# Patient Record
Sex: Female | Born: 1960 | Race: White | Hispanic: No | Smoking: Never smoker
Health system: Southern US, Community
[De-identification: ages and names within clinical notes are randomized; demographics above are authoritative.]

## PROBLEM LIST (undated history)

## (undated) DIAGNOSIS — K589 Irritable bowel syndrome without diarrhea: Secondary | ICD-10-CM

## (undated) DIAGNOSIS — F32A Depression, unspecified: Secondary | ICD-10-CM

## (undated) DIAGNOSIS — N971 Female infertility of tubal origin: Secondary | ICD-10-CM

## (undated) DIAGNOSIS — N760 Acute vaginitis: Secondary | ICD-10-CM

## (undated) DIAGNOSIS — J302 Other seasonal allergic rhinitis: Secondary | ICD-10-CM

## (undated) DIAGNOSIS — F4310 Post-traumatic stress disorder, unspecified: Secondary | ICD-10-CM

## (undated) DIAGNOSIS — R519 Headache, unspecified: Secondary | ICD-10-CM

## (undated) DIAGNOSIS — F909 Attention-deficit hyperactivity disorder, unspecified type: Secondary | ICD-10-CM

## (undated) DIAGNOSIS — R51 Headache: Secondary | ICD-10-CM

## (undated) DIAGNOSIS — T7840XA Allergy, unspecified, initial encounter: Secondary | ICD-10-CM

## (undated) DIAGNOSIS — Z5189 Encounter for other specified aftercare: Secondary | ICD-10-CM

## (undated) DIAGNOSIS — F419 Anxiety disorder, unspecified: Secondary | ICD-10-CM

## (undated) DIAGNOSIS — F329 Major depressive disorder, single episode, unspecified: Secondary | ICD-10-CM

## (undated) DIAGNOSIS — B9689 Other specified bacterial agents as the cause of diseases classified elsewhere: Secondary | ICD-10-CM

## (undated) DIAGNOSIS — G2581 Restless legs syndrome: Secondary | ICD-10-CM

## (undated) DIAGNOSIS — G709 Myoneural disorder, unspecified: Secondary | ICD-10-CM

## (undated) HISTORY — PX: APPENDECTOMY: SHX54

## (undated) HISTORY — DX: Headache: R51

## (undated) HISTORY — DX: Anxiety disorder, unspecified: F41.9

## (undated) HISTORY — DX: Attention-deficit hyperactivity disorder, unspecified type: F90.9

## (undated) HISTORY — DX: Allergy, unspecified, initial encounter: T78.40XA

## (undated) HISTORY — PX: ABDOMINAL HYSTERECTOMY: SHX81

## (undated) HISTORY — DX: Other seasonal allergic rhinitis: J30.2

## (undated) HISTORY — DX: Other specified bacterial agents as the cause of diseases classified elsewhere: N76.0

## (undated) HISTORY — DX: Irritable bowel syndrome, unspecified: K58.9

## (undated) HISTORY — PX: BACK SURGERY: SHX140

## (undated) HISTORY — DX: Other specified bacterial agents as the cause of diseases classified elsewhere: B96.89

## (undated) HISTORY — DX: Headache, unspecified: R51.9

## (undated) HISTORY — PX: TUBAL LIGATION: SHX77

## (undated) HISTORY — DX: Major depressive disorder, single episode, unspecified: F32.9

## (undated) HISTORY — DX: Restless legs syndrome: G25.81

## (undated) HISTORY — DX: Depression, unspecified: F32.A

## (undated) HISTORY — DX: Encounter for other specified aftercare: Z51.89

## (undated) HISTORY — PX: HERNIA MESH REMOVAL: SHX1745

---

## 1999-03-02 ENCOUNTER — Encounter: Payer: Self-pay | Admitting: Emergency Medicine

## 1999-03-02 ENCOUNTER — Emergency Department (HOSPITAL_COMMUNITY): Admission: EM | Admit: 1999-03-02 | Discharge: 1999-03-02 | Payer: Self-pay | Admitting: Emergency Medicine

## 2001-07-28 ENCOUNTER — Encounter (HOSPITAL_COMMUNITY): Admission: RE | Admit: 2001-07-28 | Discharge: 2001-08-27 | Payer: Self-pay | Admitting: Preventative Medicine

## 2001-08-28 ENCOUNTER — Encounter: Payer: Self-pay | Admitting: Preventative Medicine

## 2001-08-28 ENCOUNTER — Ambulatory Visit (HOSPITAL_COMMUNITY): Admission: RE | Admit: 2001-08-28 | Discharge: 2001-08-28 | Payer: Self-pay | Admitting: Preventative Medicine

## 2001-11-16 ENCOUNTER — Emergency Department (HOSPITAL_COMMUNITY): Admission: EM | Admit: 2001-11-16 | Discharge: 2001-11-16 | Payer: Self-pay | Admitting: Emergency Medicine

## 2002-03-31 ENCOUNTER — Emergency Department (HOSPITAL_COMMUNITY): Admission: EM | Admit: 2002-03-31 | Discharge: 2002-03-31 | Payer: Self-pay | Admitting: Emergency Medicine

## 2002-08-23 ENCOUNTER — Encounter (HOSPITAL_COMMUNITY): Admission: RE | Admit: 2002-08-23 | Discharge: 2002-09-22 | Payer: Self-pay | Admitting: Preventative Medicine

## 2002-08-31 ENCOUNTER — Ambulatory Visit (HOSPITAL_COMMUNITY): Admission: RE | Admit: 2002-08-31 | Discharge: 2002-08-31 | Payer: Self-pay | Admitting: Preventative Medicine

## 2002-08-31 ENCOUNTER — Encounter: Payer: Self-pay | Admitting: Preventative Medicine

## 2005-07-08 ENCOUNTER — Emergency Department (HOSPITAL_COMMUNITY): Admission: EM | Admit: 2005-07-08 | Discharge: 2005-07-08 | Payer: Self-pay | Admitting: Emergency Medicine

## 2006-05-14 ENCOUNTER — Ambulatory Visit (HOSPITAL_COMMUNITY): Admission: RE | Admit: 2006-05-14 | Discharge: 2006-05-14 | Payer: Self-pay | Admitting: Internal Medicine

## 2006-06-03 ENCOUNTER — Encounter: Admission: RE | Admit: 2006-06-03 | Discharge: 2006-06-03 | Payer: Self-pay | Admitting: Internal Medicine

## 2006-10-06 ENCOUNTER — Emergency Department (HOSPITAL_COMMUNITY): Admission: EM | Admit: 2006-10-06 | Discharge: 2006-10-06 | Payer: Self-pay | Admitting: Emergency Medicine

## 2006-10-30 ENCOUNTER — Emergency Department (HOSPITAL_COMMUNITY): Admission: EM | Admit: 2006-10-30 | Discharge: 2006-10-30 | Payer: Self-pay | Admitting: Emergency Medicine

## 2006-11-19 ENCOUNTER — Encounter: Admission: RE | Admit: 2006-11-19 | Discharge: 2006-11-19 | Payer: Self-pay | Admitting: Internal Medicine

## 2007-04-21 ENCOUNTER — Other Ambulatory Visit: Admission: RE | Admit: 2007-04-21 | Discharge: 2007-04-21 | Payer: Self-pay | Admitting: Obstetrics and Gynecology

## 2007-09-14 ENCOUNTER — Encounter: Admission: RE | Admit: 2007-09-14 | Discharge: 2007-09-14 | Payer: Self-pay | Admitting: Internal Medicine

## 2008-02-05 ENCOUNTER — Emergency Department (HOSPITAL_COMMUNITY): Admission: EM | Admit: 2008-02-05 | Discharge: 2008-02-05 | Payer: Self-pay | Admitting: Emergency Medicine

## 2009-01-19 ENCOUNTER — Emergency Department (HOSPITAL_COMMUNITY): Admission: EM | Admit: 2009-01-19 | Discharge: 2009-01-19 | Payer: Self-pay | Admitting: Emergency Medicine

## 2010-05-27 ENCOUNTER — Encounter: Payer: Self-pay | Admitting: Internal Medicine

## 2012-06-01 ENCOUNTER — Encounter (HOSPITAL_BASED_OUTPATIENT_CLINIC_OR_DEPARTMENT_OTHER): Payer: Self-pay | Admitting: *Deleted

## 2012-06-03 NOTE — H&P (Signed)
[facility] [address]   [address] [place], [place] [address] [phone] A Division of [facility]  Loreta Ave, M.D.   Robert A. Thurston Hole, M.D.   Burnell Blanks, M.D.   Eulas Post, M.D.   Lunette Stands, M.D Buford Dresser, M.D.  Charlsie Quest, M.D.   Estell Harpin, M.D.   Melina Fiddler, M.D. Genene Churn. Barry Dienes, PA-C            Kirstin A. Shepperson, PA-C Josh [place], PA-C [place], [place]   RE: Christine Douglas, [place]                                [phone]      DOB: 11-07-1960 PROGRESS NOTE: 01-28-12 Azilee comes in for evaluation and treatment recommendation and consultation for continued ongoing symptoms with her left shoulder.  Seen and followed by Dr. Farris Has.  Sent to me today for evaluation and potential operative intervention for definitive treatment.  Patient has a history going on for almost ten years of off and on symptoms in her left shoulder.  Initially would come and go, responded to conservative treatment.  Her symptoms however have become more and more consistent and on a daily basis with more functional impact.  Starting to get some rest pain and night pain.  Initial treatment including injection helpful, but her last shot only helped for a very short time, but it was helpful with Marcaine in place.  She is left hand dominant.  She has had to stop lifting weights, playing tennis and doing almost everything with her right arm because of continued symptoms.  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 and this is starting to impact her work as well.  Her previous workup has been completed and I have reviewed all of this.  Her x-rays show a Type II acromion.  Reasonable subacromial space.  Good glenohumeral joint.  Some changes AC joint, not too extreme, but present.  Her MRI shows moderate tendinopathy rotator cuff with interstitial tearing, as well as articular surface tear involving the anterior attachment region  of the supraspinatus.  I don't see a full thickness tear.  Glenoid intact.  Picture of impingement.  All of these have been reviewed and discussed with her.   Her entire history is reviewed, updated and included in the chart, as is her general exam.  EXAMINATION: Specifically, this is a healthy appearing 52 year-old female.  I can still get her left shoulder through full motion, although she really does not like going overhead and internal rotation.  Positive impingement.  Positive palms down abduction.  Cuff irritation and weakness.  Biceps intact.  No apprehension or instability.  No demonstrable atrophy.  Neurovascularly intact distally.  Opposite right shoulder has full motion.  Good stability.  No cuff irritation.    X-RAYS: I did get a three view x-ray in our office today.  This again shows the Type 1-2 acromion.  No calcification or other significant findings.  Some changes AC joint.     DISPOSITION: Given the longstanding symptoms and worsening symptoms I think operative intervention is indicated.  This is something she wants to pursue.  Discussed exam under anesthesia, arthroscopy, decompression.  Possible rotator cuff tear depending on the condition of her rotator cuff.  Procedures, risks, benefits, complications and anticipated  postoperative course reviewed with her in detail and she understands.  If we have to fix her cuff this is obviously going to have more of an impact on her return to work.  I have discussed this with her as well and she understands.  I will see her at the time of operative intervention.      Loreta Ave, M.D.   Electronically verified by Loreta Ave, M.D. DFM:jjh D 01-29-12 T 01-31-12

## 2012-06-04 ENCOUNTER — Encounter (HOSPITAL_BASED_OUTPATIENT_CLINIC_OR_DEPARTMENT_OTHER)
Admission: RE | Disposition: A | Discharge status: Home / Self Care | Payer: Self-pay | Source: Ambulatory Visit | Attending: Orthopedic Surgery

## 2012-06-04 ENCOUNTER — Ambulatory Visit (HOSPITAL_BASED_OUTPATIENT_CLINIC_OR_DEPARTMENT_OTHER): Payer: Self-pay | Admitting: Anesthesiology

## 2012-06-04 ENCOUNTER — Encounter (HOSPITAL_BASED_OUTPATIENT_CLINIC_OR_DEPARTMENT_OTHER): Payer: Self-pay | Admitting: Anesthesiology

## 2012-06-04 ENCOUNTER — Encounter (HOSPITAL_BASED_OUTPATIENT_CLINIC_OR_DEPARTMENT_OTHER): Payer: Self-pay

## 2012-06-04 ENCOUNTER — Ambulatory Visit (HOSPITAL_BASED_OUTPATIENT_CLINIC_OR_DEPARTMENT_OTHER)
Admission: RE | Admit: 2012-06-04 | Discharge: 2012-06-04 | Disposition: A | Discharge status: Home / Self Care | Payer: Self-pay | Source: Ambulatory Visit | Attending: Orthopedic Surgery | Admitting: Orthopedic Surgery

## 2012-06-04 DIAGNOSIS — M949 Disorder of cartilage, unspecified: Secondary | ICD-10-CM | POA: Insufficient documentation

## 2012-06-04 DIAGNOSIS — M899 Disorder of bone, unspecified: Secondary | ICD-10-CM | POA: Insufficient documentation

## 2012-06-04 DIAGNOSIS — M25819 Other specified joint disorders, unspecified shoulder: Secondary | ICD-10-CM | POA: Insufficient documentation

## 2012-06-04 DIAGNOSIS — M75110 Incomplete rotator cuff tear or rupture of unspecified shoulder, not specified as traumatic: Secondary | ICD-10-CM | POA: Insufficient documentation

## 2012-06-04 DIAGNOSIS — Z9889 Other specified postprocedural states: Secondary | ICD-10-CM

## 2012-06-04 HISTORY — DX: Myoneural disorder, unspecified: G70.9

## 2012-06-04 HISTORY — PX: SHOULDER ARTHROSCOPY WITH ROTATOR CUFF REPAIR AND SUBACROMIAL DECOMPRESSION: SHX5686

## 2012-06-04 SURGERY — SHOULDER ARTHROSCOPY WITH ROTATOR CUFF REPAIR AND SUBACROMIAL DECOMPRESSION
Anesthesia: General | Site: Shoulder | Laterality: Left | Wound class: Clean

## 2012-06-04 MED ORDER — SUCCINYLCHOLINE CHLORIDE 20 MG/ML IJ SOLN
INTRAMUSCULAR | Status: DC | PRN
  Administered 2012-06-04: 100 mg via INTRAVENOUS

## 2012-06-04 MED ORDER — KETOROLAC TROMETHAMINE 30 MG/ML IJ SOLN
INTRAMUSCULAR | Status: DC | PRN
  Administered 2012-06-04: 30 mg via INTRAVENOUS

## 2012-06-04 MED ORDER — DEXAMETHASONE SODIUM PHOSPHATE 4 MG/ML IJ SOLN
INTRAMUSCULAR | Status: DC | PRN
  Administered 2012-06-04: 10 mg via INTRAVENOUS

## 2012-06-04 MED ORDER — MIDAZOLAM HCL 5 MG/5ML IJ SOLN
INTRAMUSCULAR | Status: DC | PRN
  Administered 2012-06-04: 2 mg via INTRAVENOUS

## 2012-06-04 MED ORDER — FENTANYL CITRATE 0.05 MG/ML IJ SOLN: 50.0000 ug | INTRAMUSCULAR | Status: DC | PRN

## 2012-06-04 MED ORDER — HYDROMORPHONE HCL PF 1 MG/ML IJ SOLN
0.2500 mg | INTRAMUSCULAR | Status: DC | PRN
  Administered 2012-06-04 (×4): 0.5 mg via INTRAVENOUS

## 2012-06-04 MED ORDER — CEFAZOLIN SODIUM-DEXTROSE 2-3 GM-% IV SOLR
2.0000 g | INTRAVENOUS | Status: AC
  Administered 2012-06-04: 2 g via INTRAVENOUS

## 2012-06-04 MED ORDER — OXYCODONE-ACETAMINOPHEN 5-325 MG PO TABS: 1.0000 | ORAL_TABLET | ORAL | Status: DC | PRN

## 2012-06-04 MED ORDER — FENTANYL CITRATE 0.05 MG/ML IJ SOLN
INTRAMUSCULAR | Status: DC | PRN
  Administered 2012-06-04: 50 ug via INTRAVENOUS
  Administered 2012-06-04: 100 ug via INTRAVENOUS
  Administered 2012-06-04 (×2): 25 ug via INTRAVENOUS

## 2012-06-04 MED ORDER — BUPIVACAINE HCL (PF) 0.5 % IJ SOLN
INTRAMUSCULAR | Status: DC | PRN
  Administered 2012-06-04: 7900 mL

## 2012-06-04 MED ORDER — MIDAZOLAM HCL 2 MG/2ML IJ SOLN: 1.0000 mg | INTRAMUSCULAR | Status: DC | PRN

## 2012-06-04 MED ORDER — PROPOFOL 10 MG/ML IV BOLUS
INTRAVENOUS | Status: DC | PRN
  Administered 2012-06-04: 150 mg via INTRAVENOUS

## 2012-06-04 MED ORDER — OXYCODONE HCL 5 MG PO TABS
5.0000 mg | ORAL_TABLET | Freq: Once | ORAL | Status: AC | PRN
  Administered 2012-06-04: 5 mg via ORAL

## 2012-06-04 MED ORDER — SODIUM CHLORIDE 0.9 % IR SOLN
Status: DC | PRN
  Administered 2012-06-04: 7900 mL

## 2012-06-04 MED ORDER — ONDANSETRON HCL 4 MG/2ML IJ SOLN: 4.0000 mg | Freq: Once | INTRAMUSCULAR | Status: DC | PRN

## 2012-06-04 MED ORDER — MEPERIDINE HCL 25 MG/ML IJ SOLN: 6.2500 mg | INTRAMUSCULAR | Status: DC | PRN

## 2012-06-04 MED ORDER — LIDOCAINE HCL (CARDIAC) 20 MG/ML IV SOLN
INTRAVENOUS | Status: DC | PRN
  Administered 2012-06-04: 60 mg via INTRAVENOUS

## 2012-06-04 MED ORDER — OXYCODONE HCL 5 MG/5ML PO SOLN: 5.0000 mg | Freq: Once | ORAL | Status: AC | PRN

## 2012-06-04 MED ORDER — ONDANSETRON HCL 4 MG/2ML IJ SOLN
INTRAMUSCULAR | Status: DC | PRN
  Administered 2012-06-04: 4 mg via INTRAVENOUS

## 2012-06-04 MED ORDER — ONDANSETRON 8 MG PO TBDP
8.0000 mg | ORAL_TABLET | Freq: Once | ORAL | Status: AC
  Administered 2012-06-04: 8 mg via ORAL

## 2012-06-04 MED ORDER — LACTATED RINGERS IV SOLN
INTRAVENOUS | Status: DC
  Administered 2012-06-04: 08:00:00 via INTRAVENOUS

## 2012-06-04 SURGICAL SUPPLY — 73 items
ANCH SUT SWLK 19.1X5.5 CLS EL (Anchor) ×2 IMPLANT
ANCHOR PEEK SWIVEL LOCK 5.5 (Anchor) ×2 IMPLANT
APL SKNCLS STERI-STRIP NONHPOA (GAUZE/BANDAGES/DRESSINGS)
BENZOIN TINCTURE PRP APPL 2/3 (GAUZE/BANDAGES/DRESSINGS) IMPLANT
BLADE CUTTER GATOR 3.5 (BLADE) ×2 IMPLANT
BLADE CUTTER MENIS 5.5 (BLADE) IMPLANT
BLADE GREAT WHITE 4.2 (BLADE) ×2 IMPLANT
BLADE SURG 15 STRL LF DISP TIS (BLADE) IMPLANT
BLADE SURG 15 STRL SS (BLADE)
BUR OVAL 6.0 (BURR) ×2 IMPLANT
CANISTER OMNI JUG 16 LITER (MISCELLANEOUS) ×2 IMPLANT
CANISTER SUCTION 2500CC (MISCELLANEOUS) IMPLANT
CANNULA DRY DOC 8X75 (CANNULA) ×1 IMPLANT
CANNULA TWIST IN 8.25X7CM (CANNULA) IMPLANT
CLOTH BEACON ORANGE TIMEOUT ST (SAFETY) ×2 IMPLANT
DECANTER SPIKE VIAL GLASS SM (MISCELLANEOUS) IMPLANT
DRAPE OEC MINIVIEW 54X84 (DRAPES) IMPLANT
DRAPE STERI 35X30 U-POUCH (DRAPES) ×2 IMPLANT
DRAPE U-SHAPE 47X51 STRL (DRAPES) ×2 IMPLANT
DRAPE U-SHAPE 76X120 STRL (DRAPES) ×4 IMPLANT
DRSG PAD ABDOMINAL 8X10 ST (GAUZE/BANDAGES/DRESSINGS) ×2 IMPLANT
DURAPREP 26ML APPLICATOR (WOUND CARE) ×2 IMPLANT
ELECT MENISCUS 165MM 90D (ELECTRODE) ×2 IMPLANT
ELECT NEEDLE TIP 2.8 STRL (NEEDLE) IMPLANT
ELECT REM PT RETURN 9FT ADLT (ELECTROSURGICAL) ×2
ELECTRODE REM PT RTRN 9FT ADLT (ELECTROSURGICAL) ×1 IMPLANT
GAUZE XEROFORM 1X8 LF (GAUZE/BANDAGES/DRESSINGS) ×2 IMPLANT
GLOVE BIOGEL M STRL SZ7.5 (GLOVE) ×1 IMPLANT
GLOVE BIOGEL PI IND STRL 8 (GLOVE) ×2 IMPLANT
GLOVE BIOGEL PI INDICATOR 8 (GLOVE) ×2
GLOVE ORTHO TXT STRL SZ7.5 (GLOVE) ×4 IMPLANT
GOWN PREVENTION PLUS XLARGE (GOWN DISPOSABLE) ×2 IMPLANT
GOWN STRL REIN 2XL XLG LVL4 (GOWN DISPOSABLE) ×2 IMPLANT
IV NS IRRIG 3000ML ARTHROMATIC (IV SOLUTION) ×5 IMPLANT
NDL SCORPION MULTI FIRE (NEEDLE) IMPLANT
NDL SUT 6 .5 CRC .975X.05 MAYO (NEEDLE) IMPLANT
NEEDLE MAYO TAPER (NEEDLE)
NEEDLE SCORPION MULTI FIRE (NEEDLE) ×2 IMPLANT
NS IRRIG 1000ML POUR BTL (IV SOLUTION) IMPLANT
PACK ARTHROSCOPY DSU (CUSTOM PROCEDURE TRAY) ×2 IMPLANT
PACK BASIN DAY SURGERY FS (CUSTOM PROCEDURE TRAY) ×2 IMPLANT
PASSER SUT SWANSON 36MM LOOP (INSTRUMENTS) IMPLANT
PENCIL BUTTON HOLSTER BLD 10FT (ELECTRODE) ×2 IMPLANT
SET ARTHROSCOPY TUBING (MISCELLANEOUS) ×2
SET ARTHROSCOPY TUBING LN (MISCELLANEOUS) ×1 IMPLANT
SLEEVE SCD COMPRESS KNEE MED (MISCELLANEOUS) IMPLANT
SLING ARM FOAM STRAP LRG (SOFTGOODS) IMPLANT
SLING ARM FOAM STRAP MED (SOFTGOODS) IMPLANT
SLING ARM FOAM STRAP XLG (SOFTGOODS) IMPLANT
SLING ARM IMMOBILIZER LRG (SOFTGOODS) IMPLANT
SLING ARM IMMOBILIZER MED (SOFTGOODS) ×1 IMPLANT
SPONGE GAUZE 4X4 12PLY (GAUZE/BANDAGES/DRESSINGS) ×4 IMPLANT
SPONGE LAP 4X18 X RAY DECT (DISPOSABLE) IMPLANT
STRIP CLOSURE SKIN 1/2X4 (GAUZE/BANDAGES/DRESSINGS) IMPLANT
SUCTION FRAZIER TIP 10 FR DISP (SUCTIONS) IMPLANT
SUT ETHIBOND 2 OS 4 DA (SUTURE) IMPLANT
SUT ETHILON 2 0 FS 18 (SUTURE) IMPLANT
SUT ETHILON 3 0 PS 1 (SUTURE) ×2 IMPLANT
SUT FIBERWIRE #2 38 T-5 BLUE (SUTURE)
SUT RETRIEVER MED (INSTRUMENTS) IMPLANT
SUT STEEL 4 (SUTURE) IMPLANT
SUT STEEL 5 (SUTURE) IMPLANT
SUT TIGER TAPE 7 IN WHITE (SUTURE) ×1 IMPLANT
SUT VIC AB 0 CT1 27 (SUTURE)
SUT VIC AB 0 CT1 27XBRD ANBCTR (SUTURE) IMPLANT
SUT VIC AB 2-0 SH 27 (SUTURE)
SUT VIC AB 2-0 SH 27XBRD (SUTURE) IMPLANT
SUT VIC AB 3-0 FS2 27 (SUTURE) IMPLANT
SUTURE FIBERWR #2 38 T-5 BLUE (SUTURE) IMPLANT
TAPE FIBER 2MM 7IN #2 BLUE (SUTURE) ×1 IMPLANT
TOWEL OR 17X24 6PK STRL BLUE (TOWEL DISPOSABLE) ×2 IMPLANT
WATER STERILE IRR 1000ML POUR (IV SOLUTION) ×2 IMPLANT
YANKAUER SUCT BULB TIP NO VENT (SUCTIONS) IMPLANT

## 2012-06-04 NOTE — Anesthesia Postprocedure Evaluation (Signed)
Anesthesia Post Note  Patient: Christine Douglas  Procedure(s) Performed: Procedure(s) (LRB): SHOULDER ARTHROSCOPY WITH ROTATOR CUFF REPAIR AND SUBACROMIAL DECOMPRESSION (Left)  Anesthesia type: general  Patient location: PACU  Post pain: Pain level controlled  Post assessment: Patient's Cardiovascular Status Stable  Last Vitals:  Filed Vitals:   06/04/12 1145  BP: 109/78  Pulse: 74  Temp: 36.6 C  Resp: 16    Post vital signs: Reviewed and stable  Level of consciousness: sedated  Complications: No apparent anesthesia complications

## 2012-06-04 NOTE — Brief Op Note (Signed)
06/04/2012  1:44 PM  PATIENT:  Gayla Doss  52 y.o. female  PRE-OPERATIVE DIAGNOSIS:  LEFT SHOULDER IMPINGEMENT SYNDROME, DEGENERATIVE ARTHRITIS -SHOULDER, A/C JOINT, COMPLETE RUPTURE OF ROTATOR CUFF  POST-OPERATIVE DIAGNOSIS:  Left Shoulder Impingement, Rotator Cuff Tear  PROCEDURE:  Procedure(s) (LRB) with comments: SHOULDER ARTHROSCOPY WITH ROTATOR CUFF REPAIR AND SUBACROMIAL DECOMPRESSION (Left) - LEFT ARTHROSCOPY SHOULDER DECOMPRESSION SUBACROMIAL PARTIAL ACROMIOPLASTY WITH CORACOACROMIAL RELEASE, DISTAL CLAVICULECTOMY,  ROTATOR CUFF REPAIR   SURGEON:  Surgeon(s) and Role:    * Loreta Ave, MD - Primary  PHYSICIAN ASSISTANT: Zonia Kief M   ANESTHESIA:   general  EBL:  Total I/O In: 2462 [P.O.:462; I.V.:2000] Out: -    SPECIMEN:  No Specimen  DISPOSITION OF SPECIMEN:  N/A  COUNTS:  YES  TOURNIQUET:  * No tourniquets in log *   PATIENT DISPOSITION:  PACU - hemodynamically stable.

## 2012-06-04 NOTE — Interval H&P Note (Signed)
History and Physical Interval Note:  06/04/2012 7:33 AM  Christine Douglas  has presented today for surgery, with the diagnosis of LEFT SHOULDER IMPINGEMENT SYNDROME, DEGENERATIVE ARTHRITIS -SHOULDER, A/C JOINT, COMPLETE RUPTURE OF ROTATOR CUFF  The various methods of treatment have been discussed with the patient and family. After consideration of risks, benefits and other options for treatment, the patient has consented to  Procedure(s) (LRB) with comments: SHOULDER ARTHROSCOPY WITH ROTATOR CUFF REPAIR AND SUBACROMIAL DECOMPRESSION (Left) - LEFT ARTHROSCOPY SHOULDER DECOMPRESSION SUBACROMIAL PARTIAL ACROMIOPLASTY WITH CORACOACROMIAL RELEASE, ARTHROSCOPY SHOULDER DISTAL CLAVICULECTOMY, ARTHROSCOPY SHOULDER WITH ROTATOR CUFF REPAIR ANESTHESIA: GENERAL, PRE/POST OP SCALENE as a surgical intervention .  The patient's history has been reviewed, patient examined, no change in status, stable for surgery.  I have reviewed the patient's chart and labs.  Questions were answered to the patient's satisfaction.     Blayke Pinera F

## 2012-06-04 NOTE — Anesthesia Procedure Notes (Signed)
Procedure Name: Intubation Performed by: Zachery Niswander W Pre-anesthesia Checklist: Patient identified, Timeout performed, Emergency Drugs available, Suction available and Patient being monitored Patient Re-evaluated:Patient Re-evaluated prior to inductionOxygen Delivery Method: Circle system utilized Preoxygenation: Pre-oxygenation with 100% oxygen Intubation Type: IV induction Ventilation: Mask ventilation without difficulty Laryngoscope Size: Miller and 2 Grade View: Grade I Tube type: Oral Tube size: 7.0 mm Number of attempts: 1 Airway Equipment and Method: Stylet Placement Confirmation: ETT inserted through vocal cords under direct vision,  breath sounds checked- equal and bilateral and positive ETCO2 Secured at: 22 cm Tube secured with: Tape Dental Injury: Teeth and Oropharynx as per pre-operative assessment      

## 2012-06-04 NOTE — Transfer of Care (Signed)
Immediate Anesthesia Transfer of Care Note  Patient: Elainna P Brown  Procedure(s) Performed: Procedure(s) (LRB) with comments: SHOULDER ARTHROSCOPY WITH ROTATOR CUFF REPAIR AND SUBACROMIAL DECOMPRESSION (Left) - LEFT ARTHROSCOPY SHOULDER DECOMPRESSION SUBACROMIAL PARTIAL ACROMIOPLASTY WITH CORACOACROMIAL RELEASE, DISTAL CLAVICULECTOMY,  ROTATOR CUFF REPAIR   Patient Location: PACU  Anesthesia Type:General  Level of Consciousness: awake, alert  and oriented  Airway & Oxygen Therapy: Patient Spontanous Breathing and Patient connected to face mask oxygen  Post-op Assessment: Report given to PACU RN and Post -op Vital signs reviewed and stable  Post vital signs: Reviewed and stable  Complications: No apparent anesthesia complications

## 2012-06-04 NOTE — Anesthesia Preprocedure Evaluation (Addendum)
Anesthesia Evaluation  Patient identified by MRN, date of birth, ID band Patient awake    Reviewed: Allergy & Precautions, H&P , NPO status , Patient's Chart, lab work & pertinent test results  Airway Mallampati: I TM Distance: >3 FB Neck ROM: Full    Dental   Pulmonary          Cardiovascular     Neuro/Psych    GI/Hepatic   Endo/Other    Renal/GU      Musculoskeletal   Abdominal   Peds  Hematology   Anesthesia Other Findings   Reproductive/Obstetrics                           Anesthesia Physical Anesthesia Plan  ASA: II  Anesthesia Plan: General   Post-op Pain Management:    Induction: Intravenous  Airway Management Planned: Oral ETT  Additional Equipment:   Intra-op Plan:   Post-operative Plan: Extubation in OR  Informed Consent: I have reviewed the patients History and Physical, chart, labs and discussed the procedure including the risks, benefits and alternatives for the proposed anesthesia with the patient or authorized representative who has indicated his/her understanding and acceptance.     Plan Discussed with: CRNA and Surgeon  Anesthesia Plan Comments: (Pt preferred no ISB. Will proceed with GA and IV pain control.)       Anesthesia Quick Evaluation

## 2012-06-05 ENCOUNTER — Encounter (HOSPITAL_BASED_OUTPATIENT_CLINIC_OR_DEPARTMENT_OTHER): Payer: Self-pay | Admitting: Orthopedic Surgery

## 2012-06-05 NOTE — Op Note (Signed)
NAMESOLIYANA, MCCHRISTIAN NO.:  [id number]  MEDICAL RECORD NO.:  [id number]  LOCATION:                                 FACILITY:  PHYSICIAN:  Loreta Ave, M.D. DATE OF BIRTH:  1961-04-29  DATE OF PROCEDURE:  06/04/2012 DATE OF DISCHARGE:                              OPERATIVE REPORT   PREOPERATIVE DIAGNOSES:  Left shoulder impingement, distal clavicle osteolysis, partial rotator cuff tear.  POSTOPERATIVE DIAGNOSIS:  Left shoulder impingement, distal clavicle osteolysis, partial rotator cuff tear with functional full-thickness tear supraspinatus tendon.  Tearing superior labrum.  PROCEDURE:  Left shoulder exam under anesthesia, arthroscopy. Debridement labrum.  Debrided mobilization rotator cuff.  Arthroscopic- assisted rotator cuff repair, FiberWire x2, swivel lock anchors x2. Bursectomy, acromioplasty, CA ligament release.  Excision of distal clavicle.  SURGEON:  Loreta Ave, MD  ASSISTANT:  Genene Churn. Barry Dienes, PA  ANESTHESIA:  General.  BLOOD LOSS:  Minimal.  SPECIMEN:  None.  CULTURES:  None.  COMPLICATIONS:  None.  DRESSINGS:  Soft compressive shoulder immobilizer.  PROCEDURE:  The patient brought to the operating room, placed on the operating table in supine position.  After adequate anesthesia had been obtained, left shoulder examined.  Full motion and stable shoulder. Placed in beach-chair position on the shoulder positioner, prepped and draped in usual sterile fashion.  Three portals, anterior, posterior, lateral.  Arthroscope introduced, shoulder distended and inspected. Some fraying of superior labrum debrided.  Biceps tendon, biceps anchor intact.  Articular cartilage looked good.  Undersurface supraspinatus did not have an obvious tear but there was hyperemia throughout the crescent region.  Cannula redirected subacromially.  Functional full- thickness tear, entire supraspinatus crescent region, down to the capsule.  Mobilized and  debrided for repair.  Tuberosity roughened. Type 2 acromion.  Acromioplasty to a type 1 acromion releasing CA ligament.  Bursa resected.  Grade 4 changes of AC joint.  Periarticular spurs lateral cm of clavicle resected.  With a cannula in the lateral portal, the cuff was captured with 2 horizontal mattress sutures with scorpion device.  Anchored down the tuberosity with 2 SwiveLock anchors. Nice firm watertight closure achieved.  Adequacy of decompression and cuff repair confirmed viewing from all portals.  Instruments and fluids removed.  Portals closed with nylon.  Sterile compressive dressing applied.  Shoulder mobilizer applied.  Anesthesia reversed.  Brought to the recovery room.  Tolerated surgery well.  No complications.     Loreta Ave, M.D.     DFM/MEDQ  D:  06/04/2012  T:  06/04/2012  Job:  [id number]

## 2012-09-04 ENCOUNTER — Other Ambulatory Visit: Payer: Self-pay | Admitting: Neurological Surgery

## 2012-09-04 DIAGNOSIS — M5412 Radiculopathy, cervical region: Secondary | ICD-10-CM

## 2012-09-07 ENCOUNTER — Ambulatory Visit
Admission: RE | Admit: 2012-09-07 | Discharge: 2012-09-07 | Disposition: A | Discharge status: Home / Self Care | Payer: BC Managed Care – PPO | Source: Ambulatory Visit | Attending: Neurological Surgery | Admitting: Neurological Surgery

## 2012-09-07 DIAGNOSIS — M5412 Radiculopathy, cervical region: Secondary | ICD-10-CM

## 2012-10-30 ENCOUNTER — Encounter: Payer: Self-pay | Admitting: *Deleted

## 2012-11-02 ENCOUNTER — Ambulatory Visit (INDEPENDENT_AMBULATORY_CARE_PROVIDER_SITE_OTHER): Payer: BC Managed Care – PPO | Admitting: Adult Health

## 2012-11-02 ENCOUNTER — Encounter: Payer: Self-pay | Admitting: Adult Health

## 2012-11-02 VITALS — BP 120/70 | Ht 64.0 in | Wt 116.0 lb

## 2012-11-02 DIAGNOSIS — N898 Other specified noninflammatory disorders of vagina: Secondary | ICD-10-CM

## 2012-11-02 DIAGNOSIS — N760 Acute vaginitis: Secondary | ICD-10-CM

## 2012-11-02 DIAGNOSIS — A499 Bacterial infection, unspecified: Secondary | ICD-10-CM

## 2012-11-02 DIAGNOSIS — B9689 Other specified bacterial agents as the cause of diseases classified elsewhere: Secondary | ICD-10-CM

## 2012-11-02 LAB — POCT WET PREP (WET MOUNT)

## 2012-11-02 MED ORDER — METRONIDAZOLE 0.75 % VA GEL: VAGINAL | Status: DC

## 2012-11-02 NOTE — Progress Notes (Signed)
Subjective:     Patient ID: Christine Douglas, female   DOB: 11/25/1960, 52 y.o.   MRN: [id number]  HPI Christine Douglas is a 52 year old white female complaining of vaginal discharge and odor and some discomfort with sex.  Review of Systems Positives in HPI Reviewed past medical,surgical, social and family history. Reviewed medications and allergies.     Objective:   Physical Exam BP 120/70  Ht 5\' 4"  (1.626 m)  Wt 116 lb (52.617 kg)  BMI 19.9 kg/m2 Skin warm and dry.Pelvic: external genitalia is normal in appearance, vagina: white discharge without odor, cervix and uterus absent, adnexa: no masses or tenderness noted. Wet prep: + for clue cells and +WBCs. GC/CHL obtained.      Assessment:     BV    Plan:      Rx Metrogel 1 applicator in vagina at hs x 5 nites and prn with 1 refill   Follow up with physical in 4 weeks Review handout on BV

## 2012-11-02 NOTE — Patient Instructions (Addendum)
Bacterial Vaginosis Bacterial vaginosis (BV) is a vaginal infection where the normal balance of bacteria in the vagina is disrupted. The normal balance is then replaced by an overgrowth of certain bacteria. There are several different kinds of bacteria that can cause BV. BV is the most common vaginal infection in women of childbearing age. CAUSES   The cause of BV is not fully understood. BV develops when there is an increase or imbalance of harmful bacteria.  Some activities or behaviors can upset the normal balance of bacteria in the vagina and put women at increased risk including:  Having a new sex partner or multiple sex partners.  Douching.  Using an intrauterine device (IUD) for contraception.  It is not clear what role sexual activity plays in the development of BV. However, women that have never had sexual intercourse are rarely infected with BV. Women do not get BV from toilet seats, bedding, swimming pools or from touching objects around them.  SYMPTOMS   Grey vaginal discharge.  A fish-like odor with discharge, especially after sexual intercourse.  Itching or burning of the vagina and vulva.  Burning or pain with urination.  Some women have no signs or symptoms at all. DIAGNOSIS  Your caregiver must examine the vagina for signs of BV. Your caregiver will perform lab tests and look at the sample of vaginal fluid through a microscope. They will look for bacteria and abnormal cells (clue cells), a pH test higher than 4.5, and a positive amine test all associated with BV.  RISKS AND COMPLICATIONS   Pelvic inflammatory disease (PID).  Infections following gynecology surgery.  Developing HIV.  Developing herpes virus. TREATMENT  Sometimes BV will clear up without treatment. However, all women with symptoms of BV should be treated to avoid complications, especially if gynecology surgery is planned. Female partners generally do not need to be treated. However, BV may spread  between female sex partners so treatment is helpful in preventing a recurrence of BV.   BV may be treated with antibiotics. The antibiotics come in either pill or vaginal cream forms. Either can be used with nonpregnant or pregnant women, but the recommended dosages differ. These antibiotics are not harmful to the baby.  BV can recur after treatment. If this happens, a second round of antibiotics will often be prescribed.  Treatment is important for pregnant women. If not treated, BV can cause a premature delivery, especially for a pregnant woman who had a premature birth in the past. All pregnant women who have symptoms of BV should be checked and treated.  For chronic reoccurrence of BV, treatment with a type of prescribed gel vaginally twice a week is helpful. HOME CARE INSTRUCTIONS   Finish all medication as directed by your caregiver.  Do not have sex until treatment is completed.  Tell your sexual partner that you have a vaginal infection. They should see their caregiver and be treated if they have problems, such as a mild rash or itching.  Practice safe sex. Use condoms. Only have 1 sex partner. PREVENTION  Basic prevention steps can help reduce the risk of upsetting the natural balance of bacteria in the vagina and developing BV:  Do not have sexual intercourse (be abstinent).  Do not douche.  Use all of the medicine prescribed for treatment of BV, even if the signs and symptoms go away.  Tell your sex partner if you have BV. That way, they can be treated, if needed, to prevent reoccurrence. SEEK MEDICAL CARE IF:     Your symptoms are not improving after 3 days of treatment.  You have increased discharge, pain, or fever. MAKE SURE YOU:   Understand these instructions.  Will watch your condition.  Will get help right away if you are not doing well or get worse. FOR MORE INFORMATION  Division of STD Prevention (DSTDP), Centers for Disease Control and Prevention:  [employer]Apps.co.za American Social Health Association (ASHA): www.ashastd.org  Document Released: 04/22/2005 Document Revised: 07/15/2011 Document Reviewed: 10/13/2008 [facility] Patient Information 2014 [place], [place]. Return in 4 weeks for physical

## 2012-11-03 LAB — GC/CHLAMYDIA PROBE AMP
CT Probe RNA: NEGATIVE
GC Probe RNA: NEGATIVE

## 2012-12-04 ENCOUNTER — Other Ambulatory Visit: Payer: BC Managed Care – PPO | Admitting: Adult Health

## 2013-01-25 ENCOUNTER — Encounter: Payer: Self-pay | Admitting: *Deleted

## 2013-01-25 ENCOUNTER — Other Ambulatory Visit: Payer: BC Managed Care – PPO | Admitting: Adult Health

## 2013-01-26 ENCOUNTER — Encounter (HOSPITAL_COMMUNITY): Payer: Self-pay

## 2013-01-26 ENCOUNTER — Emergency Department (HOSPITAL_COMMUNITY)
Admission: EM | Admit: 2013-01-26 | Discharge: 2013-01-26 | Disposition: A | Discharge status: Home / Self Care | Payer: BC Managed Care – PPO | Attending: Emergency Medicine | Admitting: Emergency Medicine

## 2013-01-26 DIAGNOSIS — Z8742 Personal history of other diseases of the female genital tract: Secondary | ICD-10-CM | POA: Insufficient documentation

## 2013-01-26 DIAGNOSIS — Z8669 Personal history of other diseases of the nervous system and sense organs: Secondary | ICD-10-CM | POA: Insufficient documentation

## 2013-01-26 DIAGNOSIS — N3000 Acute cystitis without hematuria: Secondary | ICD-10-CM | POA: Insufficient documentation

## 2013-01-26 DIAGNOSIS — Z8619 Personal history of other infectious and parasitic diseases: Secondary | ICD-10-CM | POA: Insufficient documentation

## 2013-01-26 DIAGNOSIS — Z79899 Other long term (current) drug therapy: Secondary | ICD-10-CM | POA: Insufficient documentation

## 2013-01-26 LAB — URINALYSIS, ROUTINE W REFLEX MICROSCOPIC
Bilirubin Urine: NEGATIVE
Ketones, ur: NEGATIVE mg/dL
Nitrite: POSITIVE — AB
Urobilinogen, UA: 0.2 mg/dL (ref 0.0–1.0)

## 2013-01-26 MED ORDER — SULFAMETHOXAZOLE-TRIMETHOPRIM 800-160 MG PO TABS: 1.0000 | ORAL_TABLET | Freq: Two times a day (BID) | ORAL | Status: DC

## 2013-01-26 NOTE — ED Notes (Signed)
Pt c/o pressure and burning with urination x 2 days.  Denies abd pain.

## 2013-01-28 LAB — URINE CULTURE

## 2013-01-28 NOTE — ED Provider Notes (Signed)
CSN: [id number]     Arrival date & time 01/26/13  1052 History   First MD Initiated Contact with Patient 01/26/13 1122     Chief Complaint  Patient presents with  . Urinary Tract Infection   (Consider location/radiation/quality/duration/timing/severity/associated sxs/prior Treatment) HPI Comments: Christine Douglas is a 52 y.o. Female presenting with a 2 day history of suprapubic pressure, burning pain with urination and increased frequency of urination.  She has a history of uti's but not in recent years but suspects this is the problem.  She denies fevers or chills, nausea, vomiting, back or abdominal pain and has no vaginal discharge.  She has started taking an otc pyridium product which has not relieved her symptoms.     The history is provided by the patient.    Past Medical History  Diagnosis Date  . Neuromuscular disorder     nerve damage C6  C7  . BV (bacterial vaginosis)    Past Surgical History  Procedure Laterality Date  . Cesarean section    . Abdominal hysterectomy      partial hysterectomy  . Shoulder arthroscopy with rotator cuff repair and subacromial decompression  06/04/2012    Procedure: SHOULDER ARTHROSCOPY WITH ROTATOR CUFF REPAIR AND SUBACROMIAL DECOMPRESSION;  Surgeon: Loreta Ave, MD;  Location: [facility];  Service: Orthopedics;  Laterality: Left;  LEFT ARTHROSCOPY SHOULDER DECOMPRESSION SUBACROMIAL PARTIAL ACROMIOPLASTY WITH CORACOACROMIAL RELEASE, DISTAL CLAVICULECTOMY,  ROTATOR CUFF REPAIR    Family History  Problem Relation Age of Onset  . Cancer Other   . Thyroid disease Other    History  Substance Use Topics  . Smoking status: Never Smoker   . Smokeless tobacco: Never Used  . Alcohol Use: Yes     Comment: wine occassionally   OB History   Grav Para Term Preterm Abortions TAB SAB Ect Mult Living   3 3        3      Review of Systems  Constitutional: Negative for fever and chills.  HENT: Negative for congestion, sore throat  and neck pain.   Eyes: Negative.   Respiratory: Negative for chest tightness and shortness of breath.   Cardiovascular: Negative for chest pain.  Gastrointestinal: Negative for nausea and abdominal pain.  Genitourinary: Positive for dysuria, urgency and frequency. Negative for hematuria, flank pain, vaginal discharge, menstrual problem and pelvic pain.  Musculoskeletal: Negative for joint swelling and arthralgias.  Skin: Negative.  Negative for rash and wound.  Neurological: Negative for dizziness, weakness, light-headedness, numbness and headaches.  Psychiatric/Behavioral: Negative.     Allergies  Flexeril  Home Medications   Current Outpatient Rx  Name  Route  Sig  Dispense  Refill  . acetaminophen (TYLENOL) 500 MG tablet   Oral   Take 1,000 mg by mouth every 6 (six) hours as needed for pain.         . Cholecalciferol (VITAMIN D PO)   Oral   Take 1 tablet by mouth daily.         . diazepam (VALIUM) 5 MG tablet   Oral   Take 5 mg by mouth daily as needed for anxiety.         Marland Kitchen HYDROcodone-acetaminophen (NORCO) 10-325 MG per tablet   Oral   Take 1 tablet by mouth daily as needed for pain.         Marland Kitchen sulfamethoxazole-trimethoprim (SEPTRA DS) 800-160 MG per tablet   Oral   Take 1 tablet by mouth every 12 (twelve) hours.  10 tablet   0    BP 111/84  Pulse 64  Temp(Src) 97.7 F (36.5 C) (Oral)  Resp 18  Ht 5\' 4"  (1.626 m)  Wt 110 lb (49.896 kg)  BMI 18.87 kg/m2  SpO2 100% Physical Exam  Nursing note and vitals reviewed. Constitutional: She appears well-developed and well-nourished.  HENT:  Head: Normocephalic and atraumatic.  Eyes: Conjunctivae are normal.  Neck: Normal range of motion.  Cardiovascular: Normal rate, regular rhythm, normal heart sounds and intact distal pulses.   Pulmonary/Chest: Effort normal and breath sounds normal. She has no wheezes.  Abdominal: Soft. Bowel sounds are normal. She exhibits no distension. There is no tenderness.   Musculoskeletal: Normal range of motion.  Neurological: She is alert.  Skin: Skin is warm and dry.  Psychiatric: She has a normal mood and affect.    ED Course  Procedures (including critical care time) Labs Review Labs Reviewed  URINALYSIS, ROUTINE W REFLEX MICROSCOPIC - Abnormal; Notable for the following:    APPearance HAZY (*)    Hgb urine dipstick LARGE (*)    Protein, ur 100 (*)    Nitrite POSITIVE (*)    Leukocytes, UA LARGE (*)    All other components within normal limits  URINE MICROSCOPIC-ADD ON - Abnormal; Notable for the following:    Bacteria, UA MANY (*)    All other components within normal limits  URINE CULTURE   Imaging Review No results found.  MDM   1. Cystitis, acute    Patients labs and/or radiological studies were viewed and considered during the medical decision making and disposition process. Urine cx sent.  Pt prescribed septra, encouraged increased fluid intake.  May continue taking pyridium.  Encouraged recheck for fever, chills, vomiting, worse pain.  Lab visit with pcp after abx complete to ensure infection resolved.  The patient appears reasonably screened and/or stabilized for discharge and I doubt any other medical condition or other [facility] requiring further screening, evaluation, or treatment in the ED at this time prior to discharge.     Burgess Amor, PA-C 01/28/13 2123

## 2013-01-29 ENCOUNTER — Telehealth (HOSPITAL_COMMUNITY): Payer: Self-pay | Admitting: Emergency Medicine

## 2013-01-29 NOTE — ED Notes (Signed)
Post ED Visit - Positive Culture Follow-up  Culture report reviewed by antimicrobial stewardship pharmacist: []  Christine Douglas, Pharm.D., BCPS [x]  Christine Douglas, [address].D., BCPS []  Christine Douglas, Pharm.D., BCPS []  Christine Douglas, Christine Douglas.D., BCPS, AAHIVP []  Christine Douglas, Pharm.D., BCPS, AAHIVP  Positive urine culture Treated with Sulfa-Trimeth, organism sensitive to the same and no further patient follow-up is required at this time.  [name] 01/29/2013, 1:50 PM

## 2013-02-01 NOTE — ED Provider Notes (Signed)
Medical screening examination/treatment/procedure(s) were performed by non-physician practitioner and as supervising physician I was immediately available for consultation/collaboration.   Kristen N Ward, DO 02/01/13 0656 

## 2013-11-24 ENCOUNTER — Encounter: Payer: Self-pay | Admitting: Advanced Practice Midwife

## 2013-11-24 ENCOUNTER — Ambulatory Visit (INDEPENDENT_AMBULATORY_CARE_PROVIDER_SITE_OTHER): Payer: BC Managed Care – PPO | Admitting: Advanced Practice Midwife

## 2013-11-24 VITALS — BP 110/78 | Ht 64.0 in | Wt 129.0 lb

## 2013-11-24 DIAGNOSIS — B9689 Other specified bacterial agents as the cause of diseases classified elsewhere: Secondary | ICD-10-CM

## 2013-11-24 DIAGNOSIS — N760 Acute vaginitis: Secondary | ICD-10-CM

## 2013-11-24 DIAGNOSIS — A499 Bacterial infection, unspecified: Secondary | ICD-10-CM

## 2013-11-24 MED ORDER — METRONIDAZOLE 500 MG PO TABS: 500.0000 mg | ORAL_TABLET | Freq: Two times a day (BID) | ORAL | Status: DC

## 2013-11-24 NOTE — Progress Notes (Signed)
SUBJECTIVE:  53 y.o. female complains of white and malodorous vaginal discharge for 1 week(s). Smells fishy, esp after sex, no irritaiton Denies abnormal vaginal bleeding or significant pelvic pain or fever. No UTI symptoms. Denies history of known exposure to STD.  No LMP recorded. Patient has had a hysterectomy.  OBJECTIVE:  She appears well, afebrile. Abdomen: benign, soft, nontender, no masses. Pelvic Exam: SSE: scant amount of white d/c with amine odor. Very few clues, no WBC yeast or trich.. Urine dipstick: not done.  ASSESSMENT:  bacterial vaginosis, mild  PLAN:  Will treat because of sx Treatment: Flagyl 500 BID x 7 days and abstain from coitus during course of treatment ROV prn if symptoms persist or worsen.

## 2013-11-24 NOTE — Patient Instructions (Signed)
Vaginal probiotic (orally every day or vaginally twice a week) for prevention       Bacterial Vaginosis Bacterial vaginosis is a vaginal infection that occurs when the normal balance of bacteria in the vagina is disrupted. It results from an overgrowth of certain bacteria. This is the most common vaginal infection in women of childbearing age. Treatment is important to prevent complications, especially in pregnant women, as it can cause a premature delivery. CAUSES  Bacterial vaginosis is caused by an increase in harmful bacteria that are normally present in smaller amounts in the vagina. Several different kinds of bacteria can cause bacterial vaginosis. However, the reason that the condition develops is not fully understood. RISK FACTORS Certain activities or behaviors can put you at an increased risk of developing bacterial vaginosis, including:  Having a new sex partner or multiple sex partners.  Douching.  Using an intrauterine device (IUD) for contraception. Women do not get bacterial vaginosis from toilet seats, bedding, swimming pools, or contact with objects around them. SIGNS AND SYMPTOMS  Some women with bacterial vaginosis have no signs or symptoms. Common symptoms include:  Grey vaginal discharge.  A fishlike odor with discharge, especially after sexual intercourse.  Itching or burning of the vagina and vulva.  Burning or pain with urination. DIAGNOSIS  Your health care provider will take a medical history and examine the vagina for signs of bacterial vaginosis. A sample of vaginal fluid may be taken. Your health care provider will look at this sample under a microscope to check for bacteria and abnormal cells. A vaginal pH test may also be done.  TREATMENT  Bacterial vaginosis may be treated with antibiotic medicines. These may be given in the form of a pill or a vaginal cream. A second round of antibiotics may be prescribed if the condition comes back after treatment.    HOME CARE INSTRUCTIONS   Only take over-the-counter or prescription medicines as directed by your health care provider.  If antibiotic medicine was prescribed, take it as directed. Make sure you finish it even if you start to feel better.  Do not have sex until treatment is completed.  Tell all sexual partners that you have a vaginal infection. They should see their health care provider and be treated if they have problems, such as a mild rash or itching.  Practice safe sex by using condoms and only having one sex partner. SEEK MEDICAL CARE IF:   Your symptoms are not improving after 3 days of treatment.  You have increased discharge or pain.  You have a fever. MAKE SURE YOU:   Understand these instructions.  Will watch your condition.  Will get help right away if you are not doing well or get worse. FOR MORE INFORMATION  Centers for Disease Control and Prevention, Division of STD Prevention: SolutionApps.co.zawww.cdc.gov/std American Sexual Health Association (ASHA): www.ashastd.org  Document Released: 04/22/2005 Document Revised: 02/10/2013 Document Reviewed: 12/02/2012 [facility] Patient Information 2015 [place], [place]. This information is not intended to replace advice given to you by your health care provider. Make sure you discuss any questions you have with your health care provider.

## 2013-11-25 ENCOUNTER — Telehealth: Payer: Self-pay | Admitting: Advanced Practice Midwife

## 2013-11-25 NOTE — Telephone Encounter (Signed)
Spoke with pt. Pt states [facility] didn't have Rx. I called pharmacy and gave them a verbal, Flagyl 500 mg # 14 take 1 tab by mouth BID with 2 refills per Drenda FreezeFran, CNM. Pt aware. JSY

## 2014-03-07 ENCOUNTER — Encounter: Payer: Self-pay | Admitting: Advanced Practice Midwife

## 2014-06-17 ENCOUNTER — Ambulatory Visit: Payer: Self-pay | Admitting: Adult Health

## 2014-07-06 ENCOUNTER — Ambulatory Visit: Payer: Self-pay | Admitting: Adult Health

## 2014-08-18 ENCOUNTER — Encounter: Payer: Self-pay | Admitting: Advanced Practice Midwife

## 2014-08-18 ENCOUNTER — Ambulatory Visit (INDEPENDENT_AMBULATORY_CARE_PROVIDER_SITE_OTHER): Payer: BLUE CROSS/BLUE SHIELD | Admitting: Advanced Practice Midwife

## 2014-08-18 VITALS — BP 110/60 | Ht 64.0 in | Wt 131.0 lb

## 2014-08-18 DIAGNOSIS — N390 Urinary tract infection, site not specified: Secondary | ICD-10-CM

## 2014-08-18 LAB — POCT URINALYSIS DIPSTICK
Glucose, UA: NEGATIVE
Ketones, UA: NEGATIVE
Nitrite, UA: POSITIVE
PROTEIN UA: NEGATIVE

## 2014-08-18 MED ORDER — PHENAZOPYRIDINE HCL 200 MG PO TABS
200.0000 mg | ORAL_TABLET | Freq: Three times a day (TID) | ORAL | Status: DC | PRN
Start: 2014-08-18 — End: 2016-06-06

## 2014-08-18 MED ORDER — NITROFURANTOIN MONOHYD MACRO 100 MG PO CAPS: 100.0000 mg | ORAL_CAPSULE | Freq: Two times a day (BID) | ORAL | Status: AC

## 2014-08-18 NOTE — Progress Notes (Signed)
[facility] Visit  Patient name: Christine Douglas MRN [id number]  Date of birth: 1960-11-22  CC & HPI:  Christine Douglas is a 54 y.o. Caucasian female presenting today for C/O dysuria, odor when urinating.  [facility] ~ 6 weeks ago, felt like it never went away.  Had UTI ~ 3 months ago resistant to ? Sulfa. Not sexually active.   Pertinent History Reviewed:  Medical & Surgical Hx:   Past Medical History  Diagnosis Date  . Neuromuscular disorder     nerve damage C6  C7  . BV (bacterial vaginosis)    Past Surgical History  Procedure Laterality Date  . Cesarean section    . Abdominal hysterectomy      partial hysterectomy  . Shoulder arthroscopy with rotator cuff repair and subacromial decompression  06/04/2012    Procedure: SHOULDER ARTHROSCOPY WITH ROTATOR CUFF REPAIR AND SUBACROMIAL DECOMPRESSION;  Surgeon: Loreta Aveaniel F Murphy, MD;  Location: [facility];  Service: Orthopedics;  Laterality: Left;  LEFT ARTHROSCOPY SHOULDER DECOMPRESSION SUBACROMIAL PARTIAL ACROMIOPLASTY WITH CORACOACROMIAL RELEASE, DISTAL CLAVICULECTOMY,  ROTATOR CUFF REPAIR   . Back surgery      spine surgery; 2 screws and plate in neck   Family History  Problem Relation Age of Onset  . Cancer Other   . Hyperlipidemia Mother   . Thyroid disease Mother   . Other Brother     suicide    Current outpatient prescriptions:  .  diazepam (VALIUM) 5 MG tablet, Take 5 mg by mouth daily as needed for anxiety., Disp: , Rfl:  .  ibuprofen (ADVIL,MOTRIN) 200 MG tablet, Take 200 mg by mouth as needed., Disp: , Rfl:  .  nitrofurantoin, macrocrystal-monohydrate, (MACROBID) 100 MG capsule, Take 1 capsule (100 mg total) by mouth 2 (two) times daily., Disp: 14 capsule, Rfl: 0 .  phenazopyridine (PYRIDIUM) 200 MG tablet, Take 1 tablet (200 mg total) by mouth 3 (three) times daily as needed for pain., Disp: 10 tablet, Rfl: 0 Social History: Reviewed -  reports that she has never smoked. She has never used  smokeless tobacco.  Review of Systems:   Constitutional: Negative for fever and chills Eyes: Negative for visual disturbances Respiratory: Negative for shortness of breath, dyspnea Cardiovascular: Negative for chest pain or palpitations  Gastrointestinal: Negative for vomiting, diarrhea and constipation; no abdominal pain Genitourinary: Negative for dysuria and urgency, vaginal irritation or itching Musculoskeletal: Negative for back pain, joint pain, myalgias  Neurological: Negative for dizziness and headaches    Objective Findings:  Vitals: BP 110/60 mmHg  Ht 5\' 4"  (1.626 m)  Wt 131 lb (59.421 kg)  BMI 22.47 kg/m2  Physical Examination:  General appearance - alert, well appearing, and in no distress Mental status - alert, oriented to person, place, and time Abdomen - some tenderness suprapubically Pelvic - SSE:  Normal appearing DC, no odor Extremities - no pedal edema noted  Results for orders placed or performed in visit on 08/18/14 (from the past 24 hour(s))  POCT urinalysis dipstick   Collection Time: 08/18/14 12:11 PM  Result Value Ref Range   Color, UA     Clarity, UA     Glucose, UA neg    Bilirubin, UA     Ketones, UA neg    Spec Grav, UA     Blood, UA trace    pH, UA     Protein, UA neg    Urobilinogen, UA     Nitrite, UA positive  Leukocytes, UA moderate (2+)       Assessment & Plan:  A:   UTI P:  Rx Pyridium and Macrobid  BID X 7   Culture urine   CRESENZO-DISHMAN,Jose Corvin CNM 08/18/2014 12:22 PM

## 2014-08-19 ENCOUNTER — Other Ambulatory Visit: Payer: Self-pay | Admitting: Obstetrics & Gynecology

## 2014-08-19 ENCOUNTER — Telehealth: Payer: Self-pay | Admitting: *Deleted

## 2014-08-19 MED ORDER — SULFAMETHOXAZOLE-TRIMETHOPRIM 800-160 MG PO TABS
1.0000 | ORAL_TABLET | Freq: Two times a day (BID) | ORAL | Status: DC
Start: 2014-08-19 — End: 2015-10-03

## 2014-08-19 MED ORDER — METRONIDAZOLE 0.75 % VA GEL: VAGINAL | Status: DC

## 2014-08-19 NOTE — Telephone Encounter (Signed)
Pt states she thinks she is having an allergic reaction to Macrobid that Drenda FreezeFran gave on 08/18/2014 for UTI, c/o hands and feet numbness with tingling, stomach hard and swollen, nausea, fatigue. Pt also requesting RX for Metronidazole for bacterial infection. Please advise.

## 2014-08-22 LAB — URINE CULTURE

## 2014-12-09 ENCOUNTER — Ambulatory Visit (HOSPITAL_COMMUNITY): Payer: BLUE CROSS/BLUE SHIELD | Attending: Family Medicine | Admitting: Physical Therapy

## 2014-12-09 DIAGNOSIS — M436 Torticollis: Secondary | ICD-10-CM | POA: Diagnosis present

## 2014-12-09 DIAGNOSIS — R293 Abnormal posture: Secondary | ICD-10-CM | POA: Insufficient documentation

## 2014-12-09 DIAGNOSIS — R29898 Other symptoms and signs involving the musculoskeletal system: Secondary | ICD-10-CM | POA: Diagnosis present

## 2014-12-09 DIAGNOSIS — M542 Cervicalgia: Secondary | ICD-10-CM | POA: Insufficient documentation

## 2014-12-09 NOTE — Therapy (Signed)
[facility] [address] [place], [place], [address] Phone: [phone]   Fax:  [phone]  Physical Therapy Evaluation  Patient Details  Name: Christine Douglas MRN: [id number] Date of Birth: [birth year] Referring Provider:  Renaye Rakers, MD  Encounter Date: 12/09/2014      PT End of Session - 12/09/14 1610    Visit Number 1   Number of Visits 8   Date for PT Re-Evaluation 01/06/15   Authorization Type Med Pay   PT Start Time 1445   PT Stop Time 1515   PT Time Calculation (min) 30 min   Activity Tolerance Patient tolerated treatment well   Behavior During Therapy [facility] for tasks assessed/performed      Past Medical History  Diagnosis Date  . Neuromuscular disorder     nerve damage C6  C7  . BV (bacterial vaginosis)     Past Surgical History  Procedure Laterality Date  . Cesarean section    . Abdominal hysterectomy      partial hysterectomy  . Shoulder arthroscopy with rotator cuff repair and subacromial decompression  06/04/2012    Procedure: SHOULDER ARTHROSCOPY WITH ROTATOR CUFF REPAIR AND SUBACROMIAL DECOMPRESSION;  Surgeon: Loreta Ave, MD;  Location: [facility];  Service: Orthopedics;  Laterality: Left;  LEFT ARTHROSCOPY SHOULDER DECOMPRESSION SUBACROMIAL PARTIAL ACROMIOPLASTY WITH CORACOACROMIAL RELEASE, DISTAL CLAVICULECTOMY,  ROTATOR CUFF REPAIR   . Back surgery      spine surgery; 2 screws and plate in neck    There were no vitals filed for this visit.  Visit Diagnosis:  Cervicalgia  Weakness of both arms  Poor posture  Stiffness of neck      Subjective Assessment - 12/09/14 1455    Subjective Pt reports that she has "knots" in both sides of her neck and in the L side of her low back. Pt reports that she had fusion of C5/6 about a year and a half ago. She reports that she feels pins and needles in her shoulders and sometimes in her arm, mostly on the L side. Pt has hx of RTC repair on L  shoulder. Pt reports that she has the most difficulty with washing and styling her hair, as well as with donning and doffing bra behind her back. She reports that she also has difficulty with bending and stooping.    Pertinent History C5/6 fusion in 2014   How long can you sit comfortably? no limitations   How long can you stand comfortably? 2 hours   How long can you walk comfortably? one mile   Patient Stated Goals decrease pain, get back to normal activities including playing tennis   Currently in Pain? Yes   Pain Score 6    Pain Location Neck   Pain Orientation Left;Right  L>R   Pain Descriptors / Indicators Sharp   Multiple Pain Sites No            [facility] PT Assessment - 12/09/14 0001    Assessment   Medical Diagnosis Neck pain   Prior Therapy prior to surgery   Balance Screen   Has the patient fallen in the past 6 months No   Has the patient had a decrease in activity level because of a fear of falling?  No   Is the patient reluctant to leave their home because of a fear of falling?  No   Home Environment   Living Environment Private residence   Living Arrangements Alone   Type  of Home House   Home Access Stairs to enter   Entrance Stairs-Number of Steps 3   Prior Function   Level of Independence Independent   Posture/Postural Control   Posture/Postural Control Postural limitations   Postural Limitations Rounded Shoulders;Forward head;Increased thoracic kyphosis   ROM / Strength   AROM / PROM / Strength AROM;Strength   AROM   Overall AROM Comments shoulder ROM WNL   AROM Assessment Site Cervical   Cervical Flexion 44   Cervical Extension 24   Cervical - Right Side Bend 23   Cervical - Left Side Bend 28   Cervical - Right Rotation 74   Cervical - Left Rotation 78   Strength   Strength Assessment Site Shoulder   Right/Left Shoulder Right;Left   Right Shoulder Flexion 4-/5   Right Shoulder Extension 4/5   Right Shoulder ABduction 4/5   Right Shoulder Internal  Rotation 4/5   Right Shoulder External Rotation 4/5   Left Shoulder Flexion 4-/5   Left Shoulder Extension 4/5   Left Shoulder ABduction 4/5   Left Shoulder Internal Rotation 4/5   Left Shoulder External Rotation 4/5   Palpation   Palpation comment Tenderness with palpation of bilateral upper trap, levator scap, cervical paraspinals                PT Education - 12/09/14 1610    Education provided Yes   Education Details Educated on proper posture   Person(s) Educated Patient   Methods Explanation   Comprehension Verbalized understanding          PT Short Term Goals - 12/09/14 1614    PT SHORT TERM GOAL #1   Title Pt will be independent with HEP.    Time 2   Period Weeks   Status New   PT SHORT TERM GOAL #2   Title Improve cervical flexion and extension by 10 degrees to improve ability to complete functional tasks.    Time 2   Period Weeks   Status New   PT SHORT TERM GOAL #3   Title Improve BUE strength by 1/2 grade to allow pt to complete self care.    Time 2   Period Weeks   Status New           PT Long Term Goals - 12/09/14 1615    PT LONG TERM GOAL #1   Title Pt will be independent with advanced HEP for postural strengthening and cervical stabilization.    Time 4   Period Weeks   Status New   PT LONG TERM GOAL #2   Title Improve cervical flexion and extension by 15 degrees to improve ability to complete functional tasks.    Time 4   Period Weeks   Status New   PT LONG TERM GOAL #3   Title Postural muscle (mid trap, low trap, rhomboids) strength to be 4+/5 to enable pt to maintain proper posture at work.    Time 4   Period Weeks   Status New   PT LONG TERM GOAL #4   Title Improve BUE strength to 5/5 to allow pt to complete self care without pain.    Time 4   Period Weeks   Status New               Plan - 12/09/14 1611    Clinical Impression Statement Pt presents to PT with impairments in cervical ROM, BUE strength, posture, and  functional activity tolerance. She will benefit from skilled PT  to provide manual therapy and postural strengthening in order to decrease pain in cervical spine, improve functional activity tolerance, and normalize posture to allow pt to return to work at [employer].    Pt will benefit from skilled therapeutic intervention in order to improve on the following deficits Decreased activity tolerance;Decreased range of motion;Decreased strength;Increased muscle spasms;Postural dysfunction   Rehab Potential Good   PT Frequency 2x / week   PT Duration 4 weeks   PT Treatment/Interventions ADLs/Self Care Home Management;Moist Heat;Therapeutic activities;Therapeutic exercise;Patient/family education;Manual techniques;Passive range of motion   PT Next Visit Plan Begin manual therapy, postural strengthening         Problem List Patient Active Problem List   Diagnosis Date Noted  . BV (bacterial vaginosis) 11/02/2012    Leona Singleton, PT, DPT [phone] 12/09/2014, 4:18 PM  [facility] [address] [place], [place], [address] Phone: [phone]   Fax:  [phone]

## 2014-12-14 ENCOUNTER — Ambulatory Visit (HOSPITAL_COMMUNITY): Payer: BLUE CROSS/BLUE SHIELD | Admitting: Physical Therapy

## 2014-12-14 DIAGNOSIS — R29898 Other symptoms and signs involving the musculoskeletal system: Secondary | ICD-10-CM

## 2014-12-14 DIAGNOSIS — M542 Cervicalgia: Secondary | ICD-10-CM | POA: Diagnosis not present

## 2014-12-14 DIAGNOSIS — R293 Abnormal posture: Secondary | ICD-10-CM

## 2014-12-14 NOTE — Patient Instructions (Addendum)
  Cervical Retractions  Tuck chin pressing head straight down into bed or mat table.     Prone Abduction  Lying on your stomach raise your arms up towards the ceiling, squeezing your shoulder blades together and maintain straight elbows.     Prone Shoulder Scaption  Holding thumb up, bring arm up at 45 degree angle.

## 2014-12-14 NOTE — Therapy (Signed)
[facility] [address] [place], [place], [address] Phone: [phone]   Fax:  [phone]  Physical Therapy Treatment  Patient Details  Name: Christine Douglas MRN: [id number] Date of Birth: 07/27/1960 Referring Provider:  Renaye Rakers, MD  Encounter Date: 12/14/2014      PT End of Session - 12/14/14 0936    Visit Number 2   Number of Visits 8   Date for PT Re-Evaluation 01/06/15   Authorization Type Med Pay   PT Start Time 0811   PT Stop Time 0845   PT Time Calculation (min) 34 min   Activity Tolerance Patient tolerated treatment well   Behavior During Therapy [place] for tasks assessed/performed      Past Medical History  Diagnosis Date  . Neuromuscular disorder     nerve damage C6  C7  . BV (bacterial vaginosis)     Past Surgical History  Procedure Laterality Date  . Cesarean section    . Abdominal hysterectomy      partial hysterectomy  . Shoulder arthroscopy with rotator cuff repair and subacromial decompression  06/04/2012    Procedure: SHOULDER ARTHROSCOPY WITH ROTATOR CUFF REPAIR AND SUBACROMIAL DECOMPRESSION;  Surgeon: Loreta Ave, MD;  Location: [facility];  Service: Orthopedics;  Laterality: Left;  LEFT ARTHROSCOPY SHOULDER DECOMPRESSION SUBACROMIAL PARTIAL ACROMIOPLASTY WITH CORACOACROMIAL RELEASE, DISTAL CLAVICULECTOMY,  ROTATOR CUFF REPAIR   . Back surgery      spine surgery; 2 screws and plate in neck    There were no vitals filed for this visit.  Visit Diagnosis:  Cervicalgia  Weakness of both arms  Poor posture      Subjective Assessment - 12/14/14 0815    Subjective Pt reports that she was doing squats the other day to try to build up her strength and she felt very sore for the next 2 days. She denies any pain in her neck today.    Currently in Pain? No/denies   Pain Score 0-No pain                 [facility] Adult PT Treatment/Exercise - 12/14/14 0001    Exercises   Exercises  Neck;Shoulder   Neck Exercises: Supine   Neck Retraction 10 reps;3 secs   Shoulder Exercises: Prone   Horizontal ABduction 1 10 reps   Horizontal ABduction 2 10 reps   Horizontal ABduction 2 Limitations scaption   Shoulder Exercises: Standing   Extension 15 reps;Theraband   Theraband Level (Shoulder Extension) Level 2 (Red)   Row 15 reps;Theraband   Theraband Level (Shoulder Row) Level 2 (Red)   Retraction 15 reps;Theraband   Theraband Level (Shoulder Retraction) Level 2 (Red)   Shoulder Exercises: ROM/Strengthening   UBE (Upper Arm Bike) 2' forward, 2' backward level 1   Manual Therapy   Manual Therapy Soft tissue mobilization   Soft tissue mobilization Bilateral levator scap, upper/mid trap                PT Education - 12/14/14 0935    Education provided Yes   Education Details HEP   Person(s) Educated Patient   Methods Explanation;Handout   Comprehension Verbalized understanding;Returned demonstration          PT Short Term Goals - 12/09/14 1614    PT SHORT TERM GOAL #1   Title Pt will be independent with HEP.    Time 2   Period Weeks   Status New   PT SHORT TERM GOAL #2  Title Improve cervical flexion and extension by 10 degrees to improve ability to complete functional tasks.    Time 2   Period Weeks   Status New   PT SHORT TERM GOAL #3   Title Improve BUE strength by 1/2 grade to allow pt to complete self care.    Time 2   Period Weeks   Status New           PT Long Term Goals - 12/09/14 1615    PT LONG TERM GOAL #1   Title Pt will be independent with advanced HEP for postural strengthening and cervical stabilization.    Time 4   Period Weeks   Status New   PT LONG TERM GOAL #2   Title Improve cervical flexion and extension by 15 degrees to improve ability to complete functional tasks.    Time 4   Period Weeks   Status New   PT LONG TERM GOAL #3   Title Postural muscle (mid trap, low trap, rhomboids) strength to be 4+/5 to enable pt  to maintain proper posture at work.    Time 4   Period Weeks   Status New   PT LONG TERM GOAL #4   Title Improve BUE strength to 5/5 to allow pt to complete self care without pain.    Time 4   Period Weeks   Status New               Plan - 12/14/14 0936    Clinical Impression Statement Pt 11 minutes late to treatment session. Treatment focused on manual therapy to upper and mid trap to decrease muscle tightness, followed by cervical strengthening and postural exercises to decrease neck pain and improve ability to maintain upright posture.    PT Next Visit Plan Continue with postural strengthening.         Problem List Patient Active Problem List   Diagnosis Date Noted  . BV (bacterial vaginosis) 11/02/2012    Leona Singleton, PT, DPT [phone] 12/14/2014, 9:38 AM  [facility] [address] [place], [place], [address] Phone: [phone]   Fax:  [phone]

## 2014-12-16 ENCOUNTER — Ambulatory Visit (HOSPITAL_COMMUNITY): Payer: BLUE CROSS/BLUE SHIELD

## 2014-12-16 ENCOUNTER — Telehealth (HOSPITAL_COMMUNITY): Payer: Self-pay

## 2014-12-16 DIAGNOSIS — M542 Cervicalgia: Secondary | ICD-10-CM

## 2014-12-16 DIAGNOSIS — R293 Abnormal posture: Secondary | ICD-10-CM

## 2014-12-16 DIAGNOSIS — R29898 Other symptoms and signs involving the musculoskeletal system: Secondary | ICD-10-CM

## 2014-12-16 DIAGNOSIS — M436 Torticollis: Secondary | ICD-10-CM

## 2014-12-16 NOTE — Telephone Encounter (Signed)
No show, called and left message informing missed apt., included next apt date and time as well as contact information.  [address], LPT[place]; CBIS [phone]

## 2014-12-16 NOTE — Therapy (Signed)
[facility] [address] [place], [place], [address] Phone: [phone]   Fax:  [phone]  Physical Therapy Treatment  Patient Details  Name: Christine Douglas MRN: [id number] Date of Birth: 1961/03/26 Referring Provider:  Renaye Rakers, MD  Encounter Date: 12/16/2014      PT End of Session - 12/16/14 1524    Visit Number 3   Number of Visits 8   Date for PT Re-Evaluation 01/06/15   Authorization Type Med Pay   PT Start Time 1435   PT Stop Time 1515   PT Time Calculation (min) 40 min   Activity Tolerance Patient tolerated treatment well   Behavior During Therapy [facility] for tasks assessed/performed      Past Medical History  Diagnosis Date  . Neuromuscular disorder     nerve damage C6  C7  . BV (bacterial vaginosis)     Past Surgical History  Procedure Laterality Date  . Cesarean section    . Abdominal hysterectomy      partial hysterectomy  . Shoulder arthroscopy with rotator cuff repair and subacromial decompression  06/04/2012    Procedure: SHOULDER ARTHROSCOPY WITH ROTATOR CUFF REPAIR AND SUBACROMIAL DECOMPRESSION;  Surgeon: Loreta Ave, MD;  Location:  SURGERY CENTER;  Service: Orthopedics;  Laterality: Left;  LEFT ARTHROSCOPY SHOULDER DECOMPRESSION SUBACROMIAL PARTIAL ACROMIOPLASTY WITH CORACOACROMIAL RELEASE, DISTAL CLAVICULECTOMY,  ROTATOR CUFF REPAIR   . Back surgery      spine surgery; 2 screws and plate in neck    There were no vitals filed for this visit.  Visit Diagnosis:  Cervicalgia  Weakness of both arms  Poor posture  Stiffness of neck      Subjective Assessment - 12/16/14 1437    Subjective Pain scale 6/10 achey Lt side of neck and middle of back with pins and needles Lt arm to ring and pinkie fingers    Currently in Pain? Yes   Pain Score 6    Pain Location Neck   Pain Orientation Left;Posterior   Pain Descriptors / Indicators Aching;Pins and needles               [facility] Adult  PT Treatment/Exercise - 12/16/14 0001    Exercises   Exercises Neck;Shoulder   Neck Exercises: Machines for Strengthening   UBE (Upper Arm Bike) 2' forward/ 2' backwards   Neck Exercises: Theraband   Scapula Retraction 10 reps;Green   Shoulder Extension 10 reps;Green   Rows 10 reps;Green   Neck Exercises: Supine   Neck Retraction 10 reps;3 secs   Neck Exercises: Prone   Rows 10 reps   Other Prone Exercise Prone T and Y  horizontal abduction 10 reps    Shoulder Exercises: Prone   Horizontal ABduction 1 10 reps   Horizontal ABduction 2 10 reps   Manual Therapy   Manual Therapy Soft tissue mobilization   Soft tissue mobilization Bilateral levator scap, upper/mid trap supine with LE elevated                  PT Short Term Goals - 12/16/14 1801    PT SHORT TERM GOAL #1   Title Pt will be independent with HEP.    Status On-going   PT SHORT TERM GOAL #2   Title Improve cervical flexion and extension by 10 degrees to improve ability to complete functional tasks.    Status On-going   PT SHORT TERM GOAL #3   Title Improve BUE strength by 1/2 grade to allow  pt to complete self care.    Status On-going           PT Long Term Goals - 12/09/14 1615    PT LONG TERM GOAL #1   Title Pt will be independent with advanced HEP for postural strengthening and cervical stabilization.    Time 4   Period Weeks   Status New   PT LONG TERM GOAL #2   Title Improve cervical flexion and extension by 15 degrees to improve ability to complete functional tasks.    Time 4   Period Weeks   Status New   PT LONG TERM GOAL #3   Title Postural muscle (mid trap, low trap, rhomboids) strength to be 4+/5 to enable pt to maintain proper posture at work.    Time 4   Period Weeks   Status New   PT LONG TERM GOAL #4   Title Improve BUE strength to 5/5 to allow pt to complete self care without pain.    Time 4   Period Weeks   Status New               Plan - 12/16/14 1758    Clinical  Impression Statement Began session with manual to reduce spasms and overall tension Bil L>Rt upper traps and Lt levator scapula.  Pt reported relief with positional release technqiues to upper traps with noted improved cervical ROM.  Unable to fuly resolve spasms Lt levator scapula and limited by neck pain and headache.  Continued postural strenghteing exercises with min cueing for form and technique and to improve cervical retraction with activities.  Pt encouraged to stay hydrated to reduce risk of headaches following manual.     PT Next Visit Plan Continue with postural strengthening.         Problem List Patient Active Problem List   Diagnosis Date Noted  . BV (bacterial vaginosis) 11/02/2012   Becky Sax, LPTA; CBIS [phone]  Juel Burrow 12/16/2014, 6:02 PM  [facility] [address] [place], [place], [address] Phone: [phone]   Fax:  [phone]

## 2014-12-20 ENCOUNTER — Ambulatory Visit (HOSPITAL_COMMUNITY): Payer: BLUE CROSS/BLUE SHIELD | Admitting: Physical Therapy

## 2014-12-22 ENCOUNTER — Telehealth (HOSPITAL_COMMUNITY): Payer: Self-pay | Admitting: Physical Therapy

## 2014-12-22 ENCOUNTER — Ambulatory Visit (HOSPITAL_COMMUNITY): Payer: BLUE CROSS/BLUE SHIELD | Admitting: Physical Therapy

## 2014-12-22 NOTE — Telephone Encounter (Signed)
Pt called regarding no show for 8:00 appt. Reported that she thought her appt was at 2:00 today. Reminded of next appt time.

## 2014-12-26 ENCOUNTER — Ambulatory Visit (HOSPITAL_COMMUNITY): Payer: BLUE CROSS/BLUE SHIELD | Admitting: Physical Therapy

## 2014-12-26 DIAGNOSIS — M542 Cervicalgia: Secondary | ICD-10-CM

## 2014-12-26 DIAGNOSIS — R293 Abnormal posture: Secondary | ICD-10-CM

## 2014-12-26 DIAGNOSIS — M436 Torticollis: Secondary | ICD-10-CM

## 2014-12-26 DIAGNOSIS — R29898 Other symptoms and signs involving the musculoskeletal system: Secondary | ICD-10-CM

## 2014-12-26 NOTE — Therapy (Signed)
[facility] [address] [place], [place], [address] Phone: [phone]   Fax:  [phone]  Physical Therapy Treatment  Patient Details  Name: Christine Douglas MRN: [id number] Date of Birth: 15-Sep-1960 Referring Provider:  Renaye Rakers, MD  Encounter Date: 12/26/2014      PT End of Session - 12/26/14 1619    Visit Number 4   Number of Visits 8   Date for PT Re-Evaluation 01/06/15   Authorization Type Med Pay   PT Start Time 1430   PT Stop Time 1510   PT Time Calculation (min) 40 min   Activity Tolerance Patient tolerated treatment well   Behavior During Therapy [facility] for tasks assessed/performed      Past Medical History  Diagnosis Date  . Neuromuscular disorder     nerve damage C6  C7  . BV (bacterial vaginosis)     Past Surgical History  Procedure Laterality Date  . Cesarean section    . Abdominal hysterectomy      partial hysterectomy  . Shoulder arthroscopy with rotator cuff repair and subacromial decompression  06/04/2012    Procedure: SHOULDER ARTHROSCOPY WITH ROTATOR CUFF REPAIR AND SUBACROMIAL DECOMPRESSION;  Surgeon: Loreta Ave, MD;  Location: [facility];  Service: Orthopedics;  Laterality: Left;  LEFT ARTHROSCOPY SHOULDER DECOMPRESSION SUBACROMIAL PARTIAL ACROMIOPLASTY WITH CORACOACROMIAL RELEASE, DISTAL CLAVICULECTOMY,  ROTATOR CUFF REPAIR   . Back surgery      spine surgery; 2 screws and plate in neck    There were no vitals filed for this visit.  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 She reports that he pain has decreased today.    Currently in Pain? Yes   Pain Score 6    Pain Location Neck            [facility] Adult PT Treatment/Exercise - 12/26/14 0001     Neck Exercises: Machines for Strengthening   UBE (Upper Arm Bike) 2' forward/ 2' backwards   Neck Exercises: Theraband   Scapula Retraction 15 reps;Green   Shoulder Extension 15 reps;Green   Rows 15 reps;Green   Neck Exercises: Standing   Other Standing Exercises wall push ups x 10   Other Standing Exercises wall slides with lift off x10   Neck Exercises: Supine   Neck Retraction 15 reps;3 secs   Neck Exercises: Prone   Rows 10 reps   Other Prone Exercise Prone T and Y  horizontal abduction 10 reps    Manual Therapy   Manual Therapy Soft tissue mobilization   Soft tissue mobilization Bilateral levator scap, upper/mid trap supine with LE elevated                  PT Short Term Goals - 12/16/14 1801    PT SHORT TERM GOAL #1   Title Pt will be independent with HEP.    Status On-going   PT SHORT TERM GOAL #2   Title Improve cervical flexion and extension by 10 degrees to improve ability to complete functional tasks.    Status On-going   PT SHORT TERM GOAL #3   Title Improve BUE strength by 1/2 grade to allow pt to complete self care.  Status On-going           PT Long Term Goals - 12/09/14 1615    PT LONG TERM GOAL #1   Title Pt will be independent with advanced HEP for postural strengthening and cervical stabilization.    Time 4   Period Weeks   Status New   PT LONG TERM GOAL #2   Title Improve cervical flexion and extension by 15 degrees to improve ability to complete functional tasks.    Time 4   Period Weeks   Status New   PT LONG TERM GOAL #3   Title Postural muscle (mid trap, low trap, rhomboids) strength to be 4+/5 to enable pt to maintain proper posture at work.    Time 4   Period Weeks   Status New   PT LONG TERM GOAL #4   Title Improve BUE strength to 5/5 to allow pt to complete self care without pain.    Time 4   Period Weeks   Status New               Plan - 12/26/14 1620    Clinical Impression Statement Began treatment  session with manual therapy to decrease headaches and muscle pain in levator scap and upper trap bilaterally. Pt demonstrated improved soft tissue mobility today, but c/o pain mostly in levator scap muscle. Postural strengthening was progressed, and pt was given updated HEP with new therex. Denied any increased pain post treatment.    PT Next Visit Plan Continue to progress postural strengthening        Problem List Patient Active Problem List   Diagnosis Date Noted  . BV (bacterial vaginosis) 11/02/2012    Leona Singleton, PT, DPT [phone] 12/26/2014, 4:23 PM  [facility] [address] [place], [place], [address] Phone: [phone]   Fax:  [phone]

## 2014-12-28 ENCOUNTER — Ambulatory Visit (HOSPITAL_COMMUNITY): Payer: BLUE CROSS/BLUE SHIELD

## 2014-12-28 DIAGNOSIS — M542 Cervicalgia: Secondary | ICD-10-CM

## 2014-12-28 DIAGNOSIS — R29898 Other symptoms and signs involving the musculoskeletal system: Secondary | ICD-10-CM

## 2014-12-28 DIAGNOSIS — R293 Abnormal posture: Secondary | ICD-10-CM

## 2014-12-28 DIAGNOSIS — M436 Torticollis: Secondary | ICD-10-CM

## 2014-12-28 NOTE — Therapy (Signed)
[facility] [address] [place], [place], [address] Phone: [phone]   Fax:  [phone]  Physical Therapy Treatment  Patient Details  Name: NARMEEN KERPER MRN: [id number] Date of Birth: [birth year] Referring Provider:  Renaye Rakers, MD  Encounter Date: 12/28/2014      PT End of Session - 12/28/14 1754    Visit Number 5   Number of Visits 8   Date for PT Re-Evaluation 01/06/15   Authorization Type Med Pay   PT Start Time 1538   PT Stop Time 1603   PT Time Calculation (min) 25 min   Activity Tolerance Patient tolerated treatment well   Behavior During Therapy [facility] for tasks assessed/performed      Past Medical History  Diagnosis Date  . Neuromuscular disorder     nerve damage C6  C7  . BV (bacterial vaginosis)     Past Surgical History  Procedure Laterality Date  . Cesarean section    . Abdominal hysterectomy      partial hysterectomy  . Shoulder arthroscopy with rotator cuff repair and subacromial decompression  06/04/2012    Procedure: SHOULDER ARTHROSCOPY WITH ROTATOR CUFF REPAIR AND SUBACROMIAL DECOMPRESSION;  Surgeon: Loreta Ave, MD;  Location:  SURGERY CENTER;  Service: Orthopedics;  Laterality: Left;  LEFT ARTHROSCOPY SHOULDER DECOMPRESSION SUBACROMIAL PARTIAL ACROMIOPLASTY WITH CORACOACROMIAL RELEASE, DISTAL CLAVICULECTOMY,  ROTATOR CUFF REPAIR   . Back surgery      spine surgery; 2 screws and plate in neck    There were no vitals filed for this visit.  Visit Diagnosis:  Cervicalgia  Weakness of both arms  Poor posture  Stiffness of neck      Subjective Assessment - 12/28/14 1739    Subjective Pt stated Lt neck feels like a pull.  Reports increased awareness with posture.  Stated headaches posterior head following PT sessions, pt stated she is concerned with her blood pressure   Currently in Pain? Yes   Pain Score 4    Pain Location Neck   Pain Orientation Left;Posterior            [facility] Adult PT Treatment/Exercise - 12/28/14 0001    Exercises   Exercises Neck;Shoulder   Neck Exercises: Standing   Other Standing Exercises wall push ups x 15   Other Standing Exercises wall slides with lift off x10   Neck Exercises: Prone   Rows 15 reps   Other Prone Exercise Prone T and Y  horizontal abduction 15 reps    Manual Therapy   Manual Therapy Soft tissue mobilization   Soft tissue mobilization Bilateral levator scap, upper/mid trap supine with LE elevated             PT Short Term Goals - 12/16/14 1801    PT SHORT TERM GOAL #1   Title Pt will be independent with HEP.    Status On-going   PT SHORT TERM GOAL #2   Title Improve cervical flexion and extension by 10 degrees to improve ability to complete functional tasks.    Status On-going   PT SHORT TERM GOAL #3   Title Improve BUE strength by 1/2 grade to allow pt to complete self care.    Status On-going           PT Long Term Goals - 12/09/14 1615    PT LONG TERM GOAL #1   Title Pt will be independent with advanced HEP for postural strengthening and cervical stabilization.    Time  4   Period Weeks   Status New   PT LONG TERM GOAL #2   Title Improve cervical flexion and extension by 15 degrees to improve ability to complete functional tasks.    Time 4   Period Weeks   Status New   PT LONG TERM GOAL #3   Title Postural muscle (mid trap, low trap, rhomboids) strength to be 4+/5 to enable pt to maintain proper posture at work.    Time 4   Period Weeks   Status New   PT LONG TERM GOAL #4   Title Improve BUE strength to 5/5 to allow pt to complete self care without pain.    Time 4   Period Weeks   Status New               Plan - 12/28/14 1756    Clinical Impression Statement Pt late for apt., unable to complete full POC.  Session focus on postural strengthening and manual techniques for pain control.  Pt c/o Rt rotator cuff pain during wall slides, exercises revised and able to complete  with no reports of increased pain.  Pt required min cueing for proper form with exercises.  Manual technqiues complete at end of session to reduce Bil Upper traps spasms and pain with noted tightness reduced following though not fully resolved.  Pt reported increased headaches following OPPT session and concerned with BP.  BP taken at 119/69 mmHg and HR 96bmp in middle of session.  No reports of increased neck pain through session.  Pt encouraged to stay hydrated to reduce risk of headaches following session.     PT Next Visit Plan Continue to progress postural strengthening        Problem List Patient Active Problem List   Diagnosis Date Noted  . BV (bacterial vaginosis) 11/02/2012   Becky Sax, LPTA; CBIS [phone]  Juel Burrow 12/28/2014, 7:19 PM  [facility] [address] [place], [place], [address] Phone: [phone]   Fax:  [phone]

## 2014-12-30 ENCOUNTER — Telehealth (HOSPITAL_COMMUNITY): Payer: Self-pay

## 2015-01-02 ENCOUNTER — Ambulatory Visit (HOSPITAL_COMMUNITY): Payer: BLUE CROSS/BLUE SHIELD | Admitting: Physical Therapy

## 2015-01-02 DIAGNOSIS — R293 Abnormal posture: Secondary | ICD-10-CM

## 2015-01-02 DIAGNOSIS — M542 Cervicalgia: Secondary | ICD-10-CM

## 2015-01-02 DIAGNOSIS — R29898 Other symptoms and signs involving the musculoskeletal system: Secondary | ICD-10-CM

## 2015-01-02 NOTE — Therapy (Signed)
[facility] [address] [place], [place], [address] Phone: [phone]   Fax:  [phone]  Physical Therapy Treatment  Patient Details  Name: Christine Douglas MRN: [id number] Date of Birth: [birth year] Referring Provider:  Renaye Rakers, MD  Encounter Date: 01/02/2015      PT End of Session - 01/02/15 1612    Visit Number 6   Number of Visits 8   Date for PT Re-Evaluation 01/06/15   Authorization Type Med Pay   PT Start Time 1444   PT Stop Time 1518   PT Time Calculation (min) 34 min   Activity Tolerance Patient tolerated treatment well   Behavior During Therapy [facility] for tasks assessed/performed      Past Medical History  Diagnosis Date  . Neuromuscular disorder     nerve damage C6  C7  . BV (bacterial vaginosis)     Past Surgical History  Procedure Laterality Date  . Cesarean section    . Abdominal hysterectomy      partial hysterectomy  . Shoulder arthroscopy with rotator cuff repair and subacromial decompression  06/04/2012    Procedure: SHOULDER ARTHROSCOPY WITH ROTATOR CUFF REPAIR AND SUBACROMIAL DECOMPRESSION;  Surgeon: Loreta Ave, MD;  Location: [facility];  Service: Orthopedics;  Laterality: Left;  LEFT ARTHROSCOPY SHOULDER DECOMPRESSION SUBACROMIAL PARTIAL ACROMIOPLASTY WITH CORACOACROMIAL RELEASE, DISTAL CLAVICULECTOMY,  ROTATOR CUFF REPAIR   . Back surgery      spine surgery; 2 screws and plate in neck    There were no vitals filed for this visit.  Visit Diagnosis:  Cervicalgia  Weakness of both arms  Poor posture      Subjective Assessment - 01/02/15 1611    Subjective Pt reports that she has increased neck pain today. She states that she went swimming over the weekend, and ever since then she has had increased tightness and pain in neck and shoulders. Pt rates pain as 8/10 today.    Currently in Pain? Yes   Pain Score 8    Pain Location Neck                          [facility] Adult PT Treatment/Exercise - 01/02/15 0001    Neck Exercises: Seated   Cervical Rotation 15 reps   Shoulder Rolls Backwards;15 reps   Other Seated Exercise seated retraction x 15   Shoulder Exercises: ROM/Strengthening   UBE (Upper Arm Bike) 2' forward, 2' backward level 1   Manual Therapy   Manual Therapy Soft tissue mobilization   Soft tissue mobilization Bilateral levator scap, upper/mid trap supine with LE elevated                  PT Short Term Goals - 12/16/14 1801    PT SHORT TERM GOAL #1   Title Pt will be independent with HEP.    Status On-going   PT SHORT TERM GOAL #2   Title Improve cervical flexion and extension by 10 degrees to improve ability to complete functional tasks.    Status On-going   PT SHORT TERM GOAL #3   Title Improve BUE strength by 1/2 grade to allow pt to complete self care.    Status On-going           PT Long Term Goals - 12/09/14 1615    PT LONG TERM GOAL #1   Title Pt will be independent with advanced HEP for postural strengthening and cervical stabilization.  Time 4   Period Weeks   Status New   PT LONG TERM GOAL #2   Title Improve cervical flexion and extension by 15 degrees to improve ability to complete functional tasks.    Time 4   Period Weeks   Status New   PT LONG TERM GOAL #3   Title Postural muscle (mid trap, low trap, rhomboids) strength to be 4+/5 to enable pt to maintain proper posture at work.    Time 4   Period Weeks   Status New   PT LONG TERM GOAL #4   Title Improve BUE strength to 5/5 to allow pt to complete self care without pain.    Time 4   Period Weeks   Status New               Plan - 01/02/15 1613    Clinical Impression Statement Treatment session began with manual therapy to decrease pain levels and improve ability to complete gentle therex. Pt demonstrated increased tightness and mm spasm in bilateral upper traps, levator scap, and rhomboids today that decreased with manual  therapy. Pt was limited in therex today due to increased pain when performing exercises, especially with prone T's. Therex focused on gentle seated strengthening for postural muscles to prevent increasing pain levels.    PT Next Visit Plan Continue with manual PRN and postural strengthening        Problem List Patient Active Problem List   Diagnosis Date Noted  . BV (bacterial vaginosis) 11/02/2012    Leona Singleton, PT, DPT [phone] 01/02/2015, 4:19 PM  [facility] [address] [place], [place], [address] Phone: [phone]   Fax:  [phone]

## 2015-01-04 ENCOUNTER — Ambulatory Visit (HOSPITAL_COMMUNITY): Payer: BLUE CROSS/BLUE SHIELD | Admitting: Physical Therapy

## 2015-01-10 ENCOUNTER — Ambulatory Visit (HOSPITAL_COMMUNITY): Payer: BLUE CROSS/BLUE SHIELD | Attending: Family Medicine | Admitting: Physical Therapy

## 2015-01-10 DIAGNOSIS — M542 Cervicalgia: Secondary | ICD-10-CM | POA: Insufficient documentation

## 2015-01-10 DIAGNOSIS — R293 Abnormal posture: Secondary | ICD-10-CM | POA: Insufficient documentation

## 2015-01-10 DIAGNOSIS — M436 Torticollis: Secondary | ICD-10-CM | POA: Insufficient documentation

## 2015-01-10 DIAGNOSIS — R29898 Other symptoms and signs involving the musculoskeletal system: Secondary | ICD-10-CM | POA: Insufficient documentation

## 2015-01-12 ENCOUNTER — Ambulatory Visit (HOSPITAL_COMMUNITY): Payer: BLUE CROSS/BLUE SHIELD | Admitting: Physical Therapy

## 2015-01-12 DIAGNOSIS — M542 Cervicalgia: Secondary | ICD-10-CM | POA: Diagnosis present

## 2015-01-12 DIAGNOSIS — R293 Abnormal posture: Secondary | ICD-10-CM | POA: Diagnosis present

## 2015-01-12 DIAGNOSIS — M436 Torticollis: Secondary | ICD-10-CM

## 2015-01-12 DIAGNOSIS — R29898 Other symptoms and signs involving the musculoskeletal system: Secondary | ICD-10-CM | POA: Diagnosis present

## 2015-01-12 NOTE — Therapy (Addendum)
[place], [place], [address] Phone: [phone]   Fax:  [phone]  Physical Therapy Treatment  Patient Details  Name: Christine Douglas MRN: [id number] Date of Birth: 26-Feb-1961 Referring Provider:  Lucianne Lei, MD  Encounter Date: 01/12/2015      PT End of Session - 01/12/15 1535    Visit Number 7   Number of Visits 8   Date for PT Re-Evaluation 02/09/15   Authorization Type [employer]   Authorization - Visit Number 7   Authorization - Number of Visits 8   PT Start Time 5830   PT Stop Time 1345   PT Time Calculation (min) 39 min   Activity Tolerance Patient tolerated treatment well   Behavior During Therapy [facility] for tasks assessed/performed      Past Medical History  Diagnosis Date  . Neuromuscular disorder     nerve damage C6  C7  . BV (bacterial vaginosis)     Past Surgical History  Procedure Laterality Date  . Cesarean section    . Abdominal hysterectomy      partial hysterectomy  . Shoulder arthroscopy with rotator cuff repair and subacromial decompression  06/04/2012    Procedure: SHOULDER ARTHROSCOPY WITH ROTATOR CUFF REPAIR AND SUBACROMIAL DECOMPRESSION;  Surgeon: Ninetta Lights, MD;  Location: [place];  Service: Orthopedics;  Laterality: Left;  LEFT ARTHROSCOPY SHOULDER DECOMPRESSION SUBACROMIAL PARTIAL ACROMIOPLASTY WITH CORACOACROMIAL RELEASE, DISTAL CLAVICULECTOMY,  ROTATOR CUFF REPAIR   . Back surgery      spine surgery; 2 screws and plate in neck    There were no vitals filed for this visit.  Visit Diagnosis:  Cervicalgia  Weakness of both arms  Poor posture  Stiffness of neck      Subjective Assessment - 01/12/15 1311    Subjective Pt reports that she has increased pain in her neck today. She was traveling over the weekned and got back Monday morning, but had to sit in the airport for 12 hours so she has some increased soreness today. She reports that for the past  2 days, she has had to rest a lot. Pt has noticed improvements in posture, pain levels, and ROM.    How long can you sit comfortably? no limitations   How long can you stand comfortably? no limitations   How long can you walk comfortably? one mile   Currently in Pain? Yes   Pain Score 5    Pain Location Neck            [facility] PT Assessment - 01/12/15 0001    Observation/Other Assessments   Focus on Therapeutic Outcomes (FOTO)  39% limited   AROM   Cervical Flexion 65   Cervical Extension 35   Cervical - Right Side Bend 27   Cervical - Left Side Bend 35   Cervical - Right Rotation 71   Cervical - Left Rotation 72   Strength   Right Shoulder Flexion 4/5   Right Shoulder Extension 4+/5   Right Shoulder ABduction 4/5   Right Shoulder Internal Rotation 4+/5   Right Shoulder External Rotation 4+/5   Left Shoulder Flexion 4/5   Left Shoulder Extension 4+/5   Left Shoulder ABduction 4/5   Left Shoulder Internal Rotation 4+/5   Left Shoulder External Rotation 4+/5                     [facility] Adult PT Treatment/Exercise - 01/12/15 0001  Neck Exercises: Seated   Cervical Rotation 15 reps   Shoulder Rolls Backwards;15 reps   Other Seated Exercise seated retraction x 15   Manual Therapy   Manual Therapy Soft tissue mobilization   Soft tissue mobilization Bilateral levator scap, upper/mid trap supine with LE elevated                PT Education - 01/12/15 1535    Education provided Yes   Education Details Goals and progress reviewed   Person(s) Educated Patient   Methods Explanation   Comprehension Verbalized understanding          PT Short Term Goals - 01/12/15 1625    PT SHORT TERM GOAL #1   Title Pt will be independent with HEP.    PT SHORT TERM GOAL #2   Title Improve cervical flexion and extension by 10 degrees to improve ability to complete functional tasks.    Time 2   Period Weeks   Status Achieved   PT SHORT TERM GOAL #3   Title  Improve BUE strength by 1/2 grade to allow pt to complete self care.    Time 2   Period Weeks   Status Achieved           PT Long Term Goals - 01/12/15 1625    PT LONG TERM GOAL #1   Title Pt will be independent with advanced HEP for postural strengthening and cervical stabilization.    Time 4   Period Weeks   Status On-going   PT LONG TERM GOAL #2   Title Improve cervical flexion and extension by 15 degrees to improve ability to complete functional tasks.    Time 4   Period Weeks   Status On-going   PT LONG TERM GOAL #3   Title Postural muscle (mid trap, low trap, rhomboids) strength to be 4+/5 to enable pt to maintain proper posture at work.    Time 4   Period Weeks   Status On-going   PT LONG TERM GOAL #4   Title Improve BUE strength to 5/5 to allow pt to complete self care without pain.    Time 4   Period Weeks   Status On-going               Plan - 01/12/15 1619    Clinical Impression Statement Reassessment completed today. Pt has demonstrated improvements in cervical ROM, shoulder strength, and pain levels. She continues to demonstrate weakness in postural muscles, with mid trap, low trap, and rhomboid strength being 3+/5. Pt reports that she has been compliant with HEP, however, she has been inconsistent with her attendance to appointments, which has limited ability to make progress. Pt will benefit from 2 more weeks of PT to continue to address postural strengthening in order to decrease pain levels and improve ability to maintain proper posture to allow pt to return to work without pain. Pt's progress in PT will ultimately be affected by her compliance to treatment sessions and HEP, and pt has been educated on the importance of these aspects of her POC.    Pt will benefit from skilled therapeutic intervention in order to improve on the following deficits Decreased activity tolerance;Decreased range of motion;Decreased strength;Increased muscle spasms;Postural  dysfunction   Rehab Potential Good   PT Frequency 2x / week   PT Duration 2 weeks   PT Treatment/Interventions ADLs/Self Care Home Management;Moist Heat;Therapeutic activities;Therapeutic exercise;Patient/family education;Manual techniques;Passive range of motion   PT Next Visit Plan Continue with postural  strengthening        Problem List Patient Active Problem List   Diagnosis Date Noted  . BV (bacterial vaginosis) 11/02/2012     PHYSICAL THERAPY DISCHARGE SUMMARY  Visits from Start of Care: 7  Current functional level related to goals / functional outcomes: Pt was seen for 7 visits in PT.  She did not return following her visit on 01/12/15. See previous reassessment for most recent functional level.    Remaining deficits: Unable to assess   Education / Equipment: N/A  Plan: Patient agrees to discharge.  Patient goals were not met. Patient is being discharged due to not returning since the last visit.  ?????        Hilma Favors, PT, DPT [phone] 01/12/2015, 4:26 PM  [place] [address] [place], [place], [address] Phone: [phone]   Fax:  [phone]

## 2015-01-17 ENCOUNTER — Encounter (HOSPITAL_COMMUNITY): Payer: BLUE CROSS/BLUE SHIELD

## 2015-01-17 ENCOUNTER — Ambulatory Visit (HOSPITAL_COMMUNITY): Payer: BLUE CROSS/BLUE SHIELD | Admitting: Physical Therapy

## 2015-01-19 ENCOUNTER — Encounter (HOSPITAL_COMMUNITY): Payer: BLUE CROSS/BLUE SHIELD

## 2015-01-19 ENCOUNTER — Encounter (HOSPITAL_COMMUNITY): Payer: BLUE CROSS/BLUE SHIELD | Admitting: Physical Therapy

## 2015-01-24 ENCOUNTER — Encounter (HOSPITAL_COMMUNITY): Payer: BLUE CROSS/BLUE SHIELD | Admitting: Physical Therapy

## 2015-01-26 ENCOUNTER — Encounter (HOSPITAL_COMMUNITY): Payer: BLUE CROSS/BLUE SHIELD | Admitting: Physical Therapy

## 2015-01-31 ENCOUNTER — Encounter (HOSPITAL_COMMUNITY): Payer: BLUE CROSS/BLUE SHIELD | Admitting: Physical Therapy

## 2015-01-31 ENCOUNTER — Encounter (HOSPITAL_COMMUNITY): Payer: BLUE CROSS/BLUE SHIELD

## 2015-02-02 ENCOUNTER — Encounter (HOSPITAL_COMMUNITY): Payer: BLUE CROSS/BLUE SHIELD

## 2015-02-02 ENCOUNTER — Encounter (HOSPITAL_COMMUNITY): Payer: BLUE CROSS/BLUE SHIELD | Admitting: Physical Therapy

## 2015-03-15 NOTE — Telephone Encounter (Signed)
Called concerning apt date and time  Casey Cockerham, LPTA; CBIS [phone]  

## 2015-08-24 DIAGNOSIS — F4312 Post-traumatic stress disorder, chronic: Secondary | ICD-10-CM | POA: Diagnosis not present

## 2015-08-24 DIAGNOSIS — F410 Panic disorder [episodic paroxysmal anxiety] without agoraphobia: Secondary | ICD-10-CM | POA: Diagnosis not present

## 2015-08-24 DIAGNOSIS — F900 Attention-deficit hyperactivity disorder, predominantly inattentive type: Secondary | ICD-10-CM | POA: Diagnosis not present

## 2015-08-25 ENCOUNTER — Other Ambulatory Visit: Payer: Self-pay | Admitting: Family Medicine

## 2015-08-25 DIAGNOSIS — R5381 Other malaise: Secondary | ICD-10-CM

## 2015-09-20 ENCOUNTER — Other Ambulatory Visit: Payer: Self-pay | Admitting: Family Medicine

## 2015-09-20 DIAGNOSIS — Z1231 Encounter for screening mammogram for malignant neoplasm of breast: Secondary | ICD-10-CM

## 2015-10-03 ENCOUNTER — Encounter: Payer: Self-pay | Admitting: Women's Health

## 2015-10-03 ENCOUNTER — Ambulatory Visit (INDEPENDENT_AMBULATORY_CARE_PROVIDER_SITE_OTHER): Payer: BLUE CROSS/BLUE SHIELD | Admitting: Women's Health

## 2015-10-03 VITALS — BP 108/64 | HR 80 | Wt 104.0 lb

## 2015-10-03 DIAGNOSIS — R350 Frequency of micturition: Secondary | ICD-10-CM | POA: Diagnosis not present

## 2015-10-03 DIAGNOSIS — Z90711 Acquired absence of uterus with remaining cervical stump: Secondary | ICD-10-CM | POA: Diagnosis not present

## 2015-10-03 DIAGNOSIS — N898 Other specified noninflammatory disorders of vagina: Secondary | ICD-10-CM | POA: Diagnosis not present

## 2015-10-03 LAB — POCT WET PREP (WET MOUNT): Clue Cells Wet Prep Whiff POC: POSITIVE

## 2015-10-03 MED ORDER — METRONIDAZOLE 0.75 % VA GEL: 1.0000 | Freq: Every day | VAGINAL | Status: DC

## 2015-10-03 MED ORDER — SULFAMETHOXAZOLE-TRIMETHOPRIM 800-160 MG PO TABS: 1.0000 | ORAL_TABLET | Freq: Two times a day (BID) | ORAL | Status: DC

## 2015-10-03 NOTE — Progress Notes (Signed)
Patient ID: Christine Douglas, female   DOB: 1960/10/11, 55 y.o.   MRN: [id number]   [facility]  Patient name: Christine Douglas MRN [id number]  Date of birth: 1960/10/11  CC & HPI:  Christine Douglas is a 55 y.o. G3P3 Caucasian female presenting today for report of urinary frequency/pressure, started taking azo. Thinks she also has bacterial infection. Denies abnormal d/c, itching/odor/irritation. Sexually active ~q113mths.   No LMP recorded. Patient has had a hysterectomy. The current method of family planning is status post hysterectomy. Last pap n/a  Pertinent History Reviewed:  Medical & Surgical Hx:   Past medical, surgical, family, and social history reviewed in electronic medical record Medications: Reviewed & Updated - see associated section Allergies: Reviewed in electronic medical record  Objective Findings:  Vitals: BP 108/64 mmHg  Pulse 80  Wt 104 lb (47.174 kg) Body mass index is 17.84 kg/(m^2).  Physical Examination: General appearance - alert, well appearing, and in no distress Pelvic - cx surgically absent, small amt thin white slightly malodorous d/c Bimanual: no abnormalities/no tenderness  Results for orders placed or performed in visit on 10/03/15 (from the past 24 hour(s))  POCT Wet Prep Mellody Drown(Wet [place])   Collection Time: 10/03/15  3:08 PM  Result Value Ref Range   Source Wet Prep POC vaginal    WBC, Wet Prep HPF POC none    Bacteria Wet Prep HPF POC None None, Few, Too numerous to count   BACTERIA WET PREP MORPHOLOGY POC     Clue Cells Wet Prep HPF POC Few (A) None, Too numerous to count   Clue Cells Wet Prep Whiff POC Positive Whiff    Yeast Wet Prep HPF POC None    KOH Wet Prep POC     Trichomonas Wet Prep HPF POC none      Assessment & Plan:  A:   UTI  BV  P:  Rx bactrim ds bid x 7d for uti, urine culture pending  Rx metrogel qhs x 5night per preference for BV, no sex/etoh  GC/CT pending  Return for prn.  Marge DuncansBooker, Nevin Grizzle Randall CNM,  [facility]NP-BC 10/03/2015 3:14 PM

## 2015-10-04 ENCOUNTER — Telehealth: Payer: Self-pay

## 2015-10-04 LAB — GC/CHLAMYDIA PROBE AMP
CHLAMYDIA, DNA PROBE: NEGATIVE
NEISSERIA GONORRHOEAE BY PCR: NEGATIVE

## 2015-10-04 NOTE — Telephone Encounter (Signed)
Triaged pt.  

## 2015-10-04 NOTE — Telephone Encounter (Signed)
Pt called to say that her PCP referred her to have her first colonoscopy. [phone]

## 2015-10-05 NOTE — Telephone Encounter (Signed)
Gastroenterology Pre-Procedure Review  Request Date: 10/04/2015 Requesting Physician: Dr. Parke SimmersBland  PATIENT REVIEW QUESTIONS: The patient responded to the following health history questions as indicated:    1. Diabetes Melitis: no 2. Joint replacements in the past 12 months: no 3. Major health problems in the past 3 months: no 4. Has an artificial valve or MVP: no 5. Has a defibrillator: no 6. Has been advised in past to take antibiotics in advance of a procedure like teeth cleaning: no 7. Family history of colon cancer: no  8. Alcohol Use: Wine  occasionally 9. History of sleep apnea: no     MEDICATIONS & ALLERGIES:    Patient reports the following regarding taking any blood thinners:   Plavix? no Aspirin? no Coumadin? no  Patient confirms/reports the following medications:  Current Outpatient Prescriptions  Medication Sig Dispense Refill  . citalopram (CELEXA) 20 MG tablet Take 20 mg by mouth daily.    . diazepam (VALIUM) 5 MG tablet Take 5 mg by mouth daily as needed for anxiety.    Marland Kitchen. ibuprofen (ADVIL,MOTRIN) 200 MG tablet Take 200 mg by mouth as needed.    . metroNIDAZOLE (METROGEL VAGINAL) 0.75 % vaginal gel Place 1 Applicatorful vaginally at bedtime. X 5 nights, no alcohol or sex while using 70 g 0  . phenazopyridine (PYRIDIUM) 200 MG tablet Take 1 tablet (200 mg total) by mouth 3 (three) times daily as needed for pain. 10 tablet 0  . sulfamethoxazole-trimethoprim (BACTRIM DS,SEPTRA DS) 800-160 MG tablet Take 1 tablet by mouth 2 (two) times daily. X 7 days 14 tablet 0   No current facility-administered medications for this visit.    Patient confirms/reports the following allergies:  Allergies  Allergen Reactions  . Flexeril [Cyclobenzaprine] Swelling    Cannot take muscle relaxers -- swelling of legs and body     No orders of the defined types were placed in this encounter.    AUTHORIZATION INFORMATION Primary Insurance: ID #:  Group #:  Pre-Cert / Auth  required: Pre-Cert / Auth #:   Secondary Insurance:   ID #:   Group #:  Pre-Cert / Auth required:  Pre-Cert / Auth #:   SCHEDULE INFORMATION: Procedure has been scheduled as follows:  Date:  10/18/2015                   Time: 11:30 Am  [facility]  This Gastroenterology Pre-Precedure Review Form is being routed to the following provider(s): R. Roetta SessionsMichael Rourk, MD

## 2015-10-06 LAB — URINE CULTURE

## 2015-10-10 NOTE — Telephone Encounter (Signed)
I called pt and took off of the schedule for 10/18/2015.  OV with [name], NP on 11/20/2015 at 10:30 ( she requested a Monday) She also wanted me to mail her a note of her appt date and time. Asked me to use her parents address at this time ( and put in the computer) since she is staying with them and taking care of them.  [address], [place], [place]  [id number].

## 2015-10-10 NOTE — Telephone Encounter (Signed)
Will need OV due to medications (Celexa and Valium)

## 2015-10-24 ENCOUNTER — Telehealth: Payer: Self-pay | Admitting: Women's Health

## 2015-10-24 MED ORDER — METRONIDAZOLE 0.75 % VA GEL: 1.0000 | Freq: Every day | VAGINAL | Status: DC

## 2015-10-24 NOTE — Telephone Encounter (Signed)
Pt c/o reoccurring BV, states Metrogel helped but now she is having the same symptoms, also c/o urinary frequency after taking the Bactrim for UTI. Pt offered an appt for evaluation. Pt requesting refill for the Metrogel for now, if no improvement with in the week will call and make an appt.

## 2015-10-26 ENCOUNTER — Telehealth: Payer: Self-pay | Admitting: *Deleted

## 2015-10-26 NOTE — Telephone Encounter (Signed)
Pt informed antibiotic was not e-scribed she would need to make an appt for antibiotic. Pt states she will try med prescribed and if no improvement will call for an appt.

## 2015-10-30 DIAGNOSIS — F411 Generalized anxiety disorder: Secondary | ICD-10-CM | POA: Diagnosis not present

## 2015-10-31 DIAGNOSIS — F411 Generalized anxiety disorder: Secondary | ICD-10-CM | POA: Diagnosis not present

## 2015-10-31 DIAGNOSIS — F4312 Post-traumatic stress disorder, chronic: Secondary | ICD-10-CM | POA: Diagnosis not present

## 2015-11-01 ENCOUNTER — Other Ambulatory Visit (HOSPITAL_COMMUNITY): Payer: BLUE CROSS/BLUE SHIELD | Attending: Psychiatry | Admitting: Psychiatry

## 2015-11-01 ENCOUNTER — Encounter (HOSPITAL_COMMUNITY): Payer: Self-pay | Admitting: Psychiatry

## 2015-11-01 DIAGNOSIS — F900 Attention-deficit hyperactivity disorder, predominantly inattentive type: Secondary | ICD-10-CM | POA: Insufficient documentation

## 2015-11-01 DIAGNOSIS — F4310 Post-traumatic stress disorder, unspecified: Secondary | ICD-10-CM | POA: Diagnosis not present

## 2015-11-01 DIAGNOSIS — F331 Major depressive disorder, recurrent, moderate: Secondary | ICD-10-CM | POA: Diagnosis not present

## 2015-11-01 NOTE — Progress Notes (Signed)
Daily Group Progress Note  Program: IOP  Group Time: 10:45-12:00pm  Participation Level: Active  Behavioral Response: Appropriate  Type of Therapy:  Psychoeducation  Summary of Progress: Pt met with IOP case manager during psychoeducation   Jenkins Rouge, LCASA
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
## 2015-11-02 ENCOUNTER — Other Ambulatory Visit (HOSPITAL_COMMUNITY): Payer: BLUE CROSS/BLUE SHIELD | Admitting: Psychiatry

## 2015-11-02 DIAGNOSIS — F331 Major depressive disorder, recurrent, moderate: Secondary | ICD-10-CM | POA: Diagnosis not present

## 2015-11-02 DIAGNOSIS — F4310 Post-traumatic stress disorder, unspecified: Secondary | ICD-10-CM | POA: Diagnosis not present

## 2015-11-02 DIAGNOSIS — F900 Attention-deficit hyperactivity disorder, predominantly inattentive type: Secondary | ICD-10-CM | POA: Diagnosis not present

## 2015-11-03 ENCOUNTER — Other Ambulatory Visit (HOSPITAL_COMMUNITY): Payer: BLUE CROSS/BLUE SHIELD | Admitting: Psychiatry

## 2015-11-03 DIAGNOSIS — F331 Major depressive disorder, recurrent, moderate: Secondary | ICD-10-CM | POA: Diagnosis not present

## 2015-11-03 DIAGNOSIS — F4310 Post-traumatic stress disorder, unspecified: Secondary | ICD-10-CM | POA: Diagnosis not present

## 2015-11-03 DIAGNOSIS — F900 Attention-deficit hyperactivity disorder, predominantly inattentive type: Secondary | ICD-10-CM | POA: Diagnosis not present

## 2015-11-03 NOTE — Progress Notes (Signed)
    Daily Group Progress Note  Program: IOP   Time: 9:15-12:00 Participation Level: active Behavioral Response: engaged, responsive Type: Group Therapy Summary: Patient presented with a theme of anxiety and shame. Patient shared having issues with boundaries with her adult children. Patient also opened up about marital problems, and lack of trust and support from others. Patient mentioned feeling shame, from what she disclosed to the group. Therapist discussed the benefits of anxiety, and sitting with the anxiety. Therapist mentioned that part of the work is recognizing and validating feelings.  Shaune PollackBrown, Jennifer B, LPC

## 2015-11-06 ENCOUNTER — Other Ambulatory Visit (HOSPITAL_COMMUNITY): Payer: BLUE CROSS/BLUE SHIELD

## 2015-11-06 ENCOUNTER — Other Ambulatory Visit (HOSPITAL_COMMUNITY): Payer: Self-pay | Admitting: Medical

## 2015-11-06 ENCOUNTER — Other Ambulatory Visit (HOSPITAL_COMMUNITY): Payer: BLUE CROSS/BLUE SHIELD | Admitting: Psychiatry

## 2015-11-06 ENCOUNTER — Ambulatory Visit (INDEPENDENT_AMBULATORY_CARE_PROVIDER_SITE_OTHER): Payer: BLUE CROSS/BLUE SHIELD | Admitting: Medical

## 2015-11-06 ENCOUNTER — Encounter (HOSPITAL_COMMUNITY): Payer: Self-pay | Admitting: Medical

## 2015-11-06 VITALS — BP 110/66 | HR 78 | Ht 64.0 in | Wt 102.0 lb

## 2015-11-06 DIAGNOSIS — F331 Major depressive disorder, recurrent, moderate: Secondary | ICD-10-CM | POA: Diagnosis not present

## 2015-11-06 DIAGNOSIS — R6889 Other general symptoms and signs: Secondary | ICD-10-CM | POA: Diagnosis not present

## 2015-11-06 DIAGNOSIS — F4312 Post-traumatic stress disorder, chronic: Secondary | ICD-10-CM | POA: Diagnosis not present

## 2015-11-06 DIAGNOSIS — Z62810 Personal history of physical and sexual abuse in childhood: Secondary | ICD-10-CM

## 2015-11-06 DIAGNOSIS — T7432XA Child psychological abuse, confirmed, initial encounter: Secondary | ICD-10-CM | POA: Insufficient documentation

## 2015-11-06 DIAGNOSIS — T7432XS Child psychological abuse, confirmed, sequela: Secondary | ICD-10-CM

## 2015-11-06 DIAGNOSIS — G9782 Other postprocedural complications and disorders of nervous system: Secondary | ICD-10-CM

## 2015-11-06 DIAGNOSIS — M4322 Fusion of spine, cervical region: Secondary | ICD-10-CM | POA: Diagnosis not present

## 2015-11-06 DIAGNOSIS — F3130 Bipolar disorder, current episode depressed, mild or moderate severity, unspecified: Secondary | ICD-10-CM | POA: Insufficient documentation

## 2015-11-06 DIAGNOSIS — Z8659 Personal history of other mental and behavioral disorders: Secondary | ICD-10-CM | POA: Insufficient documentation

## 2015-11-06 DIAGNOSIS — Z818 Family history of other mental and behavioral disorders: Secondary | ICD-10-CM

## 2015-11-06 DIAGNOSIS — Z9889 Other specified postprocedural states: Secondary | ICD-10-CM

## 2015-11-06 MED ORDER — QUETIAPINE FUMARATE 100 MG PO TABS: ORAL_TABLET | ORAL | Status: DC
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
## 2015-11-08 ENCOUNTER — Other Ambulatory Visit (HOSPITAL_COMMUNITY): Payer: BLUE CROSS/BLUE SHIELD
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
## 2015-11-08 NOTE — Progress Notes (Signed)
    Daily Group Progress Note  Program: IOP  Group Time: 9:00-10:30  Participation Level: Active  Behavioral Response: Appropriate  Type of Therapy:  Group Therapy  Summary of Progress: Pt. Presented as hyperverbal, moderate anxiety, open to feedback from the group. Pt. Discussed her history of ADHD and difficulty focusing in groups. Pt. Discussed her process of acceptance of problems and general dissatisfaction in her marriage and process of deciding how she wants to move forward in her marriage.     Group Time: 10:30-12:00  Participation Level:  Active  Behavioral Response: Appropriate  Type of Therapy: Group Therapy  Summary of Progress: Pt. Discussed the role of grief and loss in the process of recovery from anxiety and depression.  Shaune PollackBrown, Akyah Lagrange B, LPC

## 2015-11-09 ENCOUNTER — Other Ambulatory Visit (HOSPITAL_COMMUNITY): Payer: BLUE CROSS/BLUE SHIELD | Attending: Psychiatry | Admitting: Psychiatry

## 2015-11-09 DIAGNOSIS — F331 Major depressive disorder, recurrent, moderate: Secondary | ICD-10-CM | POA: Diagnosis not present

## 2015-11-09 DIAGNOSIS — F4312 Post-traumatic stress disorder, chronic: Secondary | ICD-10-CM

## 2015-11-10 ENCOUNTER — Other Ambulatory Visit (HOSPITAL_COMMUNITY): Payer: BLUE CROSS/BLUE SHIELD | Admitting: Psychiatry

## 2015-11-10 DIAGNOSIS — F4312 Post-traumatic stress disorder, chronic: Secondary | ICD-10-CM

## 2015-11-10 DIAGNOSIS — F331 Major depressive disorder, recurrent, moderate: Secondary | ICD-10-CM | POA: Diagnosis not present
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
## 2015-11-13 ENCOUNTER — Other Ambulatory Visit (HOSPITAL_COMMUNITY): Payer: BLUE CROSS/BLUE SHIELD | Admitting: Psychiatry

## 2015-11-13 DIAGNOSIS — F331 Major depressive disorder, recurrent, moderate: Secondary | ICD-10-CM | POA: Diagnosis not present

## 2015-11-13 DIAGNOSIS — F4312 Post-traumatic stress disorder, chronic: Secondary | ICD-10-CM
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
## 2015-11-14 ENCOUNTER — Other Ambulatory Visit (HOSPITAL_COMMUNITY): Payer: BLUE CROSS/BLUE SHIELD | Admitting: Psychiatry

## 2015-11-14 DIAGNOSIS — F4312 Post-traumatic stress disorder, chronic: Secondary | ICD-10-CM

## 2015-11-14 DIAGNOSIS — F331 Major depressive disorder, recurrent, moderate: Secondary | ICD-10-CM | POA: Diagnosis not present

## 2015-11-14 NOTE — Progress Notes (Signed)
    Daily Group Progress Note  Program: IOP  Group Time: 9:00-10:30  Participation Level: Active  Behavioral Response: Appropriate  Type of Therapy:  Group Therapy  Summary of Progress: Pt. Presented as much more calm and less anxious. Pt. Met for one hour with Dr. Lovena Le regarding recent medication changes and bipolar diagnosis. Pt. Discussed enhancing her self care by going to the pool, exercising, and attention to her diet to address recent weight loss. Pt. Reported that she has been under great stress due to caregiving responsibilities and problems in her marriage, but is focusing on her self-care and spirituality to cope with the stress.    Nancie Neas, LPC

## 2015-11-14 NOTE — Progress Notes (Signed)
Daily Group Progress Note  Program: IOP    Group Time: 9:00-12:00   Participation Level: Active   Behavioral Response: Engaged   Type of Therapy: Group therapy   Summary of Progress: Client was alert and active during group.  The pharmacist came to speak with the group.  The client discussed her new medication with the pharmacist, including possible side effects.  The client stated she is comfortable with her new medications.  The client discussed opportunities she took over the weekend to practice self-care.  She is continuing to work on setting and maintaining boundaries, specifically with her children and parents.  The counselor encouraged the client to continue to work on self-care and boundaries.   Vernona RiegerLisbeth S. Rip Hawes, LCASA

## 2015-11-15 ENCOUNTER — Other Ambulatory Visit (HOSPITAL_COMMUNITY): Payer: BLUE CROSS/BLUE SHIELD | Admitting: Psychiatry

## 2015-11-15 DIAGNOSIS — F4312 Post-traumatic stress disorder, chronic: Secondary | ICD-10-CM

## 2015-11-15 DIAGNOSIS — F331 Major depressive disorder, recurrent, moderate: Secondary | ICD-10-CM | POA: Diagnosis not present

## 2015-11-15 NOTE — Progress Notes (Signed)
Daily Group Progress Note  Program: IOP   Group Time: 10:45-12:00pm   Participation Level: Active   Behavioral Response: Engaged   Type of Therapy:  Group therapy   Summary of Progress: Pt participated in a discussion about communication: listening, hearing, talking and processing. Pt was encouraged to communication effectively, concisely, and confidently.     Araina Butrick S. Jess Sulak, LCASA 

## 2015-11-16 ENCOUNTER — Other Ambulatory Visit (HOSPITAL_COMMUNITY): Payer: BLUE CROSS/BLUE SHIELD

## 2015-11-16 NOTE — Progress Notes (Signed)
    Daily Group Progress Note  Program: IOP  Group Time: 9:00-10:30  Participation Level: Active  Behavioral Response: Appropriate  Type of Therapy:  Group Therapy  Summary of Progress: Pt. Presented as talkative, bright affect. Pt. Shared with the group that she had not felt like coming to group today because she wanted to stay at home in her sweats and pajamas. Pt. Reported that she consciously made a different choice to put on a dress and makeup so that she would feel better and have motivation to come to group.      Shaune PollackBrown, Joceline Hinchcliff B, LPC

## 2015-11-17 ENCOUNTER — Other Ambulatory Visit (HOSPITAL_COMMUNITY): Payer: BLUE CROSS/BLUE SHIELD | Admitting: Psychiatry

## 2015-11-17 DIAGNOSIS — F4312 Post-traumatic stress disorder, chronic: Secondary | ICD-10-CM

## 2015-11-17 DIAGNOSIS — F331 Major depressive disorder, recurrent, moderate: Secondary | ICD-10-CM | POA: Diagnosis not present

## 2015-11-20 ENCOUNTER — Ambulatory Visit: Payer: BLUE CROSS/BLUE SHIELD | Admitting: Gastroenterology

## 2015-11-20 ENCOUNTER — Other Ambulatory Visit (HOSPITAL_COMMUNITY): Payer: BLUE CROSS/BLUE SHIELD | Admitting: Licensed Clinical Social Worker

## 2015-11-20 DIAGNOSIS — F3130 Bipolar disorder, current episode depressed, mild or moderate severity, unspecified: Secondary | ICD-10-CM

## 2015-11-20 DIAGNOSIS — F900 Attention-deficit hyperactivity disorder, predominantly inattentive type: Secondary | ICD-10-CM | POA: Diagnosis not present

## 2015-11-20 DIAGNOSIS — F310 Bipolar disorder, current episode hypomanic: Secondary | ICD-10-CM | POA: Diagnosis not present

## 2015-11-20 DIAGNOSIS — G43009 Migraine without aura, not intractable, without status migrainosus: Secondary | ICD-10-CM | POA: Diagnosis not present

## 2015-11-20 DIAGNOSIS — F331 Major depressive disorder, recurrent, moderate: Secondary | ICD-10-CM | POA: Diagnosis not present

## 2015-11-20 NOTE — Progress Notes (Signed)
Daily Group Progress Note  Program: IOP  Group Time: 9:00-12:00  Participation Level: Active  Behavioral Response: Appropriate  Type of Therapy:  Psychoeducation/Group Therapy  Summary of Progress: Pt participated in a presentation by [facility] pharmacist on medication management and asked appropriate medication questions. The second part of group focused on a coping skill for depression. Pt participated in a discussion about how pets can be used as therapy. Pt shared how pets have assisted in diminishing depressive symptoms.  
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
## 2015-11-21 ENCOUNTER — Other Ambulatory Visit (HOSPITAL_COMMUNITY): Payer: BLUE CROSS/BLUE SHIELD | Admitting: Psychiatry

## 2015-11-21 DIAGNOSIS — F331 Major depressive disorder, recurrent, moderate: Secondary | ICD-10-CM | POA: Diagnosis not present

## 2015-11-21 DIAGNOSIS — F3130 Bipolar disorder, current episode depressed, mild or moderate severity, unspecified: Secondary | ICD-10-CM

## 2015-11-21 NOTE — Progress Notes (Signed)
    Daily Group Progress Note  Program: IOP  Group Time: 9:00-11:00  Participation Level: Active  Behavioral Response: Appropriate  Type of Therapy:  Group Therapy  Summary of Progress: Pt. Continues to be hyperverbal, but is responsive to frequent redirection. Pt. Stated her awareness that she needs to have boundaries and ability to say "No" to her mother, but is very challenged to implement boundaries because she does not believe that she is entitled to do so.      Shaune PollackBrown, Camran Keady B, LPC

## 2015-11-21 NOTE — Progress Notes (Signed)
Daily Group Progress Note  Program: IOP  Group Time: 10:45-12:00pm  Participation Level: Active  Behavioral Response: Appropriate  Type of Therapy:  Psychotherapy  Summary of Progress:  Pt participated in a discussion on healthy boundaries. Pt was encouraged to set personal boundaries with limits and rules especially with her mother, which encourages mental wellness.

## 2015-11-22 ENCOUNTER — Other Ambulatory Visit (HOSPITAL_COMMUNITY): Payer: BLUE CROSS/BLUE SHIELD | Admitting: Licensed Clinical Social Worker

## 2015-11-22 DIAGNOSIS — F3130 Bipolar disorder, current episode depressed, mild or moderate severity, unspecified: Secondary | ICD-10-CM

## 2015-11-22 DIAGNOSIS — F331 Major depressive disorder, recurrent, moderate: Secondary | ICD-10-CM | POA: Diagnosis not present
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
## 2015-11-22 NOTE — Progress Notes (Signed)
Daily Group Progress Note  Program: IOP  Group Time: 10:45-12:00pm  Participation Level: Active  Behavioral Response: Appropriate  Type of Therapy:  Psychotherapy  Summary of Progress:  Pt participated in chair yoga, facilitated by [facility] therapist and certified yoga instructor Leanne Yates. The activity focused on breath with movement. For patients with anxiety and depression as part of self-care and mindfulness, yoga helps train the relaxation response.    [name]. [name], LCAS-A   

## 2015-11-23 ENCOUNTER — Other Ambulatory Visit (HOSPITAL_COMMUNITY): Payer: BLUE CROSS/BLUE SHIELD | Admitting: Psychiatry

## 2015-11-23 DIAGNOSIS — F3130 Bipolar disorder, current episode depressed, mild or moderate severity, unspecified: Secondary | ICD-10-CM

## 2015-11-23 DIAGNOSIS — F331 Major depressive disorder, recurrent, moderate: Secondary | ICD-10-CM | POA: Diagnosis not present

## 2015-11-24 ENCOUNTER — Other Ambulatory Visit (HOSPITAL_COMMUNITY): Payer: BLUE CROSS/BLUE SHIELD | Admitting: Psychiatry

## 2015-11-24 DIAGNOSIS — F331 Major depressive disorder, recurrent, moderate: Secondary | ICD-10-CM | POA: Diagnosis not present

## 2015-11-24 DIAGNOSIS — F3130 Bipolar disorder, current episode depressed, mild or moderate severity, unspecified: Secondary | ICD-10-CM
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
## 2015-11-27 ENCOUNTER — Other Ambulatory Visit (HOSPITAL_COMMUNITY): Payer: BLUE CROSS/BLUE SHIELD | Admitting: Licensed Clinical Social Worker

## 2015-11-27 DIAGNOSIS — F3130 Bipolar disorder, current episode depressed, mild or moderate severity, unspecified: Secondary | ICD-10-CM

## 2015-11-27 DIAGNOSIS — F331 Major depressive disorder, recurrent, moderate: Secondary | ICD-10-CM | POA: Diagnosis not present

## 2015-11-27 NOTE — Progress Notes (Signed)
Christine Douglas is a 55 y.o. female patient who Probation officer met with per her request.  Pt had c/o certain peer in the group.  States pt is talking excessively in the group.  Pt also voiced she blacked out twice over the weekend whenever she took one who Seroquel.  "I only took half the tablet lastnight and did better."  Reports her mind is not racing and she's sleeping better when she takes it.  A:  Encouraged pt to discuss situation with peer in the group.  Will schedule pt to discuss medication with Dr. Lovena Le tomorrow.  R:  Pt receptive.        Carlis Abbott, RITA, M,Ed, CNA

## 2015-11-28 ENCOUNTER — Other Ambulatory Visit (HOSPITAL_COMMUNITY): Payer: BLUE CROSS/BLUE SHIELD | Admitting: Psychiatry

## 2015-11-28 DIAGNOSIS — F3130 Bipolar disorder, current episode depressed, mild or moderate severity, unspecified: Secondary | ICD-10-CM

## 2015-11-28 DIAGNOSIS — F331 Major depressive disorder, recurrent, moderate: Secondary | ICD-10-CM | POA: Diagnosis not present

## 2015-11-28 NOTE — Progress Notes (Signed)
    Daily Group Progress Note  Program: IOP    Time: 9:00-12:00 Activity Level: active Behavioral Response: engaged, responsive Type: Group Therapy Summary: Patient demonstrated an overwhelmed theme this morning. Patient stated that she really struggled to communicate in her relationship with her mother. Therapist educated the group about I statements, and how they could enhance communication in relationships Shaune Pollack, [facility]
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
## 2015-11-28 NOTE — Progress Notes (Signed)
Daily Group Progress Note     IOP     Time: 9:00-12:00  Activity Level: active  Behavioral Response: engaged, responsive  Type of Therapy: Psychoeducation/Group therapy  Summary of Progress: Patient was involved with discussion about current medication. Patient listened to the suggestions of the pharmacist. Patient shared about how she has been affected in the group, and the group reviewed group expectations. Patient also shared about a conflicting relationship with her daughter. Therapist encouraged her to use I statements when communications with her daughter.

## 2015-11-29 ENCOUNTER — Telehealth (HOSPITAL_COMMUNITY): Payer: Self-pay | Admitting: Psychiatry

## 2015-11-29 ENCOUNTER — Other Ambulatory Visit (HOSPITAL_COMMUNITY): Payer: BLUE CROSS/BLUE SHIELD | Admitting: Psychiatry

## 2015-11-29 NOTE — Patient Instructions (Signed)
Pt completed [facility]-IOP today (although) she didn't attend today.  Will d/c pt through the mail.  F/U with Dr. Michae Kava on 12-26-15 @ 8:15 a.m.Marland Kitchen  Pt to call Dr. Lennette Bihari for a f/u appointment.  Encouraged support groups.

## 2015-11-29 NOTE — Progress Notes (Signed)
Patient ID: Christine Douglas, female   DOB: 06/09/1960, 55 y.o.   MRN: [id number] Ms Christine Douglas attended and participated in the program.  She said that the IOP was not helpful.  The people coming and going were very disruptive to her.  She was aware of the coping skills already.  She stuck it out to give it a good chance.  Final diagnosis major depression recurrent moderate Mental status over the phone and in person the day before indicated no suicidal thoughts or intent  will follow up with Dr Parke Simmers and likely seek a new therapist and a  Therapist, sports.
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
## 2015-11-29 NOTE — Progress Notes (Signed)
    Daily Group Progress Note  Program: IOP   Time: 9:00-10:45 Activity Level: active Behavioral Response: engaged, responsive Type: Group Therapy Summary: Patient shared that she was having problems sleeping, and problems with her current medication. Therapist educated group about the importance of sleep hygiene, and changing our schedule of taking medications. Therapist also asked the group how they wanted to feel, and what behaviors make them feel that way.  Shaune Pollack, LPC

## 2015-11-29 NOTE — Progress Notes (Signed)
Daily Group Progress Notes   Time: 10:45-12:00 Activity Level: active Behavioral Response: engaged, responsive Type: Group Summary: Therapist asked the group to share what others told them about their depression. Therapist asked the group to draw their depression, and to potentially share the drawing with those people. Patient shared that she did not have any supports, and that there was no one who wanted to understand about her depression.    Vernona Rieger, LCAS-A

## 2015-11-30 ENCOUNTER — Ambulatory Visit: Payer: BLUE CROSS/BLUE SHIELD | Admitting: Gastroenterology

## 2015-11-30 ENCOUNTER — Other Ambulatory Visit (HOSPITAL_COMMUNITY): Payer: BLUE CROSS/BLUE SHIELD

## 2015-11-30 ENCOUNTER — Telehealth: Payer: Self-pay | Admitting: Gastroenterology

## 2015-11-30 NOTE — Telephone Encounter (Signed)
Pt was a no show

## 2015-12-01 ENCOUNTER — Other Ambulatory Visit (HOSPITAL_COMMUNITY): Payer: BLUE CROSS/BLUE SHIELD

## 2015-12-04 ENCOUNTER — Other Ambulatory Visit (HOSPITAL_COMMUNITY): Payer: BLUE CROSS/BLUE SHIELD

## 2015-12-05 ENCOUNTER — Other Ambulatory Visit (HOSPITAL_COMMUNITY): Payer: BLUE CROSS/BLUE SHIELD

## 2015-12-06 ENCOUNTER — Ambulatory Visit: Payer: BLUE CROSS/BLUE SHIELD | Admitting: Advanced Practice Midwife

## 2015-12-06 ENCOUNTER — Other Ambulatory Visit (HOSPITAL_COMMUNITY): Payer: BLUE CROSS/BLUE SHIELD

## 2015-12-07 ENCOUNTER — Ambulatory Visit (INDEPENDENT_AMBULATORY_CARE_PROVIDER_SITE_OTHER): Payer: BLUE CROSS/BLUE SHIELD | Admitting: Neurology

## 2015-12-07 ENCOUNTER — Encounter: Payer: Self-pay | Admitting: Neurology

## 2015-12-07 ENCOUNTER — Other Ambulatory Visit (HOSPITAL_COMMUNITY): Payer: BLUE CROSS/BLUE SHIELD

## 2015-12-07 VITALS — BP 102/58 | HR 78 | Resp 14 | Ht 64.0 in | Wt 101.0 lb

## 2015-12-07 DIAGNOSIS — G43009 Migraine without aura, not intractable, without status migrainosus: Secondary | ICD-10-CM

## 2015-12-07 DIAGNOSIS — Z658 Other specified problems related to psychosocial circumstances: Secondary | ICD-10-CM | POA: Diagnosis not present

## 2015-12-07 DIAGNOSIS — F439 Reaction to severe stress, unspecified: Secondary | ICD-10-CM

## 2015-12-07 MED ORDER — GABAPENTIN 100 MG PO CAPS: ORAL_CAPSULE | ORAL | 5 refills | Status: DC

## 2015-12-07 NOTE — Progress Notes (Signed)
Subjective:    Patient ID: Christine Douglas is a 55 y.o. female.  HPI     Huston Foley, MD, PhD [facility] [address] [address] [place], [place] [address]  Dear Dr. Parke Simmers,   I saw your patient, Christine Douglas, upon your kind request in my neurologic clinic today for initial consultation of her headaches. The patient is unaccompanied today. As you know, Christine Douglas is a 55 year old right-handed woman with an underlying medical history of bipolar disorder, history of PTSD, followed by mental health, history of neck surgery, status post rotator cuff repair, history of ADHD, and allergies, who reports recurrent headaches for the past many years. I reviewed your office note from 10/31/2015. I also reviewed recent blood work through your office: This was from 08/24/2015, CBC with differential was unremarkable, cholesterol panel showed total cholesterol of 194, triglycerides 289, LDL 78, TSH normal at 1.7, free T4 normal, free T3 normal.  Of note, she is on multiple psychotropic medications including Valium prn, Celexa 20 mg daily, Seroquel 50 mg qHS, and Vyvanse 30 mg. Of note, she had a cervical spine MRI without contrast on 09/07/2012 which I reviewed: 1.  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  The finding is unchanged since 09/27/2011. 2.  Marked decrease in the small disc protrusion at C6-7 with decreased and only very slight impingement upon the left lateral recess which might affect the left C7 nerve.  She reports some stressors. She has been caring for her elderly parents. Stays with them, going through her divorce. Currently, she only takes Tylenol for her migraines. Frequency about 1/week. She has an occasional aura with a glaze in front of her vision. She has been drinking more water. She has nausea and some vomiting with a migraine, which is mostly R frontal. She has photophobia, no one sided weakness and  numbness/or tingling.   She has a family history of migraines in her sister and her mother. She also has a family history of mood disorders, brother committed suicide, mother had a suicide attempt, patient herself had a suicide attempt at age 21. She has recently been in behavioral health. She is doing better. She had some recent medication changes, most recently, Seroquel was added, she is supposed to take 200 mg at night but has been taking 50 mg at night, when she first started it at 100 mg at night she got up at night and fell. She bumped her knees.  She quit smoking some 30 years ago, she does not drink alcohol, she does not use illicit drugs, she drinks caffeine in the form of coffee, one cup in the morning, she cannot take ibuprofen because it upsets her stomach.  Years ago she tried Imitrex. She has not been on any migraine prevention medication. She has not had a brain MRI.  Her Past Medical History Is Significant For: Past Medical History:  Diagnosis Date  . ADHD (attention deficit hyperactivity disorder)   . Anxiety   . BV (bacterial vaginosis)   . Depression   . Headache   . Neuromuscular disorder (HCC)    nerve damage C6  C7    Her Past Surgical History Is Significant For: Past Surgical History:  Procedure Laterality Date  . ABDOMINAL HYSTERECTOMY     partial hysterectomy  . BACK SURGERY     spine surgery; 2 screws and plate in neck  . CESAREAN SECTION    .  SHOULDER ARTHROSCOPY WITH ROTATOR CUFF REPAIR AND SUBACROMIAL DECOMPRESSION  06/04/2012   Procedure: SHOULDER ARTHROSCOPY WITH ROTATOR CUFF REPAIR AND SUBACROMIAL DECOMPRESSION;  Surgeon: Loreta Ave, MD;  Location: [facility];  Service: Orthopedics;  Laterality: Left;  LEFT ARTHROSCOPY SHOULDER DECOMPRESSION SUBACROMIAL PARTIAL ACROMIOPLASTY WITH CORACOACROMIAL RELEASE, DISTAL CLAVICULECTOMY,  ROTATOR CUFF REPAIR     Her Family History Is Significant For: Family History  Problem Relation Age of Onset   . Hyperlipidemia Mother   . Thyroid disease Mother   . Other Brother     suicide  . Bipolar disorder Son   . Cancer Other     Her Social History Is Significant For: Social History   Social History  . Marital status: Divorced    Spouse name: N/A  . Number of children: N/A  . Years of education: 56   Social History Main Topics  . Smoking status: Former Games developer  . Smokeless tobacco: Never Used  . Alcohol use Yes     Comment: wine occassionally  . Drug use: No  . Sexual activity: Yes    Birth control/ protection: Surgical   Other Topics Concern  . None   Social History Narrative   Drinks 1 cup of coffee a day     Her Allergies Are:  Allergies  Allergen Reactions  . Anesthetics, Amide Anaphylaxis    Neck surgery pt unsure of exact anesthetic-afraid to have any further surgeries  . Flexeril [Cyclobenzaprine] Swelling    Cannot take muscle relaxers -- swelling of legs and body   :   Her Current Medications Are:  Outpatient Encounter Prescriptions as of 12/07/2015  Medication Sig  . citalopram (CELEXA) 20 MG tablet Take 20 mg by mouth daily.  . diazepam (VALIUM) 10 MG tablet Take 10 mg by mouth 3 (three) times daily.  . diazepam (VALIUM) 5 MG tablet Take 5 mg by mouth daily as needed for anxiety.  . phenazopyridine (PYRIDIUM) 200 MG tablet Take 1 tablet (200 mg total) by mouth 3 (three) times daily as needed for pain.  Marland Kitchen QUEtiapine (SEROQUEL) 100 MG tablet Take 2 tablets at bed time  . VYVANSE 30 MG capsule Take 30 mg by mouth daily.  . [DISCONTINUED] ibuprofen (ADVIL,MOTRIN) 200 MG tablet Take 200 mg by mouth as needed.  . [DISCONTINUED] metroNIDAZOLE (METROGEL VAGINAL) 0.75 % vaginal gel Place 1 Applicatorful vaginally at bedtime. X 5 nights, no alcohol or sex while using  . [DISCONTINUED] sulfamethoxazole-trimethoprim (BACTRIM DS,SEPTRA DS) 800-160 MG tablet Take 1 tablet by mouth 2 (two) times daily. X 7 days   No facility-administered encounter medications on file  as of 12/07/2015.   :   Review of Systems:  Out of a complete 14 point review of systems, all are reviewed and negative with the exception of these symptoms as listed below: Review of Systems  Neurological:       Patient reports that she has had migraines for years. She only takes Tylenol for them now. Lives and cares for her parents so unable to "shut down" when she has a migraine.  H/O neck surgery. States that she is "different" after her neck surgery.     Objective:  Neurologic Exam  Physical Exam Physical Examination:   Vitals:   12/07/15 1437  BP: (!) 102/58  Pulse: 78  Resp: 14    General Examination: The patient is a very pleasant 56 y.o. female in no acute distress. She appears well-developed and well-nourished and well groomed.   HEENT: Normocephalic,  atraumatic, pupils are equal, round and reactive to light and accommodation. Funduscopic exam is normal with sharp disc margins noted. Extraocular tracking is good without limitation to gaze excursion or nystagmus noted. Normal smooth pursuit is noted. Hearing is grossly intact. Tympanic membranes are clear bilaterally. Face is symmetric with normal facial animation and normal facial sensation. Speech is clear with no dysarthria noted. There is no hypophonia. There is no lip, neck/head, jaw or voice tremor. Neck is supple with full range of passive and active motion. There are no carotid bruits on auscultation. Oropharynx exam reveals: mild mouth dryness, good dental hygiene and mild airway crowding, due to smaller. Mallampati is class II. Tongue protrudes centrally and palate elevates symmetrically.   Chest: Clear to auscultation without wheezing, rhonchi or crackles noted.  Heart: S1+S2+0, regular and normal without murmurs, rubs or gallops noted.   Abdomen: Soft, non-tender and non-distended with normal bowel sounds appreciated on auscultation.  Extremities: There is no pitting edema in the distal lower extremities  bilaterally. Pedal pulses are intact.  Skin: Warm and dry without trophic changes noted. There are no varicose veins.  Musculoskeletal: exam reveals no obvious joint deformities, tenderness or joint swelling or erythema.   Neurologically:  Mental status: The patient is awake, alert and oriented in all 4 spheres. Her immediate and remote memory, attention, language skills and fund of knowledge are appropriate. There is no evidence of aphasia, agnosia, apraxia or anomia. Speech is clear with normal prosody and enunciation. Thought process is linear. Mood is normal and affect is normal.  Cranial nerves II - XII are as described above under HEENT exam. In addition: shoulder shrug is normal with equal shoulder height noted. Motor exam: Normal bulk, strength and tone is noted. There is no drift, tremor or rebound. Romberg is negative. Reflexes are 2 to 3+ throughout. Babinski: Toes are flexor bilaterally. Fine motor skills and coordination: intact with normal finger taps, normal hand movements, normal rapid alternating patting, normal foot taps and normal foot agility.  Cerebellar testing: No dysmetria or intention tremor on finger to nose testing. Heel to shin is unremarkable bilaterally. There is no truncal or gait ataxia.  Sensory exam: intact to light touch, pinprick, vibration, temperature sense in the upper and lower extremities.  Gait, station and balance: She stands easily. No veering to one side is noted. No leaning to one side is noted. Posture is age-appropriate and stance is narrow based. Gait shows normal stride length and normal pace. No problems turning are noted. Tandem walk is unremarkable. She was wearing wedge heels, but I asked she take her sandals off for tandem walk and Romberg.   Assessment and Plan:  In summary, Abbott Sartor Manson Douglas is a very pleasant 55 y.o.-year old female with an underlying medical history of bipolar disorder, history of PTSD, followed by mental health, history of neck  surgery, status post rotator cuff repair, history of ADHD, and allergies, who Has a long-standing history of migraines, without and with auras at times. Associated symptoms include nausea, occasional vomiting, photophobia, but thankfully no complicating factors such as one-sided weakness, numbness, slurring of speech, droopy face. We talked about migraine abortive and preventive medication, she has been on multiple psychotropic medications and there may be limited options for migraine prevention medications. Topamax would not be a good idea because of her low weight, verapamil or other blood pressure medications or a beta blocker could lower her blood pressure too low and beta blockers can exacerbate depression. Depakote may cause mood  disturbance and interfere with her current mood medication. I would not recommend yet another antidepressant such as amitriptyline or nortriptyline as she is already on an SSRI. For now, we mutually agreed to try low-dose gabapentin starting with 100 mg at night for 1 week with gradual titration to 100 mg 3 times a day. I provided a new prescription and written instructions.  I had a long chat with the patient about my findings and the diagnosis of migraines without aura, the prognosis and treatment options. We talked about medical treatments and non-pharmacological approaches. We talked about maintaining a  healthy lifestyle in general. I encouraged the patient to eat healthy, exercise daily and keep well hydrated, to keep a scheduled bedtime and wake time routine, to not skip any meals and eat healthy snacks in between meals and to have protein with every meal.   I advised the patient about common headache triggers: sleep deprivation, dehydration, overheating, stress, hypoglycemia or skipping meals and blood sugar fluctuations, excessive pain medications or excessive alcohol use or caffeine withdrawal. Some people have food triggers such as aged cheese, orange juice or chocolate,  especially dark chocolate, or MSG (monosodium glutamate). She is to try to avoid these headache triggers as much possible. It may be helpful to keep a headache diary to figure out what makes Her headaches worse or brings them on and what alleviates them. Some people report headache onset after exercise but studies have shown that regular exercise may actually prevent headaches from coming. If She has exercise-induced headaches, She is advised to drink plenty of fluid before and after exercising and that to not overdo it and to not overheat.  As far as further diagnostic testing is concerned, I suggested the following today: MRI brain w and w/o Gad, we will call her with her test results. Thankfully, physical and neurological exam nonfocal.  I would like to see her back in 3 months, sooner as needed. I answered all her questions today and she was in agreement.  Thank you very much for allowing me to participate in the care of this nice patient. If I can be of any further assistance to you please do not hesitate to call me at [phone].  Sincerely,   Huston Foley, MD, PhD

## 2015-12-07 NOTE — Patient Instructions (Signed)
Please remember, common headache triggers are: sleep deprivation, dehydration, overheating, stress, hypoglycemia or skipping meals and blood sugar fluctuations, excessive pain medications or excessive alcohol use or caffeine withdrawal. Some people have food triggers such as aged cheese, orange juice or chocolate, especially dark chocolate, or MSG (monosodium glutamate). Try to avoid these headache triggers as much possible. It may be helpful to keep a headache diary to figure out what makes your headaches worse or brings them on and what alleviates them. Some people report headache onset after exercise but studies have shown that regular exercise may actually prevent headaches from coming. If you have exercise-induced headaches, please make sure that you drink plenty of fluid before and after exercising and that you do not over do it and do not overheat.  We will do a brain scan, called MRI and call you with the test results. We will have to schedule you for this on a separate date. This test requires authorization from your insurance, and we will take care of the insurance process.  For migraine prevention, we will start Neurontin (gabapentin) 100 mg strength: Take 1 pill nightly at bedtime for 1 week, then 1 pill 2 times a day for 1 week, then 1 pill 3 times a day thereafter.   The most common side effects reported are sedation or sleepiness. Rare side effects include balance problems, confusion.

## 2015-12-08 ENCOUNTER — Other Ambulatory Visit (HOSPITAL_COMMUNITY): Payer: BLUE CROSS/BLUE SHIELD

## 2015-12-11 ENCOUNTER — Telehealth: Payer: Self-pay | Admitting: *Deleted

## 2015-12-11 ENCOUNTER — Other Ambulatory Visit (HOSPITAL_COMMUNITY): Payer: Self-pay | Admitting: Medical

## 2015-12-11 ENCOUNTER — Other Ambulatory Visit (HOSPITAL_COMMUNITY): Payer: BLUE CROSS/BLUE SHIELD

## 2015-12-11 NOTE — Telephone Encounter (Signed)
Pt states she thinks she may have a UTI, urinary frequency. Offered pt to come and give urine sample for urine culture. Pt states she has a scheduled an appt with PCP tomorrow and will follow up with them.

## 2015-12-12 ENCOUNTER — Other Ambulatory Visit (HOSPITAL_COMMUNITY): Payer: BLUE CROSS/BLUE SHIELD

## 2015-12-13 ENCOUNTER — Other Ambulatory Visit (HOSPITAL_COMMUNITY): Payer: BLUE CROSS/BLUE SHIELD

## 2015-12-13 ENCOUNTER — Other Ambulatory Visit (HOSPITAL_COMMUNITY): Payer: Self-pay | Admitting: Medical

## 2015-12-13 ENCOUNTER — Other Ambulatory Visit (HOSPITAL_COMMUNITY): Payer: Self-pay

## 2015-12-13 ENCOUNTER — Telehealth (HOSPITAL_COMMUNITY): Payer: Self-pay

## 2015-12-13 DIAGNOSIS — F4312 Post-traumatic stress disorder, chronic: Secondary | ICD-10-CM

## 2015-12-13 DIAGNOSIS — F3130 Bipolar disorder, current episode depressed, mild or moderate severity, unspecified: Secondary | ICD-10-CM

## 2015-12-13 MED ORDER — QUETIAPINE FUMARATE 100 MG PO TABS: ORAL_TABLET | ORAL | 0 refills | Status: DC

## 2015-12-13 NOTE — Telephone Encounter (Signed)
Patients pharmacy is calling for a refill on Seroquel 100 mg 2 po qhs. Patient finished IOP and has a follow up on 8/23 with Dr. Michae KavaAgarwal. Please review and advise, thank you

## 2015-12-13 NOTE — Telephone Encounter (Signed)
rx sent to pharm on  chart

## 2015-12-14 ENCOUNTER — Other Ambulatory Visit (HOSPITAL_COMMUNITY): Payer: BLUE CROSS/BLUE SHIELD

## 2015-12-14 NOTE — Telephone Encounter (Signed)
Okay, thank you!

## 2015-12-15 ENCOUNTER — Other Ambulatory Visit (HOSPITAL_COMMUNITY): Payer: BLUE CROSS/BLUE SHIELD

## 2015-12-18 ENCOUNTER — Encounter: Payer: Self-pay | Admitting: Women's Health

## 2015-12-18 ENCOUNTER — Ambulatory Visit (INDEPENDENT_AMBULATORY_CARE_PROVIDER_SITE_OTHER): Payer: BLUE CROSS/BLUE SHIELD | Admitting: Women's Health

## 2015-12-18 ENCOUNTER — Other Ambulatory Visit (HOSPITAL_COMMUNITY): Payer: BLUE CROSS/BLUE SHIELD

## 2015-12-18 VITALS — BP 102/72 | HR 72 | Wt 104.0 lb

## 2015-12-18 DIAGNOSIS — N390 Urinary tract infection, site not specified: Secondary | ICD-10-CM | POA: Diagnosis not present

## 2015-12-18 DIAGNOSIS — R300 Dysuria: Secondary | ICD-10-CM

## 2015-12-18 LAB — POCT URINALYSIS DIPSTICK
Blood, UA: NEGATIVE
GLUCOSE UA: NEGATIVE
Ketones, UA: NEGATIVE
Leukocytes, UA: NEGATIVE
NITRITE UA: POSITIVE
Protein, UA: NEGATIVE

## 2015-12-18 MED ORDER — NITROFURANTOIN MONOHYD MACRO 100 MG PO CAPS: 100.0000 mg | ORAL_CAPSULE | Freq: Two times a day (BID) | ORAL | 0 refills | Status: DC

## 2015-12-18 NOTE — Progress Notes (Signed)
   [facility] Visit  Patient name: [name] MRN [id number]  Date of birth: 1960-07-18  CC & HPI:  [name] is a 55 y.o. G3P3 Caucasian female presenting today for report of strong odor to urine, frequent urinary urge. Has been getting in hot tub at parents house that hasn't been used in years. Did clean it out w/ clorox.  No LMP recorded. Patient has had a hysterectomy. The current method of family planning is status post hysterectomy. Last pap n/a  Pertinent History Reviewed:  Medical & Surgical Hx:   Past medical, surgical, family, and social history reviewed in electronic medical record Medications: Reviewed & Updated - see associated section Allergies: Reviewed in electronic medical record  Objective Findings:  Vitals: BP 102/72 (BP Location: Right Arm, Patient Position: Sitting, Cuff Size: Normal)   Pulse 72   Wt 104 lb (47.2 kg)   BMI 17.85 kg/m  Body mass index is 17.85 kg/m.  Physical Examination: General appearance - alert, well appearing, and in no distress  Results for orders placed or performed in visit on 12/18/15 (from the past 24 hour(s))  POCT urinalysis dipstick   Collection Time: 12/18/15 11:18 AM  Result Value Ref Range   Color, UA     Clarity, UA     Glucose, UA neg    Bilirubin, UA     Ketones, UA neg    Spec Grav, UA     Blood, UA neg    pH, UA     Protein, UA neg    Urobilinogen, UA     Nitrite, UA positive    Leukocytes, UA Negative Negative     Assessment & Plan:  A:   UTI  P:  Rx macrobid bid x 7d  Send urine for cx  Return prn.  Marge DuncansBooker, Judithann Villamar Randall CNM, [facility] Center[facility]NP-BC 12/18/2015 11:46 AM

## 2015-12-19 ENCOUNTER — Other Ambulatory Visit (HOSPITAL_COMMUNITY): Payer: BLUE CROSS/BLUE SHIELD

## 2015-12-20 ENCOUNTER — Other Ambulatory Visit (HOSPITAL_COMMUNITY): Payer: BLUE CROSS/BLUE SHIELD

## 2015-12-21 ENCOUNTER — Other Ambulatory Visit (HOSPITAL_COMMUNITY): Payer: BLUE CROSS/BLUE SHIELD

## 2015-12-22 ENCOUNTER — Other Ambulatory Visit (HOSPITAL_COMMUNITY): Payer: BLUE CROSS/BLUE SHIELD

## 2015-12-22 LAB — URINE CULTURE

## 2015-12-25 ENCOUNTER — Other Ambulatory Visit (HOSPITAL_COMMUNITY): Payer: BLUE CROSS/BLUE SHIELD

## 2015-12-26 ENCOUNTER — Ambulatory Visit (HOSPITAL_COMMUNITY): Payer: Self-pay | Admitting: Psychiatry

## 2015-12-26 ENCOUNTER — Other Ambulatory Visit (HOSPITAL_COMMUNITY): Payer: BLUE CROSS/BLUE SHIELD

## 2015-12-31 ENCOUNTER — Other Ambulatory Visit: Payer: Self-pay

## 2016-01-03 ENCOUNTER — Ambulatory Visit (INDEPENDENT_AMBULATORY_CARE_PROVIDER_SITE_OTHER): Payer: BLUE CROSS/BLUE SHIELD | Admitting: Licensed Clinical Social Worker

## 2016-01-03 ENCOUNTER — Encounter (HOSPITAL_COMMUNITY): Payer: Self-pay | Admitting: Licensed Clinical Social Worker

## 2016-01-03 DIAGNOSIS — F3130 Bipolar disorder, current episode depressed, mild or moderate severity, unspecified: Secondary | ICD-10-CM | POA: Diagnosis not present

## 2016-01-03 NOTE — Progress Notes (Signed)
   THERAPIST PROGRESS NOTE  Session Time: 2:10-2:50pm  Participation Level: Active  Behavioral Response: CasualAlertEuthymic  Type of Therapy: Individual Therapy  Treatment Goals addressed: Coping  Interventions: Supportive  Summary: [name] is a 55 y.o. female. This is the first session with pt. She was referred by the IOP program. First session was spent getting to know the pt better and learning about her current status. Pt was forthcoming in explaining the details of her current life situation. Pt is receptive to beginning individual therapy.    Suicidal/Homicidal: Nowithout intent/plan  Therapist Response: Assessed pt's current functioning for baseline. Assisted patient processing for management of current stressors.  Plan: Return again in 2 weeks.  Diagnosis: Axis I: Bipolar, Depressed        Arrington Yohe S, LCAS-A 01/03/2016

## 2016-01-05 ENCOUNTER — Other Ambulatory Visit: Payer: Self-pay | Admitting: Neurology

## 2016-01-06 ENCOUNTER — Ambulatory Visit
Admission: RE | Admit: 2016-01-06 | Discharge: 2016-01-06 | Disposition: A | Discharge status: Home / Self Care | Payer: BLUE CROSS/BLUE SHIELD | Source: Ambulatory Visit | Attending: Neurology | Admitting: Neurology

## 2016-01-06 DIAGNOSIS — G43009 Migraine without aura, not intractable, without status migrainosus: Secondary | ICD-10-CM | POA: Diagnosis not present

## 2016-01-06 DIAGNOSIS — F439 Reaction to severe stress, unspecified: Secondary | ICD-10-CM

## 2016-01-06 MED ORDER — GADOBENATE DIMEGLUMINE 529 MG/ML IV SOLN
10.0000 mL | Freq: Once | INTRAVENOUS | Status: AC | PRN
  Administered 2016-01-06: 10 mL via INTRAVENOUS

## 2016-01-09 ENCOUNTER — Telehealth: Payer: Self-pay

## 2016-01-09 NOTE — Telephone Encounter (Signed)
I spoke to patient. She is aware of results and was reminded of next appt.

## 2016-01-09 NOTE — Telephone Encounter (Signed)
-----   Message from Huston FoleySaima Athar, MD sent at 01/09/2016  8:37 AM EDT ----- Please call and advise the patient that the recent scans we did was within normal limits. We did a brain MRI with and wo contrast, which showed normal findings. In particular, there were no acute findings, such as a stroke, or mass or blood products and no abn contrast uptake. No further action is required on this test at this time. Please remind patient to keep any upcoming appointments or tests and to call us with any interim questions, concerns, problems or updates. Thanks,  Huston FoleySaima Athar, MD, PhD

## 2016-01-09 NOTE — Progress Notes (Signed)
Please call and advise the patient that the recent scans we did was within normal limits. We did a brain MRI with and wo contrast, which showed normal findings. In particular, there were no acute findings, such as a stroke, or mass or blood products and no abn contrast uptake. No further action is required on this test at this time. Please remind patient to keep any upcoming appointments or tests and to call us with any interim questions, concerns, problems or updates. Thanks,  Huston FoleySaima Girl Schissler, MD, PhD

## 2016-01-12 DIAGNOSIS — H6011 Cellulitis of right external ear: Secondary | ICD-10-CM | POA: Diagnosis not present

## 2016-01-12 DIAGNOSIS — F3110 Bipolar disorder, current episode manic without psychotic features, unspecified: Secondary | ICD-10-CM | POA: Diagnosis not present

## 2016-01-12 DIAGNOSIS — F310 Bipolar disorder, current episode hypomanic: Secondary | ICD-10-CM | POA: Diagnosis not present

## 2016-01-12 DIAGNOSIS — K5900 Constipation, unspecified: Secondary | ICD-10-CM | POA: Diagnosis not present

## 2016-01-22 ENCOUNTER — Other Ambulatory Visit (HOSPITAL_COMMUNITY): Payer: Self-pay | Admitting: Medical

## 2016-01-24 ENCOUNTER — Ambulatory Visit (INDEPENDENT_AMBULATORY_CARE_PROVIDER_SITE_OTHER): Payer: BLUE CROSS/BLUE SHIELD | Admitting: Licensed Clinical Social Worker

## 2016-01-24 DIAGNOSIS — F4312 Post-traumatic stress disorder, chronic: Secondary | ICD-10-CM | POA: Diagnosis not present

## 2016-01-24 DIAGNOSIS — F3130 Bipolar disorder, current episode depressed, mild or moderate severity, unspecified: Secondary | ICD-10-CM

## 2016-01-25 ENCOUNTER — Ambulatory Visit (INDEPENDENT_AMBULATORY_CARE_PROVIDER_SITE_OTHER): Payer: BLUE CROSS/BLUE SHIELD | Admitting: Psychiatry

## 2016-01-25 ENCOUNTER — Encounter (HOSPITAL_COMMUNITY): Payer: Self-pay | Admitting: Psychiatry

## 2016-01-25 ENCOUNTER — Encounter (HOSPITAL_COMMUNITY): Payer: Self-pay | Admitting: Licensed Clinical Social Worker

## 2016-01-25 VITALS — BP 118/70 | HR 98 | Ht 64.0 in | Wt 106.4 lb

## 2016-01-25 DIAGNOSIS — F331 Major depressive disorder, recurrent, moderate: Secondary | ICD-10-CM

## 2016-01-25 DIAGNOSIS — F4310 Post-traumatic stress disorder, unspecified: Secondary | ICD-10-CM | POA: Diagnosis not present

## 2016-01-25 MED ORDER — ARIPIPRAZOLE 5 MG PO TABS: 5.0000 mg | ORAL_TABLET | Freq: Every day | ORAL | 2 refills | Status: DC
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
## 2016-02-07 ENCOUNTER — Ambulatory Visit (INDEPENDENT_AMBULATORY_CARE_PROVIDER_SITE_OTHER): Payer: BLUE CROSS/BLUE SHIELD | Admitting: Licensed Clinical Social Worker

## 2016-02-07 DIAGNOSIS — F331 Major depressive disorder, recurrent, moderate: Secondary | ICD-10-CM

## 2016-02-12 ENCOUNTER — Encounter (HOSPITAL_COMMUNITY): Payer: Self-pay | Admitting: Licensed Clinical Social Worker

## 2016-02-12 ENCOUNTER — Telehealth: Payer: Self-pay | Admitting: Neurology
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
## 2016-02-12 NOTE — Telephone Encounter (Signed)
Patient reports new of feet cramping, left leg goes numb that started about 2 mths ago when she fell.  The f/u appt for HA's was c/a, it needed to be r/s per the provider so at this time there is not an appt scheduled. She is asking for a sooner appt.  Advised that the nurse would call

## 2016-02-12 NOTE — Telephone Encounter (Signed)
I called patient back and advised her that I will keep an eye out for cancellations and as soon as one comes open, I will call her.

## 2016-02-14 ENCOUNTER — Ambulatory Visit (INDEPENDENT_AMBULATORY_CARE_PROVIDER_SITE_OTHER): Payer: BLUE CROSS/BLUE SHIELD | Admitting: Licensed Clinical Social Worker

## 2016-02-14 ENCOUNTER — Telehealth: Payer: Self-pay

## 2016-02-14 ENCOUNTER — Ambulatory Visit: Payer: Self-pay | Admitting: Neurology

## 2016-02-14 DIAGNOSIS — F331 Major depressive disorder, recurrent, moderate: Secondary | ICD-10-CM

## 2016-02-14 NOTE — Telephone Encounter (Signed)
Patient did not show to appt today  

## 2016-02-15 ENCOUNTER — Encounter (HOSPITAL_COMMUNITY): Payer: Self-pay | Admitting: Licensed Clinical Social Worker

## 2016-02-15 NOTE — Progress Notes (Signed)
THERAPIST PROGRESS NOTE  Session Time: 2:10-3:00pm  Participation Level: Active  Behavioral Response: CasualAlert/Depressed  Type of Therapy: Individual Therapy  Treatment Goals addressed: Coping  Interventions: Supportive  Summary: [name] is a 55 y.o. Female.Pt reports her depression is less today compared to last week.  Reviewed meditations with pt. Pt was assigned home work: writing in her journal but failed to complete the assignment. She reports she will complete the assignment by next session.Today's session focused on her journal. She wrote about her childhood beginning around age 89. Tearfully she read her journal about her mother's attempt at suicide, her father leaving the family, living with her grandparents. Processed with pt how all of these childhood events have affected her. Pt was willing to process the events. Still suggested pt use meditation for anxiety and depression.and self-esteem. Pt was receptive to suggestions. Encouraged pt to continue to write in her journal and use the meditations.    Suicidal/Homicidal: Nowithout intent/plan  Therapist Response: Assessed pt's current functioning for baseline. Assisted patient processing for management of current stressors.  Plan: Return again in 2 weeks using CBT  Diagnosis: Axis I: MDD recurrent episode, moderate        Kerrigan Glendening S, LCAS 01/24/16

## 2016-02-20 ENCOUNTER — Encounter: Payer: Self-pay | Admitting: Neurology

## 2016-02-21 ENCOUNTER — Encounter (HOSPITAL_COMMUNITY): Payer: Self-pay | Admitting: Licensed Clinical Social Worker

## 2016-02-21 ENCOUNTER — Ambulatory Visit (INDEPENDENT_AMBULATORY_CARE_PROVIDER_SITE_OTHER): Payer: BLUE CROSS/BLUE SHIELD | Admitting: Licensed Clinical Social Worker

## 2016-02-21 DIAGNOSIS — F331 Major depressive disorder, recurrent, moderate: Secondary | ICD-10-CM | POA: Diagnosis not present

## 2016-02-21 NOTE — Progress Notes (Signed)
THERAPIST PROGRESS NOTE  Session Time: 2:40-3:10pm  Participation Level: Active  Behavioral Response: CasualAlert/Depressed  Type of Therapy: Individual Therapy  Treatment Goals addressed: Coping  Interventions: Supportive  Summary: [name] is a 55 y.o. Female.Pt reports her depression is less today compared to last week.  Reviewed meditations with pt. Pt was assigned home work: writing in her journal but failed to complete the assignment. Processed this with pt. As to why she is not writing in her journal. "Its so much to write." Pt has previously written 1 exerpt and that is all.. Pt was encouraged to continue with her journaling. Pt is not willing to try meditation, "I don't have time." WIll continue to encourage the benefits of meditation for her depression, anxiety.and self-esteem. Pt was receptive to suggestions.PT was prescribed abilify and feels it is working well. She feels a lot different and better. First time any medication has worked. Pt Encouraged pt to continue to write in her journal and use meditation.    Suicidal/Homicidal: Nowithout intent/plan  Therapist Response: Assessed pt's current functioning and assisted patient processing for management of current stressors.  Plan: Return again in 1 week using CBT  Diagnosis: Axis I: MDD recurrent episode, moderate        [name],[name], LCAS 01/24/16

## 2016-02-28 ENCOUNTER — Telehealth (HOSPITAL_COMMUNITY): Payer: Self-pay

## 2016-02-28 ENCOUNTER — Ambulatory Visit (INDEPENDENT_AMBULATORY_CARE_PROVIDER_SITE_OTHER): Payer: BLUE CROSS/BLUE SHIELD | Admitting: Licensed Clinical Social Worker

## 2016-02-28 DIAGNOSIS — F3162 Bipolar disorder, current episode mixed, moderate: Secondary | ICD-10-CM

## 2016-02-28 DIAGNOSIS — F331 Major depressive disorder, recurrent, moderate: Secondary | ICD-10-CM | POA: Diagnosis not present

## 2016-02-28 NOTE — Telephone Encounter (Signed)
Patient came in to see Beth today, patient seems to be very manic. She came to see me about her Vyvanse that Dr. Parke SimmersBland prescribes. I was able to get her co-pay down so she could afford it, but she has not taken it in 4 days. Patient drove to [place] this morning for a tattoo, bought a car and dyed her hair purple, pink, and blue. Patient is definitely exhibiting signs of mania. She is currently on 5 mg Abilify prescribed by you, I feel she would benefit from a phone call or maybe bring her in today?? Please review and advise, thank you

## 2016-02-29 ENCOUNTER — Telehealth: Payer: Self-pay | Admitting: Women's Health

## 2016-02-29 ENCOUNTER — Encounter (HOSPITAL_COMMUNITY): Payer: Self-pay | Admitting: Licensed Clinical Social Worker

## 2016-02-29 ENCOUNTER — Telehealth: Payer: Self-pay | Admitting: *Deleted

## 2016-02-29 ENCOUNTER — Telehealth (HOSPITAL_COMMUNITY): Payer: Self-pay | Admitting: Licensed Clinical Social Worker

## 2016-02-29 MED ORDER — METRONIDAZOLE 0.75 % VA GEL: 1.0000 | Freq: Every day | VAGINAL | 0 refills | Status: DC

## 2016-02-29 NOTE — Telephone Encounter (Signed)
Pt states she was not in a good mood yesterday due to her daughter. Pt felt restricted from sharing information during her therapy session due to her daughter's presence. She is not feeling manic.States she was told by her therapist to do something for herself so the pt did.  She dyed her hair to represent cancer support- the hair dye is temporary. She bought a reasonably priced car. She also got a little tatoo to support her sister who has cancer. Mood is good. Energy is low. She is sleeping 7 hrs/night. Denies SI/HI/AVH.   Pt has not had her ADHD meds in 5 days due to cost. She will pick it up today.

## 2016-02-29 NOTE — Telephone Encounter (Signed)
Pt called w/ same sx as when she had BV last time, requesting metrogel, rx'd to her pharmacy.  Cheral MarkerKimberly R. Kinzee Happel, CNM, [place] [facility] 02/29/2016 1:37 PM

## 2016-02-29 NOTE — Telephone Encounter (Signed)
Pt states that she would like for Kim to call her in another refill of the medication she gave her for the last time for the bacterial infection. Pt states she know its not from Sex because she hasn't had any. Please contact pt

## 2016-02-29 NOTE — Telephone Encounter (Signed)
Patient has called stating she is having the same symptoms for her last infection. [place] and now has an odor and some urinary frequency. Pt states she would like for you to call her in another refill of the medication she gave her for the last time for the bacterial infection. Pt states she knows its not from sex because she hasn't had any. Please advise.

## 2016-02-29 NOTE — Progress Notes (Signed)
THERAPIST PROGRESS NOTE  Session Time: 1:10-2pm  Participation Level: Active  Behavioral Response: CasualAlert/Depressed  Type of Therapy: Individual Therapy  Treatment Goals addressed: Coping  Interventions: Supportive  Summary: Christine CarawayLesa W Douglas is a 55 y.o who presents with her daughter. Her daughter is concerned about her mom. Pt was manic having bought a car, gotten a tatoo and had her hair dyed purple, blue all within 2 days. Pt has not taken her Vyvanse for 5 days due to constrictions with her insurance. Will take pt by the CNA's office to have her look into the insurance problems. Pt reports due to the pt's daughter being with her today she did not bring in her journal. Pt was encouraged to continue with her journaling and to bring it next appt. Processed with pt and her daughter the symptoms of mania. Pt will begin to take her Vyvanse which should also help with mood stabilization. Pt was receptive to intervention.  Suicidal/Homicidal: Nowithout intent/plan  Therapist Response: Assessed pt's current functioning and assisted patient processing for management of current stressors.  Plan: Return again in 1 week using CBT  Diagnosis: Axis I: MDD recurrent episode, moderate Bipolar1 Disorder       Keoni Risinger S, LCAS 02/29/16

## 2016-02-29 NOTE — Telephone Encounter (Signed)
Left message on pt voicemail to let her know prescription was sent to pharmacy.

## 2016-03-01 ENCOUNTER — Telehealth: Payer: Self-pay | Admitting: *Deleted

## 2016-03-01 NOTE — Telephone Encounter (Signed)
Left message x 1. JSY 

## 2016-03-04 NOTE — Telephone Encounter (Signed)
Spoke with pt. Pt thinks she has a UTI. I advised she would need to be seen. Pt voiced understanding and call was transferred to front desk for appt. JSY

## 2016-03-05 ENCOUNTER — Ambulatory Visit: Payer: BLUE CROSS/BLUE SHIELD | Admitting: Adult Health

## 2016-03-06 ENCOUNTER — Ambulatory Visit (INDEPENDENT_AMBULATORY_CARE_PROVIDER_SITE_OTHER): Payer: BLUE CROSS/BLUE SHIELD | Admitting: Licensed Clinical Social Worker

## 2016-03-06 ENCOUNTER — Telehealth (HOSPITAL_COMMUNITY): Payer: Self-pay

## 2016-03-06 DIAGNOSIS — F3162 Bipolar disorder, current episode mixed, moderate: Secondary | ICD-10-CM

## 2016-03-06 DIAGNOSIS — F331 Major depressive disorder, recurrent, moderate: Secondary | ICD-10-CM | POA: Diagnosis not present

## 2016-03-06 NOTE — Progress Notes (Signed)
THERAPIST PROGRESS NOTE  Session Time: 1:10-2pm  Participation Level: Active  Behavioral Response: CasualAlert/Depressed  Type of Therapy: Individual Therapy  Treatment Goals addressed: Coping  Interventions: Supportive  Summary: Christine CarawayLesa W Douglas is a 55 y.o who presents witih bright blue hair today. She reports that Dr. Kenna GilbertArgarwal called her after her last therapy appt. Due to her manic behavior. Pt was able to get her Vyvanse refilled and her mood seems to be more stabilized. Pt was concerned after last therapy appt due to her daughter coming in. Assured pt that the last appt was to give daughter general information about her dx and therapy. Spent a lot of time working on boundaries with her 2 daughters. She finds this hard to do but was willing to participate in role plays using boundaries. Pt is trying to modiify her life to being more healthy: working on relationship with mother, working again, taking more control of her life, trying to use boundaries with her daughter, new Facebook page. Pt was encouraged to continue to become more healthy by continuing her wellness plan. Pt was receptive to intervention and suggestions.    Suicidal/Homicidal: Nowithout intent/plan  Therapist Response: Assessed pt's current functioning and assisted patient processing for management of current stressors.  Plan: Return again in 1 week using CBT  Diagnosis: Axis I: MDD recurrent episode, moderate Bipolar1 Disorder       Maryl Blalock S, LCAS 02/29/16

## 2016-03-06 NOTE — Telephone Encounter (Signed)
Patient stopped by my office on [facility] way [facility] she said that she is supposed to go up on the Abilify after one month. I told patient I would check with you and call her tomorrow. Please review and advise, thank you

## 2016-03-07 DIAGNOSIS — N3090 Cystitis, unspecified without hematuria: Secondary | ICD-10-CM | POA: Diagnosis not present

## 2016-03-07 DIAGNOSIS — R300 Dysuria: Secondary | ICD-10-CM | POA: Diagnosis not present

## 2016-03-07 NOTE — Telephone Encounter (Signed)
No we did not discuss that

## 2016-03-07 NOTE — Telephone Encounter (Signed)
Called patient and left voicemail letting her know she does not need to increase the Abilify a this time

## 2016-03-11 ENCOUNTER — Encounter (HOSPITAL_COMMUNITY): Payer: Self-pay | Admitting: Licensed Clinical Social Worker

## 2016-03-13 ENCOUNTER — Telehealth (HOSPITAL_COMMUNITY): Payer: Self-pay

## 2016-03-13 ENCOUNTER — Ambulatory Visit (INDEPENDENT_AMBULATORY_CARE_PROVIDER_SITE_OTHER): Payer: BLUE CROSS/BLUE SHIELD | Admitting: Licensed Clinical Social Worker

## 2016-03-13 DIAGNOSIS — F331 Major depressive disorder, recurrent, moderate: Secondary | ICD-10-CM | POA: Diagnosis not present

## 2016-03-13 DIAGNOSIS — F3162 Bipolar disorder, current episode mixed, moderate: Secondary | ICD-10-CM

## 2016-03-13 NOTE — Progress Notes (Signed)
THERAPIST PROGRESS NOTE  Session Time: 1:10-2pm  Participation Level: Active  Behavioral Response: CasualAlert/Depressed  Type of Therapy: Individual Therapy  Treatment Goals addressed: Coping  Interventions: Supportive  Summary: [name] is a 55 y.o who presents witih  Less blue hair today. She reports that she has not bought any more cars or gotten any more tatoos. Pt was concerned about her need for her therapist to be a [place]. Processed this with the pt. Pt brought in her journal and read from it about her life from [age], living with her grandparents and missing her father. Processed her relationship with her father with pt. Pt is still working on self-care which she has not been good at in the past. She wants to take care of everyone else. Processed with pt her self care.  Pt was encouraged to continue to become more healthy by continuing her wellness plan. Pt was receptive to intervention and suggestions.    Suicidal/Homicidal: Nowithout intent/plan  Therapist Response: Assessed pt's current functioning and assisted patient processing for management of current stressors.  Plan: Return again in 1 week using CBT  Diagnosis: Axis I: MDD recurrent episode, moderate Bipolar1 Disorder       Anthem Frazer S, LCAS 03/13/16

## 2016-03-13 NOTE — Telephone Encounter (Signed)
Patient states that she has started working again and takes her Vyvanse at about 5 am - she said that it is wearing off by the time she gets home, but she has elderly parents that she cares for and needs to do another 3-4 hours of work. She wants to know if you can increase dose or give her a small amount in the afternoon. Please review and advise, thank you

## 2016-03-13 NOTE — Telephone Encounter (Signed)
I have not seen this patient, but I believe she is a patient of Dr. Michae KavaAgarwal. I will forward this message to her, fyi.

## 2016-03-14 ENCOUNTER — Encounter (HOSPITAL_COMMUNITY): Payer: Self-pay | Admitting: Licensed Clinical Social Worker

## 2016-03-14 ENCOUNTER — Other Ambulatory Visit (HOSPITAL_COMMUNITY): Payer: Self-pay

## 2016-03-14 NOTE — Telephone Encounter (Signed)
Vyvanse is prescribed by her PCP. She will have to ask them

## 2016-03-20 ENCOUNTER — Ambulatory Visit: Payer: BLUE CROSS/BLUE SHIELD | Admitting: Neurology

## 2016-03-20 ENCOUNTER — Ambulatory Visit (INDEPENDENT_AMBULATORY_CARE_PROVIDER_SITE_OTHER): Payer: BLUE CROSS/BLUE SHIELD | Admitting: Licensed Clinical Social Worker

## 2016-03-20 DIAGNOSIS — F3162 Bipolar disorder, current episode mixed, moderate: Secondary | ICD-10-CM

## 2016-03-20 DIAGNOSIS — F331 Major depressive disorder, recurrent, moderate: Secondary | ICD-10-CM

## 2016-03-21 ENCOUNTER — Telehealth (HOSPITAL_COMMUNITY): Payer: Self-pay

## 2016-03-21 ENCOUNTER — Encounter (HOSPITAL_COMMUNITY): Payer: Self-pay | Admitting: Licensed Clinical Social Worker

## 2016-03-21 NOTE — Progress Notes (Signed)
THERAPIST PROGRESS NOTE  Session Time: 2:10-3pm  Participation Level: Active  Behavioral Response: CasualAlert/Depressed  Type of Therapy: Individual Therapy  Treatment Goals addressed: Coping  Interventions: Supportive  Summary: [name] is a 55 y.o who presents witih  Less blue hair today with a severe headache. Processed the symptoms of her headache and will take hr to see CNA Ragen at the end of the session. Pt reports she has had the headache for about a week, which has made her more irritable, especialy when dealing with her parents.. Pt is still working on self-care which she has not been good at in the past. Processed again with pt using her boundaries with her 2 daughters. Both daughters are going to move into her empty house. They will pay rent and have a lease agreement. She is trying to stop enabling her children with $ and continuously taking care of them. She wants to take care of everyone. Processed with pt her self care.  Pt was encouraged to continue to become more healthy by continuing her wellness plan. She currently exercises 3-4 days per week and does yoga daily.Practiced with pt her breathing exercises which she reports helps her when her parents and or her children upset her. Pt was receptive to intervention and suggestions.    Suicidal/Homicidal: Nowithout intent/plan  Therapist Response: Assessed pt's current functioning and assisted patient processing for management of current stressors.  Plan: Return again in 1 week using CBT  Diagnosis: Axis I: MDD recurrent episode, moderate Bipolar1 Disorder       Kandie Keiper S, LCAS 03/20/16

## 2016-03-21 NOTE — Telephone Encounter (Signed)
Need to discuss at next appt

## 2016-03-21 NOTE — Telephone Encounter (Signed)
Patient stopped by my office and I was talking to her about the Vyvanse prescription and she said that it was not the Vyvanse she wanted to increase, but the Abilify. She is currently on 5 mg po am - she would like to take another 5 mg in the afternoon. She states that when she gets home from work she still has to care for her parents and the Abilify from the morning has worn off and she finds herself tired and irritable. She feels that increasing the dose will help. Please review and advise, thank you

## 2016-03-26 DIAGNOSIS — F4312 Post-traumatic stress disorder, chronic: Secondary | ICD-10-CM | POA: Diagnosis not present

## 2016-03-26 DIAGNOSIS — F900 Attention-deficit hyperactivity disorder, predominantly inattentive type: Secondary | ICD-10-CM | POA: Diagnosis not present

## 2016-03-26 DIAGNOSIS — R310 Gross hematuria: Secondary | ICD-10-CM | POA: Diagnosis not present

## 2016-03-26 DIAGNOSIS — G43009 Migraine without aura, not intractable, without status migrainosus: Secondary | ICD-10-CM | POA: Diagnosis not present

## 2016-03-27 ENCOUNTER — Ambulatory Visit (INDEPENDENT_AMBULATORY_CARE_PROVIDER_SITE_OTHER): Payer: BLUE CROSS/BLUE SHIELD | Admitting: Licensed Clinical Social Worker

## 2016-03-27 ENCOUNTER — Encounter (HOSPITAL_COMMUNITY): Payer: Self-pay | Admitting: Licensed Clinical Social Worker

## 2016-03-27 DIAGNOSIS — F3162 Bipolar disorder, current episode mixed, moderate: Secondary | ICD-10-CM

## 2016-03-27 DIAGNOSIS — F4310 Post-traumatic stress disorder, unspecified: Secondary | ICD-10-CM | POA: Diagnosis not present
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
## 2016-04-03 ENCOUNTER — Ambulatory Visit (INDEPENDENT_AMBULATORY_CARE_PROVIDER_SITE_OTHER): Payer: BLUE CROSS/BLUE SHIELD | Admitting: Licensed Clinical Social Worker

## 2016-04-03 DIAGNOSIS — F3130 Bipolar disorder, current episode depressed, mild or moderate severity, unspecified: Secondary | ICD-10-CM

## 2016-04-03 DIAGNOSIS — Z1231 Encounter for screening mammogram for malignant neoplasm of breast: Secondary | ICD-10-CM | POA: Diagnosis not present

## 2016-04-03 NOTE — Progress Notes (Signed)
THERAPIST PROGRESS NOTE  Session Time: 2:10-3pm  Participation Level: Active  Behavioral Response: CasualAlert/Depressed  Type of Therapy: Individual Therapy  Treatment Goals addressed: Coping  Interventions: Supportive  Summary: [name] is a 55 y.o who presents stressed today.  She did not have a good thanksgiving because her daughter is mad with her.  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 She is confused and hurt. Processed with pt relationship circles and how people we allow in our lives may be close and intimate and then not close. Pt said, "This will be hard for me."" Pt is stressed due to care taking of her parents, working and focusing some on herself (Self-care). Pt is doing more for her mother and trying to understand her mother's illnesses more. Suggested to pt that she will have caretaker burnout if she continues on the path of caretaker and not taking care of herself.  Pt was encouraged to continue to become more healthy by continuing her wellness plan. Pt was receptive to intervention and suggestions.    Suicidal/Homicidal: Nowithout intent/plan  Therapist Response: Assessed pt's current functioning and assisted patient processing for management of current stressors.  Plan: Return again in 1 week using CBT  Diagnosis: Axis I: Bipolar Disorder, Moderate, Most recent episode depressed       Jiayi Lengacher S, LCAS 04/03/16

## 2016-04-04 ENCOUNTER — Encounter (HOSPITAL_COMMUNITY): Payer: Self-pay | Admitting: Licensed Clinical Social Worker

## 2016-04-10 ENCOUNTER — Ambulatory Visit (HOSPITAL_COMMUNITY): Payer: Self-pay | Admitting: Licensed Clinical Social Worker

## 2016-04-11 ENCOUNTER — Ambulatory Visit (HOSPITAL_COMMUNITY): Payer: BLUE CROSS/BLUE SHIELD | Admitting: Psychiatry

## 2016-04-14 ENCOUNTER — Other Ambulatory Visit (HOSPITAL_COMMUNITY): Payer: Self-pay | Admitting: Psychiatry

## 2016-04-14 DIAGNOSIS — F331 Major depressive disorder, recurrent, moderate: Secondary | ICD-10-CM

## 2016-04-14 DIAGNOSIS — F4310 Post-traumatic stress disorder, unspecified: Secondary | ICD-10-CM

## 2016-04-17 ENCOUNTER — Ambulatory Visit (INDEPENDENT_AMBULATORY_CARE_PROVIDER_SITE_OTHER): Payer: BLUE CROSS/BLUE SHIELD | Admitting: Licensed Clinical Social Worker

## 2016-04-17 ENCOUNTER — Encounter (HOSPITAL_COMMUNITY): Payer: Self-pay | Admitting: Licensed Clinical Social Worker

## 2016-04-17 DIAGNOSIS — F3130 Bipolar disorder, current episode depressed, mild or moderate severity, unspecified: Secondary | ICD-10-CM

## 2016-04-17 NOTE — Progress Notes (Signed)
THERAPIST PROGRESS NOTE  Session Time: 2:10-3pm  Participation Level: Active  Behavioral Response: CasualAlert/Depressed  Type of Therapy: Individual Therapy  Treatment Goals addressed: Coping  Interventions: Supportive  Summary: Christine Douglas is a 55 y.o who presents stressed today. Pt is exhausted trying to care for both of her parents, work a job, help with her 2 adult children who do not appreciate her. 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 She is confused and hurt. Processed with pt relationship circles and how people we allow in our lives may be close and intimate and then not close. Discussed at length whether she can handle continuing to take care of her parents full time. It is completely wearing her out. Role played with pt how to bring up the subject. Pt said, "This will be hard for me." Suggested to pt that she will have caretaker burnout if she continues on the path of caretaker and not taking care of herself.  Processed her self-care and encouraged pt to pay more attention to her own self-care.  Pt was receptive to intervention and suggestions.    Suicidal/Homicidal: Nowithout intent/plan  Therapist Response: Assessed pt's current functioning and assisted patient processing for management of current stressors.  Plan: Return again in 1 week using CBT  Diagnosis: Axis I: Bipolar Disorder, Moderate, Most recent episode depressed       Marion Seese S, LCAS 04/17/16

## 2016-04-18 ENCOUNTER — Other Ambulatory Visit (HOSPITAL_COMMUNITY): Payer: Self-pay

## 2016-04-18 DIAGNOSIS — F331 Major depressive disorder, recurrent, moderate: Secondary | ICD-10-CM

## 2016-04-18 DIAGNOSIS — F4310 Post-traumatic stress disorder, unspecified: Secondary | ICD-10-CM

## 2016-04-18 MED ORDER — ARIPIPRAZOLE 5 MG PO TABS: 5.0000 mg | ORAL_TABLET | Freq: Every day | ORAL | 0 refills | Status: DC

## 2016-04-24 ENCOUNTER — Ambulatory Visit (INDEPENDENT_AMBULATORY_CARE_PROVIDER_SITE_OTHER): Payer: BLUE CROSS/BLUE SHIELD | Admitting: Licensed Clinical Social Worker

## 2016-04-24 ENCOUNTER — Telehealth (HOSPITAL_COMMUNITY): Payer: Self-pay

## 2016-04-24 DIAGNOSIS — F3162 Bipolar disorder, current episode mixed, moderate: Secondary | ICD-10-CM | POA: Diagnosis not present

## 2016-04-24 DIAGNOSIS — F331 Major depressive disorder, recurrent, moderate: Secondary | ICD-10-CM

## 2016-04-24 NOTE — Telephone Encounter (Signed)
Patient came by and she states that Abilify is not working. She is not sleeping through the night, she is unable to lay down and go to sleep - her mind has been racing. She sees you next week , but does not want to wait that long.

## 2016-04-25 ENCOUNTER — Encounter (HOSPITAL_COMMUNITY): Payer: Self-pay | Admitting: Licensed Clinical Social Worker
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
## 2016-05-01 ENCOUNTER — Ambulatory Visit (HOSPITAL_COMMUNITY): Payer: Self-pay | Admitting: Licensed Clinical Social Worker
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
## 2016-05-02 ENCOUNTER — Ambulatory Visit (HOSPITAL_COMMUNITY): Payer: Self-pay | Admitting: Psychiatry

## 2016-05-08 ENCOUNTER — Ambulatory Visit (HOSPITAL_COMMUNITY): Payer: Self-pay | Admitting: Licensed Clinical Social Worker

## 2016-05-14 ENCOUNTER — Telehealth (HOSPITAL_COMMUNITY): Payer: Self-pay | Admitting: Licensed Clinical Social Worker

## 2016-05-15 ENCOUNTER — Encounter (HOSPITAL_COMMUNITY): Payer: Self-pay | Admitting: Licensed Clinical Social Worker

## 2016-05-15 ENCOUNTER — Ambulatory Visit (INDEPENDENT_AMBULATORY_CARE_PROVIDER_SITE_OTHER): Payer: BLUE CROSS/BLUE SHIELD | Admitting: Licensed Clinical Social Worker

## 2016-05-15 DIAGNOSIS — F3162 Bipolar disorder, current episode mixed, moderate: Secondary | ICD-10-CM | POA: Diagnosis not present

## 2016-05-15 NOTE — Progress Notes (Signed)
THERAPIST PROGRESS NOTE  Session Time: 2:10-3pm  Participation Level: Active  Behavioral Response: CasualAlert/Depressed  Type of Therapy: Individual Therapy  Treatment Goals addressed: Coping  Interventions: Supportive  Summary: Christine CarawayLesa W Douglas is a 56 y.o who presents stressed/Depressed and tearful today. Pt has missed the last 2 appts and has been MIA. Explained to pt that I was about to do a safety check. Discussed emergency contact and filled out a new ROI for her daughter.Nothing has changed with 1 daughter or her parents. She is exhausted trying to care for both of her parents, work a job, help with 1 adult child who do not appreciate her. Previously had discussed having parents placed in a facility but pt reports things are somewhat better. Again discussed at length whether she can handle continuing to take care of her parents full time. It is completely wearing her out.  Processed her self-care and encouraged pt to pay more attention to her own self-care. Pt reports she has joined a gym and is eating healthier Pt was receptive to intervention and suggestions.    Suicidal/Homicidal: Nowithout intent/plan  Therapist Response: Assessed pt'Douglas current functioning and assisted patient processing for management of current stressors.  Plan: Return again in 1 week using CBT  Diagnosis: Axis I: Bipolar Disorder, Moderate, Most recent episode depressed       Christine Douglas, LCAS 05/15/16

## 2016-05-17 ENCOUNTER — Other Ambulatory Visit (HOSPITAL_COMMUNITY): Payer: Self-pay | Admitting: Psychiatry

## 2016-05-17 DIAGNOSIS — F4310 Post-traumatic stress disorder, unspecified: Secondary | ICD-10-CM

## 2016-05-17 DIAGNOSIS — F331 Major depressive disorder, recurrent, moderate: Secondary | ICD-10-CM

## 2016-05-18 DIAGNOSIS — F900 Attention-deficit hyperactivity disorder, predominantly inattentive type: Secondary | ICD-10-CM | POA: Diagnosis not present

## 2016-05-18 DIAGNOSIS — F410 Panic disorder [episodic paroxysmal anxiety] without agoraphobia: Secondary | ICD-10-CM | POA: Diagnosis not present

## 2016-05-28 ENCOUNTER — Encounter (HOSPITAL_COMMUNITY): Payer: Self-pay | Admitting: Psychiatry

## 2016-05-28 ENCOUNTER — Ambulatory Visit (INDEPENDENT_AMBULATORY_CARE_PROVIDER_SITE_OTHER): Payer: BLUE CROSS/BLUE SHIELD | Admitting: Psychiatry

## 2016-05-28 VITALS — BP 104/68 | HR 96 | Ht 64.0 in | Wt 111.4 lb

## 2016-05-28 DIAGNOSIS — Z9889 Other specified postprocedural states: Secondary | ICD-10-CM | POA: Diagnosis not present

## 2016-05-28 DIAGNOSIS — Z87891 Personal history of nicotine dependence: Secondary | ICD-10-CM

## 2016-05-28 DIAGNOSIS — F900 Attention-deficit hyperactivity disorder, predominantly inattentive type: Secondary | ICD-10-CM | POA: Diagnosis not present

## 2016-05-28 DIAGNOSIS — F331 Major depressive disorder, recurrent, moderate: Secondary | ICD-10-CM | POA: Diagnosis not present

## 2016-05-28 DIAGNOSIS — Z818 Family history of other mental and behavioral disorders: Secondary | ICD-10-CM

## 2016-05-28 DIAGNOSIS — F4310 Post-traumatic stress disorder, unspecified: Secondary | ICD-10-CM | POA: Diagnosis not present

## 2016-05-28 DIAGNOSIS — Z79899 Other long term (current) drug therapy: Secondary | ICD-10-CM

## 2016-05-28 DIAGNOSIS — Z841 Family history of disorders of kidney and ureter: Secondary | ICD-10-CM

## 2016-05-28 DIAGNOSIS — Z888 Allergy status to other drugs, medicaments and biological substances status: Secondary | ICD-10-CM

## 2016-05-28 DIAGNOSIS — Z808 Family history of malignant neoplasm of other organs or systems: Secondary | ICD-10-CM

## 2016-05-28 MED ORDER — LITHIUM CARBONATE ER 450 MG PO TBCR: 450.0000 mg | EXTENDED_RELEASE_TABLET | Freq: Every day | ORAL | 2 refills | Status: DC
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
## 2016-05-29 ENCOUNTER — Ambulatory Visit (HOSPITAL_COMMUNITY): Payer: Self-pay | Admitting: Licensed Clinical Social Worker

## 2016-05-30 ENCOUNTER — Telehealth (HOSPITAL_COMMUNITY): Payer: Self-pay | Admitting: Licensed Clinical Social Worker

## 2016-05-30 NOTE — Telephone Encounter (Signed)
Called pt to give her the date of her next appt.  Christine RiegerLisbeth S. Breshae Belcher, LCAS

## 2016-06-05 ENCOUNTER — Ambulatory Visit (INDEPENDENT_AMBULATORY_CARE_PROVIDER_SITE_OTHER): Payer: BLUE CROSS/BLUE SHIELD | Admitting: Licensed Clinical Social Worker

## 2016-06-05 DIAGNOSIS — F331 Major depressive disorder, recurrent, moderate: Secondary | ICD-10-CM

## 2016-06-06 ENCOUNTER — Ambulatory Visit (INDEPENDENT_AMBULATORY_CARE_PROVIDER_SITE_OTHER): Payer: BLUE CROSS/BLUE SHIELD | Admitting: Psychiatry

## 2016-06-06 ENCOUNTER — Encounter (HOSPITAL_COMMUNITY): Payer: Self-pay | Admitting: Licensed Clinical Social Worker

## 2016-06-06 ENCOUNTER — Encounter (HOSPITAL_COMMUNITY): Payer: Self-pay | Admitting: Psychiatry

## 2016-06-06 VITALS — BP 118/62 | HR 102 | Ht 64.0 in | Wt 110.4 lb

## 2016-06-06 DIAGNOSIS — Z8349 Family history of other endocrine, nutritional and metabolic diseases: Secondary | ICD-10-CM | POA: Diagnosis not present

## 2016-06-06 DIAGNOSIS — Z818 Family history of other mental and behavioral disorders: Secondary | ICD-10-CM

## 2016-06-06 DIAGNOSIS — F4310 Post-traumatic stress disorder, unspecified: Secondary | ICD-10-CM

## 2016-06-06 DIAGNOSIS — Z87891 Personal history of nicotine dependence: Secondary | ICD-10-CM

## 2016-06-06 DIAGNOSIS — F900 Attention-deficit hyperactivity disorder, predominantly inattentive type: Secondary | ICD-10-CM

## 2016-06-06 DIAGNOSIS — Z79899 Other long term (current) drug therapy: Secondary | ICD-10-CM

## 2016-06-06 DIAGNOSIS — Z888 Allergy status to other drugs, medicaments and biological substances status: Secondary | ICD-10-CM

## 2016-06-06 DIAGNOSIS — F331 Major depressive disorder, recurrent, moderate: Secondary | ICD-10-CM | POA: Diagnosis not present

## 2016-06-06 DIAGNOSIS — Z808 Family history of malignant neoplasm of other organs or systems: Secondary | ICD-10-CM

## 2016-06-06 NOTE — Progress Notes (Signed)
THERAPIST PROGRESS NOTE  Session Time: 11:10-12pm  Participation Level: Active  Behavioral Response: CasualAlert/Depressed  Type of Therapy: Individual Therapy  Treatment Goals addressed: Coping  Interventions: Supportive  Summary: Christine CarawayLesa W Douglas is a 56 y.o who presents stressed/Depressed and tearful today. Her mother has been in the hospital and she has been there with her. After the hospital she goes home to care for her step-father.  She is still exhausted trying to care for both of her parents, work a job. She is using boundaries more with her parents, which has been helping with her own self care. Processed her self-care and encouraged pt to pay more attention to her own self-care. Pt reports she has joined a gym and is eating healthier. She is journaling and meditating. She reports all these coping tools help her to remain a caregiver for her parents, though exhausted.  Pt was receptive to intervention and suggestions.    Suicidal/Homicidal: Nowithout intent/plan  Therapist Response: Assessed pt's current functioning and assisted patient processing for management of current stressors.  Plan: Return again in 1 week using CBT  Diagnosis: Axis I: Moderate episode of recurrent major depressive disorder,    Braxton Weisbecker S, LCAS 06/05/16
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
## 2016-06-12 ENCOUNTER — Ambulatory Visit (INDEPENDENT_AMBULATORY_CARE_PROVIDER_SITE_OTHER): Payer: BLUE CROSS/BLUE SHIELD | Admitting: Licensed Clinical Social Worker

## 2016-06-12 DIAGNOSIS — F331 Major depressive disorder, recurrent, moderate: Secondary | ICD-10-CM

## 2016-06-12 NOTE — Progress Notes (Signed)
THERAPIST PROGRESS NOTE  Session Time: 2:10-3pm  Participation Level: Active  Behavioral Response: CasualAlert/Depressed  Type of Therapy: Individual Therapy  Treatment Goals addressed: Coping  Interventions: Supportive  Summary: Christine Douglas is a 56 y.o who presents stressed/Depressed and tearful today. Christine Douglas is back in ICU. She has C-Diff and is not doing well. Pts Douglas will be referred to Rehab facility after Christine hospital stay.  After  Pt leaves the hospital she goes home to care for Christine step-father.  She is still exhausted trying to care for both of Christine parents, work a job. Pt is still experiencing a strained relationship with Christine 2 daughters, 1 more than the other.  Processed Christine self-care and encouraged pt to pay more attention to Christine own self-care. Pt is taking care of everyone in Christine family and no one is taking care of Christine, including herself. Pt reports she is journaling and meditating. Pt took a day off yesterday and just cared for herself all day long. Congratulated pt on Christine success in self-care. She reports all these coping tools help Christine to remain a caregiver for Christine parents, though exhausted.  Pt was receptive to intervention and suggestions.    Suicidal/Homicidal: Nowithout intent/plan  Therapist Response: Assessed pt's current functioning and assisted patient processing for management of current stressors.  Plan: Return again in 1 week using CBT  Diagnosis: Axis I: Moderate episode of recurrent major depressive disorder,    [name],[name], LCAS 06/12/16

## 2016-06-13 ENCOUNTER — Encounter (HOSPITAL_COMMUNITY): Payer: Self-pay | Admitting: Licensed Clinical Social Worker

## 2016-06-14 ENCOUNTER — Other Ambulatory Visit: Payer: Self-pay | Admitting: Neurology

## 2016-06-14 DIAGNOSIS — G43009 Migraine without aura, not intractable, without status migrainosus: Secondary | ICD-10-CM

## 2016-06-14 DIAGNOSIS — F439 Reaction to severe stress, unspecified: Secondary | ICD-10-CM

## 2016-06-19 ENCOUNTER — Ambulatory Visit (HOSPITAL_COMMUNITY): Payer: Self-pay | Admitting: Licensed Clinical Social Worker

## 2016-06-26 ENCOUNTER — Ambulatory Visit (HOSPITAL_COMMUNITY): Payer: Self-pay | Admitting: Licensed Clinical Social Worker

## 2016-07-03 ENCOUNTER — Ambulatory Visit (HOSPITAL_COMMUNITY): Payer: Self-pay | Admitting: Licensed Clinical Social Worker

## 2016-07-09 ENCOUNTER — Ambulatory Visit (HOSPITAL_COMMUNITY): Payer: Self-pay | Admitting: Licensed Clinical Social Worker

## 2016-07-09 NOTE — Progress Notes (Deleted)
THERAPIST PROGRESS NOTE  Session Time: 2:10-3pm  Participation Level: Active  Behavioral Response: CasualAlert/Depressed  Type of Therapy: Individual Therapy  Treatment Goals addressed: Coping  Interventions: Supportive  Summary: [name] is a 56 y.o who presents stressed/Depressed and tearful today. Her mother is back in ICU. She has C-Diff and is not doing well. Pts mother will be referred to Rehab facility after her hospital stay.  After  Pt leaves the hospital she goes home to care for her step-father.  She is still exhausted trying to care for both of her parents, work a job. Pt is still experiencing a strained relationship with her 2 daughters, 1 more than the other.  Processed her self-care and encouraged pt to pay more attention to her own self-care. Pt is taking care of everyone in her family and no one is taking care of her, including herself. Pt reports she is journaling and meditating. Pt took a day off yesterday and just cared for herself all day long. Congratulated pt on her success in self-care. She reports all these coping tools help her to remain a caregiver for her parents, though exhausted.  Pt was receptive to intervention and suggestions.    Suicidal/Homicidal: Nowithout intent/plan  Therapist Response: Assessed pt's current functioning and assisted patient processing for management of current stressors.  Plan: Return again in 1 week using CBT  Diagnosis: Axis I: Moderate episode of recurrent major depressive disorder,    Jaimes Eckert S, LCAS 06/12/16

## 2016-07-10 ENCOUNTER — Ambulatory Visit (HOSPITAL_COMMUNITY): Payer: Self-pay | Admitting: Licensed Clinical Social Worker

## 2016-07-16 ENCOUNTER — Ambulatory Visit (INDEPENDENT_AMBULATORY_CARE_PROVIDER_SITE_OTHER): Payer: BLUE CROSS/BLUE SHIELD | Admitting: Licensed Clinical Social Worker

## 2016-07-16 DIAGNOSIS — F331 Major depressive disorder, recurrent, moderate: Secondary | ICD-10-CM

## 2016-07-16 NOTE — Progress Notes (Signed)
THERAPIST PROGRESS NOTE  Session Time: 2:10-3pm  Participation Level: Active  Behavioral Response: CasualAlert/Depressed  Type of Therapy: Individual Therapy  Treatment Goals addressed: Coping  Interventions: Supportive  Summary: Christine Douglas is a 56 y.o who presents less depressed today. She is beginning the process of grieving her stepfather and continuing to care for her mother. Pt reports its easier caring for just her mother. Processed with pt what does her future look like now. She has been caring for both parents for over a year, close to 2 years now Pt is still experiencing a strained relationship with her 2 daughters, but they have stepped up some to assist pt with the funeral of her stepfather. Pt reported she stopped taking her lithium and valium. She did not feel it was working and she feels much more stable. She reports she is using her coping skills to deal with her anxiety and depression rather than medications. Pt is trying to make some decisions for herself. She is even thinking about putting herself out there and maybe dating. She realizes she has put everyone else first and has let her own mental wellness and personal care wane.  Processed her self-care and encouraged pt to pay more attention to her own self-care. She reports all of her coping tools help her to remain a caregiver for her mother.  Pt was receptive to intervention and suggestions.    Suicidal/Homicidal: Nowithout intent/plan  Therapist Response: Assessed pt's current functioning and assisted patient processing for management of current stressors.  Plan: Return again in 1 week using CBT  Diagnosis: Axis I: Moderate episode of recurrent major depressive disorder,    Chong Wojdyla S, LCAS 07/16/16

## 2016-07-17 ENCOUNTER — Ambulatory Visit (HOSPITAL_COMMUNITY): Payer: Self-pay | Admitting: Licensed Clinical Social Worker

## 2016-07-18 ENCOUNTER — Encounter (HOSPITAL_COMMUNITY): Payer: Self-pay | Admitting: Licensed Clinical Social Worker

## 2016-07-23 ENCOUNTER — Ambulatory Visit (INDEPENDENT_AMBULATORY_CARE_PROVIDER_SITE_OTHER): Payer: BLUE CROSS/BLUE SHIELD | Admitting: Licensed Clinical Social Worker

## 2016-07-23 DIAGNOSIS — F900 Attention-deficit hyperactivity disorder, predominantly inattentive type: Secondary | ICD-10-CM | POA: Diagnosis not present

## 2016-07-23 DIAGNOSIS — F3130 Bipolar disorder, current episode depressed, mild or moderate severity, unspecified: Secondary | ICD-10-CM

## 2016-07-23 DIAGNOSIS — F331 Major depressive disorder, recurrent, moderate: Secondary | ICD-10-CM | POA: Diagnosis not present

## 2016-07-24 ENCOUNTER — Ambulatory Visit (HOSPITAL_COMMUNITY): Payer: Self-pay | Admitting: Licensed Clinical Social Worker

## 2016-07-24 ENCOUNTER — Encounter (HOSPITAL_COMMUNITY): Payer: Self-pay | Admitting: Licensed Clinical Social Worker

## 2016-07-24 NOTE — Progress Notes (Signed)
THERAPIST PROGRESS NOTE  Session Time: 2:10-3pm  Participation Level: Active  Behavioral Response: CasualAlert/Depressed  Type of Therapy: Individual Therapy  Treatment Goals addressed: Coping  Interventions: Supportive  Summary: Christine Douglas is a 56 y.o who presents more depressed today. She took herself off the Lithium and now she feels the effects of not being on medication. She is having mood swings and has more days of depression.  She is frustrated with caring for her mother. Processed with pt alternatives to caring for her mother in the home. Processed with pt again what does her future look like now.  Pt is trying to make some decisions for herself, but still has to keep in mind her mother's care.   Processed her self-care and encouraged pt to pay more attention to her own self-care. She reports all of her coping tools help her to remain a caregiver for her mother, but it's still a hard job. Pt has not been journaling, but has been going to the gym, eating healthier, sleeping more and meditating some. Took pt to speak with CMA Regan to discuss restarting her Lithium and to also take the DNA test for specific medications.  Pt was receptive to intervention and suggestions.    Suicidal/Homicidal: Nowithout intent/plan  Therapist Response: Assessed pt's current functioning and assisted patient processing for management of current stressors.  Plan: Return again in 1 week using CBT  Diagnosis: Axis I: Bipolar 1, most rec ent episode depressed [name],[name], LCAS 07/23/16

## 2016-07-30 ENCOUNTER — Ambulatory Visit (INDEPENDENT_AMBULATORY_CARE_PROVIDER_SITE_OTHER): Payer: BLUE CROSS/BLUE SHIELD | Admitting: Licensed Clinical Social Worker

## 2016-07-30 DIAGNOSIS — F3130 Bipolar disorder, current episode depressed, mild or moderate severity, unspecified: Secondary | ICD-10-CM

## 2016-07-31 ENCOUNTER — Ambulatory Visit (HOSPITAL_COMMUNITY): Payer: Self-pay | Admitting: Licensed Clinical Social Worker

## 2016-07-31 ENCOUNTER — Encounter (HOSPITAL_COMMUNITY): Payer: Self-pay | Admitting: Licensed Clinical Social Worker

## 2016-07-31 NOTE — Progress Notes (Signed)
THERAPIST PROGRESS NOTE  Session Time: 2:10-3pm  Participation Level: Active  Behavioral Response: CasualAlert/Depressed  Type of Therapy: Individual Therapy  Treatment Goals addressed: Coping  Interventions: Supportive  Summary: Christine CarawayLesa W Douglas is a 56 y.o who presents less depressed today. She restarted her the Lithium and now  feels less depressed and anxious.  She is less frustrated with caring for her mother. Her mother is less demanding with pt. Still encouraging pt to make a new plan her own life. Continue to process her self-care and encourage pt to pay more attention to her own self-care.  Pt received the results of her DNA test and CMA Regan went over the results with pt.Suggested to pt that she join the OP evening group beginning next week along with her individual sessions. Processed this with pt. She was receptive to suggestions.    Suicidal/Homicidal: Nowithout intent/plan  Therapist Response: Assessed pt's current functioning and assisted patient processing for management of current stressors.  Plan: Return again in 1 week using CBT  Diagnosis: Axis I: Bipolar 1, most rec ent episode depressed Jenette Rayson S, LCAS 07/30/16

## 2016-08-01 ENCOUNTER — Ambulatory Visit (HOSPITAL_COMMUNITY): Payer: Self-pay | Admitting: Psychiatry

## 2016-08-07 ENCOUNTER — Ambulatory Visit (INDEPENDENT_AMBULATORY_CARE_PROVIDER_SITE_OTHER): Payer: BLUE CROSS/BLUE SHIELD | Admitting: Licensed Clinical Social Worker

## 2016-08-07 DIAGNOSIS — F331 Major depressive disorder, recurrent, moderate: Secondary | ICD-10-CM

## 2016-08-08 ENCOUNTER — Ambulatory Visit (INDEPENDENT_AMBULATORY_CARE_PROVIDER_SITE_OTHER): Payer: BLUE CROSS/BLUE SHIELD | Admitting: Psychiatry

## 2016-08-08 ENCOUNTER — Encounter (HOSPITAL_COMMUNITY): Payer: Self-pay | Admitting: Psychiatry

## 2016-08-08 VITALS — BP 104/68 | HR 82 | Ht 64.0 in | Wt 111.6 lb

## 2016-08-08 DIAGNOSIS — F332 Major depressive disorder, recurrent severe without psychotic features: Secondary | ICD-10-CM

## 2016-08-08 DIAGNOSIS — F902 Attention-deficit hyperactivity disorder, combined type: Secondary | ICD-10-CM

## 2016-08-08 DIAGNOSIS — Z818 Family history of other mental and behavioral disorders: Secondary | ICD-10-CM

## 2016-08-08 DIAGNOSIS — F4310 Post-traumatic stress disorder, unspecified: Secondary | ICD-10-CM

## 2016-08-08 DIAGNOSIS — Z79899 Other long term (current) drug therapy: Secondary | ICD-10-CM | POA: Diagnosis not present

## 2016-08-08 DIAGNOSIS — Z87891 Personal history of nicotine dependence: Secondary | ICD-10-CM | POA: Diagnosis not present

## 2016-08-08 MED ORDER — DESVENLAFAXINE SUCCINATE ER 50 MG PO TB24: 50.0000 mg | ORAL_TABLET | Freq: Every day | ORAL | 1 refills | Status: DC
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
## 2016-08-10 DIAGNOSIS — G4489 Other headache syndrome: Secondary | ICD-10-CM | POA: Diagnosis not present

## 2016-08-10 DIAGNOSIS — S098XXA Other specified injuries of head, initial encounter: Secondary | ICD-10-CM | POA: Diagnosis not present

## 2016-08-12 ENCOUNTER — Encounter (HOSPITAL_COMMUNITY): Payer: Self-pay | Admitting: Licensed Clinical Social Worker
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
## 2016-08-13 ENCOUNTER — Ambulatory Visit (INDEPENDENT_AMBULATORY_CARE_PROVIDER_SITE_OTHER): Payer: BLUE CROSS/BLUE SHIELD | Admitting: Licensed Clinical Social Worker

## 2016-08-13 DIAGNOSIS — F332 Major depressive disorder, recurrent severe without psychotic features: Secondary | ICD-10-CM | POA: Diagnosis not present

## 2016-08-14 ENCOUNTER — Ambulatory Visit (HOSPITAL_COMMUNITY): Payer: Self-pay | Admitting: Licensed Clinical Social Worker

## 2016-08-19 ENCOUNTER — Encounter (HOSPITAL_COMMUNITY): Payer: Self-pay | Admitting: Licensed Clinical Social Worker

## 2016-08-19 NOTE — Progress Notes (Signed)
Daily Group Progress Note Program:  Outpatient   Group Time: 5:30-6:30pm  Participation Level: Active  Behavioral Response: Appropriate  Type of Therapy:  Psychoeducation/Therapy  Summary of Progress: Pt participated in an introductory group. Today is the first day of the outpatient evening group. Pt articulated expectations of the group, what she needs to feel safe in the group and coping skills for mental wellness in recovery. She also gave some background information so the group can become more acquainted with patient.    Christine Douglas S. Christine Douglas, LCAS  

## 2016-08-20 ENCOUNTER — Ambulatory Visit (HOSPITAL_COMMUNITY): Payer: Self-pay | Admitting: Licensed Clinical Social Worker

## 2016-08-21 ENCOUNTER — Ambulatory Visit (INDEPENDENT_AMBULATORY_CARE_PROVIDER_SITE_OTHER): Payer: BLUE CROSS/BLUE SHIELD | Admitting: Licensed Clinical Social Worker

## 2016-08-21 DIAGNOSIS — F331 Major depressive disorder, recurrent, moderate: Secondary | ICD-10-CM

## 2016-08-27 ENCOUNTER — Ambulatory Visit (INDEPENDENT_AMBULATORY_CARE_PROVIDER_SITE_OTHER): Payer: BLUE CROSS/BLUE SHIELD | Admitting: Licensed Clinical Social Worker

## 2016-08-27 DIAGNOSIS — F4310 Post-traumatic stress disorder, unspecified: Secondary | ICD-10-CM

## 2016-08-27 DIAGNOSIS — F331 Major depressive disorder, recurrent, moderate: Secondary | ICD-10-CM

## 2016-08-28 ENCOUNTER — Ambulatory Visit (HOSPITAL_COMMUNITY): Payer: Self-pay | Admitting: Licensed Clinical Social Worker

## 2016-08-28 ENCOUNTER — Encounter (HOSPITAL_COMMUNITY): Payer: Self-pay | Admitting: Licensed Clinical Social Worker

## 2016-08-28 NOTE — Progress Notes (Signed)
THERAPIST PROGRESS NOTE  Session Time: 2:10-3pm  Participation Level: Active  Behavioral Response: CasualAlert/Depressed  Type of Therapy: Individual Therapy  Treatment Goals addressed: Coping  Interventions: Supportive  Summary: Christine Douglas is a 56 y.o who presents tearful today. Pt received some bad news about her oldest daughter who has been dx with cancer. Pts daughter has not been to an oncologist so does not know full results of the dx. Processed this at length with pt. Pt saw her psychiatrist who prescribed Prestique which pht feels is working Douglas. She has no side effects, it helps her sleeping patterns and she feels "good." The caretaking of her mother seems to be better and less stressful. Worked with pt on "sitting with the pain", being in the moment and mindfulness. Pt was engaged in the intervention.      Suicidal/Homicidal: Nowithout intent/plan  Therapist Response: Assessed pt's current functioning and assisted patient processing for management of current stressors.  Plan: Return again in 1 week using CBT  Diagnosis: Axis I: Major Depressive Disorder, recurrent  Episode, moderate Sheril Hammond S, LCAS 08/21/16

## 2016-08-29 ENCOUNTER — Encounter (HOSPITAL_COMMUNITY): Payer: Self-pay | Admitting: Licensed Clinical Social Worker

## 2016-08-29 NOTE — Progress Notes (Signed)
Daily Group Progress Note Program:  OP Evening Group   Group Time: 5:30-6:30  Participation Level: Active  Behavioral Response: Appropriate  Type of Therapy:  Psychoeducation/Therapy  Summary of Progress: Pt participated in a discussion on effective coping skills for her anxiety and depression. Pt shared with the group the coping skills that work for her include: breathing and meditation. She has been encouraged to journal. These seemed to be helpful suggestions to other members of the group. Pt was encouraged to continue using her coping skills that she has found effective and to try new ones she learned today. Pt was receptive to suggestions and intervention.  Vernona Rieger, LCAS

## 2016-09-03 ENCOUNTER — Encounter (HOSPITAL_COMMUNITY): Payer: Self-pay | Admitting: Licensed Clinical Social Worker

## 2016-09-03 ENCOUNTER — Ambulatory Visit (INDEPENDENT_AMBULATORY_CARE_PROVIDER_SITE_OTHER): Payer: BLUE CROSS/BLUE SHIELD | Admitting: Licensed Clinical Social Worker

## 2016-09-03 DIAGNOSIS — F4310 Post-traumatic stress disorder, unspecified: Secondary | ICD-10-CM | POA: Diagnosis not present

## 2016-09-03 DIAGNOSIS — F331 Major depressive disorder, recurrent, moderate: Secondary | ICD-10-CM

## 2016-09-03 NOTE — Progress Notes (Signed)
THERAPIST PROGRESS NOTE  Session Time: 3:10-4pm  Participation Level: Active  Behavioral Response: CasualAlert/Depressed  Type of Therapy: Individual Therapy  Treatment Goals addressed: Coping  Interventions: Supportive  Summary: ASHANTE YELLIN is a 56 y.o who presents less depressed today. However she appears more anxious. Pt discussed her psychiatric symptoms and current life events.Asked pt open-ended questions about her stressors. Pt pt new grounding technique and showed pt how to formulate healthier alternative thoughts. Discussed healthy coping skills. Pt was receptive to the grounding technique and mindfulness practice. Encouraged pt to continue to work on her self-care.       Suicidal/Homicidal: Nowithout intent/plan  Therapist Response: Assessed pt's current functioning and reviewed progress. assisted patient processing for management of current stressors.  Plan: Return again in 1 week using CBT and continue in OP group.  Diagnosis: Axis I: Major Depressive Disorder, recurrent  Episode, moderate Noah Lembke S, LCAS 09/03/16

## 2016-09-03 NOTE — Progress Notes (Signed)
THERAPIST PROGRESS NOTE  Session Time: 3:10-4pm  Participation Level: Active  Behavioral Response: CasualAlert/Depressed  Type of Therapy: Individual Therapy  Treatment Goals addressed: Coping  Interventions: Supportive  Summary: Christine Douglas is a 56 y.o who presents less depressed today. Pt received  news about her oldest daughter who has been dx with cancer previously however her daughter has not been to an oncologist so does not know full results of the dx. Pt has decided until there is a treatment plan and dx she is choosing not to focus on this. Prestique is still working well with no side effects and she still feels "good." Her caretaking duties of her mother seems to be less stressful, however there is a lot to do in the morning before work and the evening after work. Processed again being in the moment and mindfulness. Pt is still coming to the OP group and says she enjoys the suggestions and support from the group. Pt was activly engaged in the intervention.      Suicidal/Homicidal: Nowithout intent/plan  Therapist Response: Assessed pt's current functioning and reviewed progress. assisted patient processing for management of current stressors.  Plan: Return again in 1 week using CBT and continue in OP group.  Diagnosis: Axis I: Major Depressive Disorder, recurrent  Episode, moderate [name],[name], LCAS 08/27/16

## 2016-09-04 ENCOUNTER — Ambulatory Visit (HOSPITAL_COMMUNITY): Payer: Self-pay | Admitting: Licensed Clinical Social Worker

## 2016-09-04 ENCOUNTER — Encounter (HOSPITAL_COMMUNITY): Payer: Self-pay | Admitting: Licensed Clinical Social Worker

## 2016-09-04 NOTE — Progress Notes (Signed)
Daily Group Progress Note Program:  Outpatient  Group Time: 5:30-6:30 pm  Participation Level: Active  Behavioral Response: Appropriate  Type of Therapy:  Psychoeducation/Therapy  Summary of Progress: Pt participated in a discussion on childhood experiences affecting adult mental wellness. Discussion ensued on how survivors of negative childhood experiences often tend to develop unhealthy coping skills and difficulty regulating emotions, which effect adult mental wellness. Pt was encouraged to continue using positive coping skills, use mindfulness practice and develop a positive support system. Pt actively engaged during the intervention and was receptive to suggestions and feedback.  Treesa Mccully S. Dyamon Sosinski, LCAS 

## 2016-09-06 DIAGNOSIS — G43009 Migraine without aura, not intractable, without status migrainosus: Secondary | ICD-10-CM | POA: Diagnosis not present

## 2016-09-06 DIAGNOSIS — F410 Panic disorder [episodic paroxysmal anxiety] without agoraphobia: Secondary | ICD-10-CM | POA: Diagnosis not present

## 2016-09-06 DIAGNOSIS — F4312 Post-traumatic stress disorder, chronic: Secondary | ICD-10-CM | POA: Diagnosis not present

## 2016-09-09 ENCOUNTER — Telehealth (HOSPITAL_COMMUNITY): Payer: Self-pay | Admitting: Licensed Clinical Social Worker

## 2016-09-10 ENCOUNTER — Ambulatory Visit (INDEPENDENT_AMBULATORY_CARE_PROVIDER_SITE_OTHER): Payer: BLUE CROSS/BLUE SHIELD | Admitting: Licensed Clinical Social Worker

## 2016-09-10 DIAGNOSIS — F4310 Post-traumatic stress disorder, unspecified: Secondary | ICD-10-CM

## 2016-09-11 ENCOUNTER — Encounter (HOSPITAL_COMMUNITY): Payer: Self-pay | Admitting: Licensed Clinical Social Worker

## 2016-09-11 ENCOUNTER — Ambulatory Visit (HOSPITAL_COMMUNITY): Payer: Self-pay | Admitting: Licensed Clinical Social Worker
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
## 2016-09-11 NOTE — Progress Notes (Signed)
Daily Group Progress Note Program:  OP Evening Group   Group Time: 5:30-6:30  Participation Level: Active  Behavioral Response: Appropriate  Type of Therapy:  Psychoeducation/Therapy  Summary of Progress:  Pt participated in check-in, describing psychiatric symptoms and current life events. Pt participated in a discussion on co-dependency, traits and recovery.  Pt was encouraged to use her tools of recovery to develop and maintain healthy relationships.  [name]. [name], LCAS 

## 2016-09-17 ENCOUNTER — Ambulatory Visit (INDEPENDENT_AMBULATORY_CARE_PROVIDER_SITE_OTHER): Payer: BLUE CROSS/BLUE SHIELD | Admitting: Licensed Clinical Social Worker

## 2016-09-17 DIAGNOSIS — F331 Major depressive disorder, recurrent, moderate: Secondary | ICD-10-CM | POA: Diagnosis not present

## 2016-09-18 ENCOUNTER — Ambulatory Visit (HOSPITAL_COMMUNITY): Payer: Self-pay | Admitting: Licensed Clinical Social Worker

## 2016-09-19 ENCOUNTER — Encounter (HOSPITAL_COMMUNITY): Payer: Self-pay | Admitting: Licensed Clinical Social Worker

## 2016-09-19 NOTE — Progress Notes (Signed)
Daily Group Progress Note Program:  Outpatient Group Time: 5:30-6:30pm  Participation Level: Active  Behavioral Response: Appropriate  Type of Therapy:  Psychoeducation/Therapy  Summary of Progress: Pt participated in a discussion on the 3 emotional states of mind: reasonable mind, emotional mind and wise mind. Pt identified which mind she is most familiar with and has a hard time understanding the reasonable mind. Pt was encouraged to continue to work on her mindfulness activities that will influence behavior and mental state to achieve the wise mind.  [name]. [name], LCAS 

## 2016-09-23 ENCOUNTER — Encounter (HOSPITAL_COMMUNITY): Payer: Self-pay | Admitting: Licensed Clinical Social Worker

## 2016-09-23 NOTE — Progress Notes (Signed)
THERAPIST PROGRESS NOTE  Session Time: 4:10-5pm  Participation Level: Active  Behavioral Response: CasualAlert/Depressed  Type of Therapy: Individual Therapy  Treatment Goals addressed: Coping  Interventions: Supportive  Summary: Christine CarawayLesa W Douglas is 55yo who presents with depression for her individual counseling session. Pt discussed her psychiatric symptoms and current life events. Pt shared tearfully that none of her 3 children contacted her on Mothers' Day. "I feel numb." Worked again with pt on her distress tolerance skills, mindfulness of breath and mindful awareness of situations. Requested pt to continue to work on these skills as homework.  Suicidal/Homicidal: No without intent/plan  Therapist Response: Assessed pt's current functioning and reviewed progress.Assisted pt processing for distress tolerance skills and mindful awareness   Plan: Return again in 1 week using CBT and continue in OP group.  Diagnosis: Axis I: Major Depressive Disorder, recurrent  Episode, moderate [name],[name], LCAS 09/17/16

## 2016-09-24 ENCOUNTER — Ambulatory Visit (INDEPENDENT_AMBULATORY_CARE_PROVIDER_SITE_OTHER): Payer: BLUE CROSS/BLUE SHIELD | Admitting: Licensed Clinical Social Worker

## 2016-09-24 DIAGNOSIS — F331 Major depressive disorder, recurrent, moderate: Secondary | ICD-10-CM

## 2016-09-24 NOTE — Progress Notes (Signed)
THERAPIST PROGRESS NOTE  Session Time: 4:10-5pm  Participation Level: Active  Behavioral Response: CasualAlert/Depressed  Type of Therapy: Individual Therapy  Treatment Goals addressed: Coping  Interventions: Supportive  Summary: Christine CarawayLesa W Douglas is 55yo who presents with depression for her individual counseling session. Pt discussed her psychiatric symptoms and current life events. Pt is trying to work on herself. "I want my old self back." Asked open ended questions about the meaning of this statement, how far back do you want to go? "I want to be happy, I don't want people (including my family) to infringe in  My space, I want to care for my mother but I don't want it all consuming, Worked with pt problem solving strategies for her to get what she wants. Pt became overwhelmed with strategies so taught her new mindfulness practice. Pt was able to use this practice to become more centered. Encouraged pt to practice her grounding techniques to assist with her mindfulness.  Suicidal/Homicidal: No without intent/plan  Therapist Response: Assessed pt's current functioning and reviewed progress.Assisted pt processing for problem solving skills, strategies for getting what she wants and mindful awareness   Plan: Return again in 1 week using CBT and continue in OP group.  Diagnosis: Axis I: Major Depressive Disorder, recurrent  Episode, moderate  Christine Douglas S, LCAS 09/24/16

## 2016-09-25 ENCOUNTER — Encounter (HOSPITAL_COMMUNITY): Payer: Self-pay | Admitting: Licensed Clinical Social Worker

## 2016-09-25 ENCOUNTER — Ambulatory Visit (HOSPITAL_COMMUNITY): Payer: Self-pay | Admitting: Licensed Clinical Social Worker

## 2016-09-25 DIAGNOSIS — J019 Acute sinusitis, unspecified: Secondary | ICD-10-CM | POA: Diagnosis not present

## 2016-09-25 NOTE — Telephone Encounter (Signed)
Pt called to say her mother was in the hospital over the weekend for her lungs. Her mother is not gong to do a biopsy. Pt thinks her mother is depression. Pt had some issues with her daughter and son in law over the weekend.  Ottis StainLisbeth [name], LCAS

## 2016-09-25 NOTE — Progress Notes (Signed)
Daily Group Progress Note Program:  Outpatient Group Time: 5:30-6:30pm  Participation Level: Active  Behavioral Response: Appropriate  Type of Therapy:  Psychoeducation/Therapy  Summary of Progress: Pt participated in a discussion on acceptance. Pt talked about the stigma of mental illness, how somehow she could control it "if she only tried," or "it's just a phase you're going through." Pt was encouraged to encourage equality when speaking about mental illness and physical illness when talking about her disease.  [name]. [name], LCAS 

## 2016-10-01 ENCOUNTER — Ambulatory Visit (INDEPENDENT_AMBULATORY_CARE_PROVIDER_SITE_OTHER): Payer: BLUE CROSS/BLUE SHIELD | Admitting: Licensed Clinical Social Worker

## 2016-10-01 DIAGNOSIS — F331 Major depressive disorder, recurrent, moderate: Secondary | ICD-10-CM | POA: Diagnosis not present

## 2016-10-01 DIAGNOSIS — F4310 Post-traumatic stress disorder, unspecified: Secondary | ICD-10-CM | POA: Diagnosis not present

## 2016-10-02 ENCOUNTER — Ambulatory Visit (HOSPITAL_COMMUNITY): Payer: Self-pay | Admitting: Licensed Clinical Social Worker

## 2016-10-02 ENCOUNTER — Encounter (HOSPITAL_COMMUNITY): Payer: Self-pay | Admitting: Licensed Clinical Social Worker

## 2016-10-02 NOTE — Progress Notes (Signed)
Daily Group Progress Note     Program: OP   Group Time: 5:30-6:30  Participation Level: Active  Behavioral Response: Appropriate  Type of Therapy:  Psychoeducation/Therapy  Summary of Progress: Pt participated in a discussion on shame and self-compassion. Pt was able to identify a shaming experience during childhood or adulthood and share the powerful emotion of shame. Pt was encouraged to use self-compassion to talk to herself with the same kindness, caring and compassion shown to others.   [name]. [name], LCAS  

## 2016-10-02 NOTE — Progress Notes (Signed)
THERAPIST PROGRESS NOTE  Session Time: 4:10-5pm  Participation Level: Active  Behavioral Response: CasualAlert/Depressed  Type of Therapy: Individual Therapy  Treatment Goals addressed: Coping  Interventions: Supportive  Summary: Christine Douglas is 56yo who presents with depression for her individual counseling session. Pt discussed her psychiatric symptoms and current life events. Pt is interested on finding her spiritual self. Ask open-ended questions and assisted pt problem-solving and strategy setting to work on this. Pt's birthday was Sunday and met up with an old girlfriend and had a good day. No response from her daughters. Discussed basic CBT concepts and the thought emotion connection and alternative perspectives. Pt was open to this concept. Pt has been using her grounding techniques practices to assist with mindfulness. Pt was open to suggestions during the intervention.    Suicidal/Homicidal: No without intent/plan  Therapist Response: Assessed pt's current functioning and reviewed progress.Assisted pt processing for problem solving skills, strategies for getting what she wants and mindful awareness   Plan: Return again in 1 week using CBT and continue in OP group.  Diagnosis: Axis I: Major Depressive Disorder, recurrent  Episode, moderate  Taleia Sadowski S, LCAS 5/92/18

## 2016-10-04 ENCOUNTER — Other Ambulatory Visit (HOSPITAL_COMMUNITY): Payer: Self-pay | Admitting: Psychiatry

## 2016-10-04 DIAGNOSIS — F332 Major depressive disorder, recurrent severe without psychotic features: Secondary | ICD-10-CM

## 2016-10-04 DIAGNOSIS — F4310 Post-traumatic stress disorder, unspecified: Secondary | ICD-10-CM

## 2016-10-08 ENCOUNTER — Ambulatory Visit (HOSPITAL_COMMUNITY): Payer: Self-pay | Admitting: Licensed Clinical Social Worker

## 2016-10-08 ENCOUNTER — Other Ambulatory Visit (HOSPITAL_COMMUNITY): Payer: Self-pay

## 2016-10-08 DIAGNOSIS — F4310 Post-traumatic stress disorder, unspecified: Secondary | ICD-10-CM

## 2016-10-08 DIAGNOSIS — F332 Major depressive disorder, recurrent severe without psychotic features: Secondary | ICD-10-CM

## 2016-10-08 MED ORDER — DESVENLAFAXINE SUCCINATE ER 50 MG PO TB24: 50.0000 mg | ORAL_TABLET | Freq: Every day | ORAL | 0 refills | Status: DC

## 2016-10-10 ENCOUNTER — Ambulatory Visit (INDEPENDENT_AMBULATORY_CARE_PROVIDER_SITE_OTHER): Payer: BLUE CROSS/BLUE SHIELD | Admitting: Psychiatry

## 2016-10-10 ENCOUNTER — Encounter (HOSPITAL_COMMUNITY): Payer: Self-pay | Admitting: Psychiatry

## 2016-10-10 DIAGNOSIS — F909 Attention-deficit hyperactivity disorder, unspecified type: Secondary | ICD-10-CM

## 2016-10-10 DIAGNOSIS — Z87891 Personal history of nicotine dependence: Secondary | ICD-10-CM

## 2016-10-10 DIAGNOSIS — F332 Major depressive disorder, recurrent severe without psychotic features: Secondary | ICD-10-CM

## 2016-10-10 DIAGNOSIS — Z79899 Other long term (current) drug therapy: Secondary | ICD-10-CM

## 2016-10-10 DIAGNOSIS — Z884 Allergy status to anesthetic agent status: Secondary | ICD-10-CM | POA: Diagnosis not present

## 2016-10-10 DIAGNOSIS — Z818 Family history of other mental and behavioral disorders: Secondary | ICD-10-CM

## 2016-10-10 DIAGNOSIS — F4310 Post-traumatic stress disorder, unspecified: Secondary | ICD-10-CM

## 2016-10-10 DIAGNOSIS — Z888 Allergy status to other drugs, medicaments and biological substances status: Secondary | ICD-10-CM

## 2016-10-10 MED ORDER — DESVENLAFAXINE SUCCINATE ER 100 MG PO TB24: 100.0000 mg | ORAL_TABLET | Freq: Every day | ORAL | 0 refills | Status: DC
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
## 2016-10-15 ENCOUNTER — Ambulatory Visit (INDEPENDENT_AMBULATORY_CARE_PROVIDER_SITE_OTHER): Payer: BLUE CROSS/BLUE SHIELD | Admitting: Licensed Clinical Social Worker

## 2016-10-15 DIAGNOSIS — F331 Major depressive disorder, recurrent, moderate: Secondary | ICD-10-CM

## 2016-10-15 DIAGNOSIS — F4310 Post-traumatic stress disorder, unspecified: Secondary | ICD-10-CM

## 2016-10-16 ENCOUNTER — Encounter (HOSPITAL_COMMUNITY): Payer: Self-pay | Admitting: Licensed Clinical Social Worker

## 2016-10-16 NOTE — Progress Notes (Signed)
Daily Group Progress Note     Program: OP   Group Time: 5:30-6:30  Participation Level: Active  Behavioral Response: Appropriate  Type of Therapy:  Psychoeducation/Therapy  Summary of Progress: Pt participated in a discussion on grounding techniques, sensory awareness and cognitive awareness. Grounding exercises helped the patient to reorient to the here-and-now and in reality. Pt was encouraged to use the grounding exercises to regain mental focus from an anxious state.  Quandarius Nill S. Rainey Rodger, LCAS   
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
## 2016-10-22 ENCOUNTER — Ambulatory Visit (INDEPENDENT_AMBULATORY_CARE_PROVIDER_SITE_OTHER): Payer: BLUE CROSS/BLUE SHIELD | Admitting: Licensed Clinical Social Worker

## 2016-10-22 DIAGNOSIS — F331 Major depressive disorder, recurrent, moderate: Secondary | ICD-10-CM

## 2016-10-23 ENCOUNTER — Encounter (HOSPITAL_COMMUNITY): Payer: Self-pay | Admitting: Licensed Clinical Social Worker

## 2016-10-23 NOTE — Progress Notes (Signed)
Daily Group Progress Note     Program: OP   Group Time: 5:30-6:30  Participation Level: Active  Behavioral Response: Appropriate  Type of Therapy:  Psychoeducation/Therapy  Summary of Progress: Pt participated in a discussion on hope. Pt experiences symptoms of  PTSD which has led to feelings of hopelessness. Pt was encouraged to continue to aim to have a satisfying life despite limitations posed by her symptoms of PTSD. Pt was given a grounding exercise "mindfulness of the breath" handout.   Vernona RiegerLisbeth S. Zayleigh Stroh, LCAS

## 2016-10-23 NOTE — Progress Notes (Signed)
THERAPIST PROGRESS NOTE  Session Time: 2:10-3pm  Participation Level: Active  Behavioral Response: CasualAlert/Depressed  Type of Therapy: Individual Therapy  Treatment Goals addressed: Coping  Interventions: Supportive  Summary: Christine Douglas is 55yo who presents with depression for her individual counseling session. Pt discussed her psychiatric symptoms and current life events. Pt thinks her medication is finally working well. She has no side effects of the medication. She feels like she's 90% back to her old self. She has worked hard in therapy trying to get her old self back. Pt has been using her boundaries with her 2 daughters, which have been difficult because they are angry with her because of them. Pt is still caring for her mother, which proves difficult but pt will not put her in a facility. Pt has cut her nails to a natural length and had her hair cut in a nice bob. She reports this is part of her self care. She went to the pool on Sunday by herself for r&r. Her major stressor is her mother but she has tools in place that she uses during stressful times. Validated with pt all the positive changes she is making in her life and all the ways she is working on her self care.     Suicidal/Homicidal: No without intent/plan  Therapist Response: Assessed pt's current functioning and reviewed progress.Assisted pt processing for problem solving skills, strategies for getting what she wants and mindful awareness   Plan: Return again in 1 week using CBT and continue in OP group.  Diagnosis: Axis I: Major Depressive Disorder, recurrent  Episode, moderate  [name],[name], LCAS 10/22/16

## 2016-10-29 ENCOUNTER — Ambulatory Visit (INDEPENDENT_AMBULATORY_CARE_PROVIDER_SITE_OTHER): Payer: BLUE CROSS/BLUE SHIELD | Admitting: Licensed Clinical Social Worker

## 2016-10-29 DIAGNOSIS — F331 Major depressive disorder, recurrent, moderate: Secondary | ICD-10-CM

## 2016-11-01 ENCOUNTER — Ambulatory Visit (INDEPENDENT_AMBULATORY_CARE_PROVIDER_SITE_OTHER): Payer: BLUE CROSS/BLUE SHIELD | Admitting: Women's Health

## 2016-11-01 ENCOUNTER — Encounter: Payer: Self-pay | Admitting: Women's Health

## 2016-11-01 VITALS — BP 102/78 | HR 92 | Wt 108.0 lb

## 2016-11-01 DIAGNOSIS — B373 Candidiasis of vulva and vagina: Secondary | ICD-10-CM | POA: Diagnosis not present

## 2016-11-01 DIAGNOSIS — Z780 Asymptomatic menopausal state: Secondary | ICD-10-CM

## 2016-11-01 DIAGNOSIS — N9089 Other specified noninflammatory disorders of vulva and perineum: Secondary | ICD-10-CM

## 2016-11-01 DIAGNOSIS — R140 Abdominal distension (gaseous): Secondary | ICD-10-CM

## 2016-11-01 DIAGNOSIS — L298 Other pruritus: Secondary | ICD-10-CM | POA: Diagnosis not present

## 2016-11-01 DIAGNOSIS — B3731 Acute candidiasis of vulva and vagina: Secondary | ICD-10-CM

## 2016-11-01 DIAGNOSIS — N898 Other specified noninflammatory disorders of vagina: Secondary | ICD-10-CM

## 2016-11-01 LAB — POCT WET PREP (WET MOUNT)
Clue Cells Wet Prep Whiff POC: NEGATIVE
TRICHOMONAS WET PREP HPF POC: ABSENT

## 2016-11-01 MED ORDER — FLUCONAZOLE 150 MG PO TABS: 150.0000 mg | ORAL_TABLET | Freq: Once | ORAL | 0 refills | Status: AC
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
## 2016-11-04 ENCOUNTER — Telehealth: Payer: Self-pay | Admitting: Women's Health

## 2016-11-04 ENCOUNTER — Encounter (HOSPITAL_COMMUNITY): Payer: Self-pay | Admitting: Licensed Clinical Social Worker

## 2016-11-04 LAB — HERPES SIMPLEX VIRUS CULTURE

## 2016-11-04 NOTE — Progress Notes (Signed)
Daily Group Progress Note     Program: OP   Group Time: 5:30-6:30  Participation Level: Active  Behavioral Response: Appropriate  Type of Therapy:  Psychoeducation/Therapy  Summary of Progress: Pt participated in a discussion on emotional first aid. Pt watched Ted Talk Emotional First Aid with Guy Winch, with a handout "7 Ways to Practice Emotional First Aid." Pt was encouraged to understand her emotional resilience and take note of her psychological health on a regular basis.  [name]. [name], LCAS  

## 2016-11-04 NOTE — Telephone Encounter (Signed)
LM that culture (HSV) was neg.  Cheral MarkerKimberly R. Biran Douglas, CNM, WHNP-BC 11/04/2016 10:05 AM

## 2016-11-05 ENCOUNTER — Encounter (HOSPITAL_COMMUNITY): Payer: Self-pay | Admitting: Licensed Clinical Social Worker

## 2016-11-05 ENCOUNTER — Ambulatory Visit (HOSPITAL_COMMUNITY): Payer: Self-pay | Admitting: Licensed Clinical Social Worker

## 2016-11-05 ENCOUNTER — Ambulatory Visit (INDEPENDENT_AMBULATORY_CARE_PROVIDER_SITE_OTHER): Payer: BLUE CROSS/BLUE SHIELD | Admitting: Licensed Clinical Social Worker

## 2016-11-05 DIAGNOSIS — F331 Major depressive disorder, recurrent, moderate: Secondary | ICD-10-CM | POA: Diagnosis not present

## 2016-11-05 NOTE — Progress Notes (Signed)
THERAPIST PROGRESS NOTE  Session Time: 2:10-3pm  Participation Level: Active  Behavioral Response: CasualAlert/Depressed  Type of Therapy: Individual Therapy  Treatment Goals addressed: Coping  Interventions: Supportive  Summary: Christine Douglas is 55yo who presents for her individual counseling session. Pt discussed her psychiatric symptoms and current life events. Pt states her medication is working well. She has no side effects of the medication. She feels the best in 5 years. Pt is seeking to minimize her lifestyle. She is using boundaries with her children, hired a Engineer, civil (consulting)nurse to assist with her mother's care, and is continuing to work on her self-care. Validated pt in the choices she's making. Pt struggles with what people say or think about her (people from her past, her daughters, friends) Taught pt emotional regulation skills to assist pt in challenging her negative thoughts and formulating healthier thoughts. Pt completed worksheet on Radical acceptance and processed worksheet with pt. Pt reflected on how radical acceptance will assist her in areas she struggles with. Pt was receptive during the intervention. Pt continues to use her healthy coping skills for stressors.   Suicidal/Homicidal: No without intent/plan  Therapist Response: Assessed pt's current functioning and reviewed progress.Assisted pt processing validation, struggles and emotional regulation skills. Assisted pt processing for the management of her stressors.   Plan: Return again in 2 weeks using CBT and continue in OP group.  Diagnosis: Axis I: Major Depressive Disorder, recurrent  Episode, moderate  Prisha Hiley S, LCAS 11/04/16

## 2016-11-07 ENCOUNTER — Ambulatory Visit (HOSPITAL_COMMUNITY): Payer: Self-pay | Admitting: Psychiatry

## 2016-11-08 ENCOUNTER — Ambulatory Visit: Payer: BLUE CROSS/BLUE SHIELD | Admitting: Obstetrics and Gynecology

## 2016-11-08 ENCOUNTER — Encounter: Payer: Self-pay | Admitting: *Deleted

## 2016-11-08 ENCOUNTER — Other Ambulatory Visit: Payer: BLUE CROSS/BLUE SHIELD

## 2016-11-12 ENCOUNTER — Ambulatory Visit (HOSPITAL_COMMUNITY): Payer: Self-pay | Admitting: Licensed Clinical Social Worker

## 2016-11-13 ENCOUNTER — Encounter (HOSPITAL_COMMUNITY): Payer: Self-pay | Admitting: Licensed Clinical Social Worker

## 2016-11-13 DIAGNOSIS — R350 Frequency of micturition: Secondary | ICD-10-CM | POA: Diagnosis not present

## 2016-11-19 ENCOUNTER — Ambulatory Visit (HOSPITAL_COMMUNITY): Payer: Self-pay | Admitting: Licensed Clinical Social Worker

## 2016-11-20 ENCOUNTER — Telehealth: Payer: Self-pay | Admitting: *Deleted

## 2016-11-20 ENCOUNTER — Ambulatory Visit: Payer: BLUE CROSS/BLUE SHIELD | Admitting: Obstetrics and Gynecology

## 2016-11-20 ENCOUNTER — Other Ambulatory Visit: Payer: BLUE CROSS/BLUE SHIELD

## 2016-11-20 NOTE — Telephone Encounter (Signed)
Informed patient HSV culture was negative.

## 2016-11-21 DIAGNOSIS — E739 Lactose intolerance, unspecified: Secondary | ICD-10-CM | POA: Diagnosis not present

## 2016-11-21 DIAGNOSIS — F4312 Post-traumatic stress disorder, chronic: Secondary | ICD-10-CM | POA: Diagnosis not present

## 2016-11-21 DIAGNOSIS — F410 Panic disorder [episodic paroxysmal anxiety] without agoraphobia: Secondary | ICD-10-CM | POA: Diagnosis not present

## 2016-11-21 DIAGNOSIS — F310 Bipolar disorder, current episode hypomanic: Secondary | ICD-10-CM | POA: Diagnosis not present

## 2016-11-28 ENCOUNTER — Encounter (INDEPENDENT_AMBULATORY_CARE_PROVIDER_SITE_OTHER): Payer: Self-pay

## 2016-11-28 ENCOUNTER — Ambulatory Visit (INDEPENDENT_AMBULATORY_CARE_PROVIDER_SITE_OTHER): Payer: BLUE CROSS/BLUE SHIELD | Admitting: Obstetrics and Gynecology

## 2016-11-28 ENCOUNTER — Ambulatory Visit (INDEPENDENT_AMBULATORY_CARE_PROVIDER_SITE_OTHER): Payer: BLUE CROSS/BLUE SHIELD

## 2016-11-28 ENCOUNTER — Encounter: Payer: Self-pay | Admitting: Obstetrics and Gynecology

## 2016-11-28 VITALS — BP 120/60 | HR 86 | Wt 110.8 lb

## 2016-11-28 DIAGNOSIS — R140 Abdominal distension (gaseous): Secondary | ICD-10-CM | POA: Diagnosis not present

## 2016-11-28 DIAGNOSIS — Z780 Asymptomatic menopausal state: Secondary | ICD-10-CM

## 2016-11-28 DIAGNOSIS — N838 Other noninflammatory disorders of ovary, fallopian tube and broad ligament: Secondary | ICD-10-CM | POA: Diagnosis not present

## 2016-11-28 NOTE — Progress Notes (Signed)
   [facility] Visit  11/28/2016            Patient name: Christine Douglas MRN [id number]  Date of birth: 04/18/1961  CC & HPI:  Christine Douglas is a 56 y.o. female presenting today for f/u of vulvovaginal candida and vulvar lesion, with pelvic U/S done today. Pt has been taking an abx for bloating. She is concerned about her health due to her daughter   ROS:  ROS  -pain -fever   Pertinent History Reviewed:   Reviewed: Significant for hysterectomy, C-section Medical         Past Medical History:  Diagnosis Date  . ADHD (attention deficit hyperactivity disorder)   . Anxiety   . BV (bacterial vaginosis)   . Depression   . Headache   . 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 Restless leg syndrome                               Surgical Hx:    Past Surgical History:  Procedure Laterality Date  . ABDOMINAL HYSTERECTOMY     partial hysterectomy  . BACK SURGERY     spine surgery; 2 screws and plate in neck  . CESAREAN SECTION    . SHOULDER ARTHROSCOPY WITH ROTATOR CUFF REPAIR AND SUBACROMIAL DECOMPRESSION  06/04/2012   Procedure: SHOULDER ARTHROSCOPY WITH ROTATOR CUFF REPAIR AND SUBACROMIAL DECOMPRESSION;  Surgeon: Loreta Aveaniel F Murphy, MD;  Location: [facility];  Service: Orthopedics;  Laterality: Left;  LEFT ARTHROSCOPY SHOULDER DECOMPRESSION SUBACROMIAL PARTIAL ACROMIOPLASTY WITH CORACOACROMIAL RELEASE, DISTAL CLAVICULECTOMY,  ROTATOR CUFF REPAIR    Medications: Reviewed & Updated - see associated section                       Current Outpatient Prescriptions:  .  desvenlafaxine (PRISTIQ) 100 MG 24 hr tablet, Take 1 tablet (100 mg total) by mouth daily., Disp: 90 tablet, Rfl: 0 .  diazepam (VALIUM) 10 MG tablet, Take 10 mg by mouth 3 (three) times daily., Disp: , Rfl: 4 .  VYVANSE 50 MG capsule, Take 50 mg by mouth daily., Disp: , Rfl: 0   Social History: Reviewed -  reports that she has quit smoking. She has never used smokeless  tobacco.  Objective Findings:  Vitals: Blood pressure 120/60, pulse 86, weight 110 lb 12.8 oz (50.3 kg).  Physical Examination: General appearance - alert, well appearing, and in no distress Mental status - alert, oriented to person, place, and time Pelvic - not indicated  Discussion:  Ultrasound discussed with pt. Benefits and risks of removing tubes discussed as well.   Assessment & Plan:   A:  1. 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 CA125, proceed toward bilateral laparoscopic hysterecopy    By signing my name below, I, Izna Ahmed, attest that this documentation has been prepared under the direction and in the presence of Tilda BurrowFerguson, Natayla Cadenhead V, MD. Electronically Signed: Redge GainerIzna Ahmed, ED Scribe. 11/28/16. 9:49 AM.  I personally performed the services described in this documentation, which was SCRIBED in my presence. The recorded information has been reviewed and considered accurate. It has been edited as necessary during review. Tilda BurrowFERGUSON,Jesiah Yerby V, MD

## 2016-11-28 NOTE — Progress Notes (Signed)
PELVIC US TA/TV: normal vaginal cuff,cystic tubular structure right adnexa 4.3 x 3.4 x 4.4 cm,right ovary not visualized,normal left ovary,no free fluid,no pain during ultrasound,left ovary appears mobile

## 2016-11-29 ENCOUNTER — Ambulatory Visit: Payer: Self-pay | Admitting: Physician Assistant

## 2016-11-29 LAB — CA 125: Cancer Antigen (CA) 125: 8.5 U/mL (ref 0.0–38.1)

## 2016-12-02 ENCOUNTER — Encounter: Payer: Self-pay | Admitting: Physician Assistant

## 2016-12-02 ENCOUNTER — Telehealth: Payer: Self-pay | Admitting: *Deleted

## 2016-12-02 NOTE — Telephone Encounter (Signed)
Patient called with concerns of u/s wanting to speak to ultrasonographer or Dr Emelda FearFerguson so interpretation. Informed patient neither were available today and she could get more information from him at her visit on 8/2. Patient states she is very worried. Advised to keep appointment and get all questions answered then. Verbalized understanding.

## 2016-12-03 ENCOUNTER — Ambulatory Visit (HOSPITAL_COMMUNITY): Payer: Self-pay | Admitting: Licensed Clinical Social Worker

## 2016-12-05 ENCOUNTER — Ambulatory Visit (HOSPITAL_COMMUNITY): Payer: Self-pay | Admitting: Psychiatry

## 2016-12-05 ENCOUNTER — Encounter: Payer: Self-pay | Admitting: Obstetrics and Gynecology

## 2016-12-05 ENCOUNTER — Ambulatory Visit (INDEPENDENT_AMBULATORY_CARE_PROVIDER_SITE_OTHER): Payer: BLUE CROSS/BLUE SHIELD | Admitting: Obstetrics and Gynecology

## 2016-12-05 DIAGNOSIS — N838 Other noninflammatory disorders of ovary, fallopian tube and broad ligament: Secondary | ICD-10-CM

## 2016-12-05 DIAGNOSIS — R350 Frequency of micturition: Secondary | ICD-10-CM

## 2016-12-05 DIAGNOSIS — F410 Panic disorder [episodic paroxysmal anxiety] without agoraphobia: Secondary | ICD-10-CM | POA: Diagnosis not present

## 2016-12-05 LAB — POCT URINALYSIS DIPSTICK
Blood, UA: NEGATIVE
GLUCOSE UA: NEGATIVE
KETONES UA: NEGATIVE
LEUKOCYTES UA: NEGATIVE
NITRITE UA: NEGATIVE
Protein, UA: NEGATIVE

## 2016-12-05 NOTE — Progress Notes (Signed)
[facility] Visit  12/05/2016            Patient name: Christine Douglas MRN [id number]  Date of birth: [birth year]  CC & HPI:  Christine Douglas is a 56 y.o. female presenting today for f/u of CA125 screening and Rt tubo-ovarian mass, to discuss proceeding towards bilateral laparoscopic oophorectomy as  was discussed in previous visit on 11/28/16.  Pt had a bladder tack done during her previous hysterectomy. And has multiple concerns and questions. Pt had Tummy tuck and wants assurance that surgery will not affect skin lines and tattoos. I attempt to help pt have realistic expectations, describing probable location of surgery sites and healing.  Pt has a small area of irregularity on RLQ that is likely surgical scarring p tummy tuck, not a hernia.  ROS:  ROS -fever -chills  Pertinent History Reviewed:   Reviewed: Significant for hysterectomy, BV, C-Section Medical         Past Medical History:  Diagnosis Date  . ADHD (attention deficit hyperactivity disorder)   . Anxiety   . BV (bacterial vaginosis)   . Depression   . Headache   . 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 Restless leg syndrome                               Surgical Hx:    Past Surgical History:  Procedure Laterality Date  . ABDOMINAL HYSTERECTOMY     partial hysterectomy  . BACK SURGERY     spine surgery; 2 screws and plate in neck  . CESAREAN SECTION    . SHOULDER ARTHROSCOPY WITH ROTATOR CUFF REPAIR AND SUBACROMIAL DECOMPRESSION  06/04/2012   Procedure: SHOULDER ARTHROSCOPY WITH ROTATOR CUFF REPAIR AND SUBACROMIAL DECOMPRESSION;  Surgeon: Loreta Aveaniel F Murphy, MD;  Location: [facility];  Service: Orthopedics;  Laterality: Left;  LEFT ARTHROSCOPY SHOULDER DECOMPRESSION SUBACROMIAL PARTIAL ACROMIOPLASTY WITH CORACOACROMIAL RELEASE, DISTAL CLAVICULECTOMY,  ROTATOR CUFF REPAIR    Medications: Reviewed & Updated - see associated section                       Current Outpatient  Prescriptions:  .  desvenlafaxine (PRISTIQ) 100 MG 24 hr tablet, Take 1 tablet (100 mg total) by mouth daily., Disp: 90 tablet, Rfl: 0 .  diazepam (VALIUM) 10 MG tablet, Take 10 mg by mouth as needed. , Disp: , Rfl: 4 .  VYVANSE 50 MG capsule, Take 50 mg by mouth daily., Disp: , Rfl: 0   Social History: Reviewed -  reports that she has quit smoking. Her smoking use included Cigarettes. She has never used smokeless tobacco.  Objective Findings:  Vitals: Blood pressure 110/70, pulse 77, height 5\' 4"  (1.626 m), weight 109 lb 8 oz (49.7 kg).  Physical Examination: General appearance - alert, well appearing, and in no distress Mental status - alert, oriented to person, place, and time Pelvic -  VULVA: normal appearing vulva with no masses, tenderness or lesions,  VAGINA: normal appearing vagina with normal color and discharge, no lesions,  CERVIX: normal appearing cervix without discharge or lesions,  UTERUS: surgically absent, vaginal cuff well healed, ADNEXA: not done Good bladder support GYNECOLOGIC SONOGRAM   Christine Douglas is a 56 y.o. G3P3 s/p hysterectomy,she is here for a pelvic sonogram for bloating.  Vaginal cuff  wnl  Endometrium          N/a   Right ovary          cystic tubular structure right adnexa 4.3 x 3.4 x 4.4 cm,right ovary not visualized  Left ovary                1.3 x .9 x 1.4 cm, wnl  No free fluid   Technician Comments:  PELVIC US TA/TV: normal vaginal cuff,cystic tubular structure right adnexa 4.3 x 3.4 x 4.4 cm,right ovary not visualized,normal left ovary,no free fluid,no pain during ultrasound,left ovary appears mobile    E. I. du Pontmber J Carl 11/28/2016 9:17 AM Clinical Impression and recommendations:  I have reviewed the sonogram results above.  Combined with the patient's current clinical course, below are my impressions and any appropriate recommendations for management based on the sonographic findings:  1. Post hysterectomy  pelvic anatomy notable for tortuous tubular structure most consistent with hydrosalpinx. 2. Unable to see ovary on right. 3 Normal small left ovary. 4. No ascites. Options discussed with patient who desires excision.  Verlyn Lambert V      Assessment & Plan:   A:  1. 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 Pt is reminded again that the mass appears benign, and alternatives to surgery, including observation and rescan, reviewed with the patient.  P:  1. Schedule bilateral laparoscopic oophorectomy or salpingoophorectomy for 1st week of September. 2      By signing my name below, I, Izna Ahmed, attest that this documentation has been prepared under the direction and in the presence of Tilda BurrowFerguson, Latisa Belay V, MD. Electronically Signed: Redge GainerIzna Ahmed, Medical Scribe. 12/05/16. 11:55 AM.  I personally performed the services described in this documentation, which was SCRIBED in my presence. The recorded information has been reviewed and considered accurate. It has been edited as necessary during review. Tilda BurrowFERGUSON,Jru Pense V, MD

## 2016-12-17 ENCOUNTER — Ambulatory Visit (INDEPENDENT_AMBULATORY_CARE_PROVIDER_SITE_OTHER): Payer: Self-pay | Admitting: Licensed Clinical Social Worker

## 2016-12-17 ENCOUNTER — Encounter (HOSPITAL_COMMUNITY): Payer: Self-pay | Admitting: Licensed Clinical Social Worker

## 2016-12-17 DIAGNOSIS — F331 Major depressive disorder, recurrent, moderate: Secondary | ICD-10-CM

## 2016-12-17 NOTE — Progress Notes (Signed)
THERAPIST PROGRESS NOTE  Session Time: 2:10-3pm  Participation Level: Active  Behavioral Response: CasualAlert/Depressed  Type of Therapy: Individual Therapy  Treatment Goals addressed: Coping  Interventions: Supportive  Summary: Christine CarawayLesa W Douglas is 55yo who presents for her individual counseling session. Pt discussed her psychiatric symptoms and current life events. Pt Presents upset today. She is having problems with family relationships. Processed with pt boundaries, letting go and self care. Role played boundaries with pt. Taught pt emotional regulation skills. Assisted pt in challenging her personal beliefs and pt was able to formulate healthier alternative thoughts. Challenged pt to focus on herself and her personal self care. Pt is having difficulty with her health insurance and is not able to take her Vyvanse. Asked CNA Regain to speak with pt.  Suicidal/Homicidal: No without intent/plan  Therapist Response: Assessed pt's current functioning and reviewed progress.Assisted pt processing boundaries, struggles and emotional regulation skills. Assisted pt processing for the management of her stressors.   Plan: Return again in 2 weeks using CBT   Diagnosis: Axis I: Major Depressive Disorder, recurrent  Episode, moderate  [name],[name], LCAS 12/17/16

## 2016-12-18 DIAGNOSIS — G939 Disorder of brain, unspecified: Secondary | ICD-10-CM | POA: Diagnosis not present

## 2016-12-18 DIAGNOSIS — F410 Panic disorder [episodic paroxysmal anxiety] without agoraphobia: Secondary | ICD-10-CM | POA: Diagnosis not present

## 2016-12-18 DIAGNOSIS — F900 Attention-deficit hyperactivity disorder, predominantly inattentive type: Secondary | ICD-10-CM | POA: Diagnosis not present

## 2016-12-18 DIAGNOSIS — F310 Bipolar disorder, current episode hypomanic: Secondary | ICD-10-CM | POA: Diagnosis not present

## 2016-12-31 ENCOUNTER — Encounter (HOSPITAL_COMMUNITY): Payer: Self-pay | Admitting: Licensed Clinical Social Worker

## 2016-12-31 ENCOUNTER — Ambulatory Visit (INDEPENDENT_AMBULATORY_CARE_PROVIDER_SITE_OTHER): Payer: Self-pay | Admitting: Licensed Clinical Social Worker

## 2016-12-31 DIAGNOSIS — F331 Major depressive disorder, recurrent, moderate: Secondary | ICD-10-CM

## 2016-12-31 NOTE — Progress Notes (Signed)
THERAPIST PROGRESS NOTE  Session Time: 2:10-3pm  Participation Level: Active  Behavioral Response: CasualAlert/Depressed  Type of Therapy: Individual Therapy  Treatment Goals addressed: Coping  Interventions: Supportive  Summary: Christine Douglas is 55yo who presents for her individual counseling session. Pt discussed her psychiatric symptoms and current life events. Pt presented calm today. She was upbeat and reports she has stopped taking her add meds. She has begun using her boundaries with her family and friends. She has basically drawn a line in the sand. Taught pt emotional regulation skills/ Distraction_ ACCEPTS. Pt completed a worksheet and discussed the distress tolerance skills. Pt is working on her personal care and learning to take care of herself better.      Suicidal/Homicidal: No without intent/plan  Therapist Response: Assessed pt's current functioning and reviewed progress.Assisted pt processing boundaries, struggles and emotional regulation skills. Assisted pt processing for the management of her stressors.   Plan: Return again in 2 weeks using CBT   Diagnosis: Axis I: Major Depressive Disorder, recurrent  Episode, moderate  Jason Frisbee S, LCAS 12/31/16

## 2017-01-11 ENCOUNTER — Other Ambulatory Visit (HOSPITAL_COMMUNITY): Payer: Self-pay | Admitting: Psychiatry

## 2017-01-11 DIAGNOSIS — F4310 Post-traumatic stress disorder, unspecified: Secondary | ICD-10-CM

## 2017-01-11 DIAGNOSIS — F332 Major depressive disorder, recurrent severe without psychotic features: Secondary | ICD-10-CM

## 2017-01-14 ENCOUNTER — Ambulatory Visit (HOSPITAL_COMMUNITY): Payer: Self-pay | Admitting: Licensed Clinical Social Worker

## 2017-01-27 ENCOUNTER — Encounter (HOSPITAL_COMMUNITY): Payer: Self-pay | Admitting: Emergency Medicine

## 2017-01-27 ENCOUNTER — Emergency Department (HOSPITAL_COMMUNITY)
Admission: EM | Admit: 2017-01-27 | Discharge: 2017-01-27 | Disposition: A | Discharge status: Home / Self Care | Payer: Self-pay | Attending: Emergency Medicine | Admitting: Emergency Medicine

## 2017-01-27 ENCOUNTER — Telehealth (HOSPITAL_COMMUNITY): Payer: Self-pay

## 2017-01-27 DIAGNOSIS — R51 Headache: Secondary | ICD-10-CM | POA: Insufficient documentation

## 2017-01-27 DIAGNOSIS — Z87891 Personal history of nicotine dependence: Secondary | ICD-10-CM | POA: Insufficient documentation

## 2017-01-27 DIAGNOSIS — R519 Headache, unspecified: Secondary | ICD-10-CM

## 2017-01-27 DIAGNOSIS — Z79899 Other long term (current) drug therapy: Secondary | ICD-10-CM | POA: Insufficient documentation

## 2017-01-27 DIAGNOSIS — R21 Rash and other nonspecific skin eruption: Secondary | ICD-10-CM

## 2017-01-27 HISTORY — DX: Female infertility of tubal origin: N97.1

## 2017-01-27 MED ORDER — DEXAMETHASONE 4 MG PO TABS: 4.0000 mg | ORAL_TABLET | Freq: Two times a day (BID) | ORAL | 0 refills | Status: DC

## 2017-01-27 MED ORDER — CEPHALEXIN 500 MG PO CAPS: 500.0000 mg | ORAL_CAPSULE | Freq: Four times a day (QID) | ORAL | 0 refills | Status: DC

## 2017-01-27 MED ORDER — TRIAMCINOLONE ACETONIDE 0.1 % EX CREA: 1.0000 "application " | TOPICAL_CREAM | Freq: Two times a day (BID) | CUTANEOUS | 0 refills | Status: DC

## 2017-01-27 MED ORDER — FLUCONAZOLE 150 MG PO TABS: 150.0000 mg | ORAL_TABLET | Freq: Every day | ORAL | 1 refills | Status: DC

## 2017-01-27 NOTE — Telephone Encounter (Signed)
Medication management - Called pt. back after she left a message she was in need of a refill of Pritiq.  Informed pt. a new order was sent to her [facility] in [place] on 01/16/17. Pt. to call back if any problems getting filled and she will reschedule needed new appt. with Dr. Michae Kava when in to see Idalia Needle for therapy on 01/28/17.

## 2017-01-27 NOTE — Discharge Instructions (Addendum)
Your neurologic examination is normal today. I believe your headaches may be related to a sinusitis or upper respiratory related illness. Your rash seems to have characteristics of an eczema. Please use the Decadron 2 times daily with food. Use the Keflex for possible secondary infection 4 times daily with food. Please apply the triamcinolone cream to all rash areas from the neck down. Please see Ms. Register for dermatology evaluation if not improving.
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
## 2017-01-27 NOTE — ED Triage Notes (Signed)
Patient c/o rash to hands, arms, back of neck, and forehead that started x1 month ago and is progressively getting worse. Per patient itching, using hydrocortisone cream with relief. Denies any fevers.

## 2017-01-28 ENCOUNTER — Ambulatory Visit (HOSPITAL_COMMUNITY): Payer: Self-pay | Admitting: Licensed Clinical Social Worker

## 2017-02-06 ENCOUNTER — Ambulatory Visit (INDEPENDENT_AMBULATORY_CARE_PROVIDER_SITE_OTHER): Payer: Self-pay | Admitting: Psychiatry

## 2017-02-06 ENCOUNTER — Encounter (HOSPITAL_COMMUNITY): Payer: Self-pay | Admitting: Psychiatry

## 2017-02-06 VITALS — BP 112/74 | HR 97 | Ht 65.0 in | Wt 110.0 lb

## 2017-02-06 DIAGNOSIS — Z87891 Personal history of nicotine dependence: Secondary | ICD-10-CM

## 2017-02-06 DIAGNOSIS — F4310 Post-traumatic stress disorder, unspecified: Secondary | ICD-10-CM

## 2017-02-06 DIAGNOSIS — F331 Major depressive disorder, recurrent, moderate: Secondary | ICD-10-CM

## 2017-02-06 DIAGNOSIS — G4700 Insomnia, unspecified: Secondary | ICD-10-CM

## 2017-02-06 DIAGNOSIS — F900 Attention-deficit hyperactivity disorder, predominantly inattentive type: Secondary | ICD-10-CM

## 2017-02-06 DIAGNOSIS — Z79899 Other long term (current) drug therapy: Secondary | ICD-10-CM

## 2017-02-06 MED ORDER — AMPHETAMINE-DEXTROAMPHET ER 10 MG PO CP24: 10.0000 mg | ORAL_CAPSULE | Freq: Every day | ORAL | 0 refills | Status: DC

## 2017-02-06 MED ORDER — VENLAFAXINE HCL ER 75 MG PO CP24: 75.0000 mg | ORAL_CAPSULE | Freq: Every day | ORAL | 1 refills | Status: DC

## 2017-02-06 NOTE — Progress Notes (Signed)
BH MD/PA/NP OP Progress Note  02/06/2017 2:22 PM Christine Douglas  MRN:  [id number]  Chief Complaint:  Chief Complaint    Follow-up     HPI: Pt reports she is having lower abdominal pain and incontinence. She needs a hysterectomy. Her insurance was denied and plans to get new insurance soon.  Pt's previous employer was closed down by the state. Pt was unemployed about one month. She got a new job one week ago.   Pt is buying 3-4 tabs of Pritsiq at at time due to finances. She has not taken Vyvanse in over a month because she can't afford it. As a result her concentration is poor.  Pt is not sleeping well at night. She is waking up multiple times a night but is having night sweats. Pt is having nighttime migraines and hand tingling. Pt is reports she is randomly having bad dreams. Pt states she is taking  of Melatonin and it helps some. Pt reports she is doing some hobbies. Depression is more situational and related to her mother. Pt denies SI/HI.  Pt is not happy with her previous therapist and feels they are not getting along well. Pt feels her therapist is not understanding the patient's issues or helping pt in an effective way.  Pt states-taking meds as prescribed and denies SE.   Visit Diagnosis:    ICD-10-CM   1. Moderate episode of recurrent major depressive disorder (HCC) F33.1 venlafaxine XR (EFFEXOR XR) 75 MG 24 hr capsule  2. Attention deficit hyperactivity disorder (ADHD), predominantly inattentive type F90.0 amphetamine-dextroamphetamine (ADDERALL XR) 10 MG 24 hr capsule       Past Psychiatric History:   Father has dementia and mother is handicap (lost a leg).She is bedridden."My mom runs the home from her bed.Pt reports one prior psychiatric hospitalization when she was age 59 due to difficulty coping with mother.Pt OD at age 67 due to mother remarrying.Has been seeing Dr. Arvella Nigh for twelve yrs and Dr. Lennette Bihari for ~ one yr.At age 25 saw her first  psychiatrist.Family Hx:Son (Bipolar); Deceased Brother (committed suicide at age 3).        Previous Psychotropic Medications: Yes Celexa 20 mg-ineffective;Concerta 36 mg .  Takes Valium 10 mg daily for muscle spasms due to allergy to muscle relaxers IE Flexaril etc, Seroquel-too sedating, Abilify-ineffective  Substance Abuse History in the last 12 months: No. History of alcohol / drug use?: No history of alcohol / drug abuse Reports that she has never smoked. She has never used smokeless tobacco. Consequences of Substance Abuse: NA  Past Medical History:  Past Medical History:  Diagnosis Date  . ADHD (attention deficit hyperactivity disorder)   . Anxiety   . BV (bacterial vaginosis)   . Depression   . Headache   . 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 Restless leg syndrome   . TO (tubal occlusion)     Past Surgical History:  Procedure Laterality Date  . ABDOMINAL HYSTERECTOMY    . BACK SURGERY     spine surgery; 2 screws and plate in neck  . CESAREAN SECTION    . SHOULDER ARTHROSCOPY WITH ROTATOR CUFF REPAIR AND SUBACROMIAL DECOMPRESSION  06/04/2012   Procedure: SHOULDER ARTHROSCOPY WITH ROTATOR CUFF REPAIR AND SUBACROMIAL DECOMPRESSION;  Surgeon: Loreta Ave, MD;  Location: [facility];  Service: Orthopedics;  Laterality: Left;  LEFT ARTHROSCOPY SHOULDER DECOMPRESSION SUBACROMIAL PARTIAL ACROMIOPLASTY WITH CORACOACROMIAL RELEASE, DISTAL CLAVICULECTOMY,  ROTATOR CUFF REPAIR  Family Psychiatric History: Family History  Problem Relation Age of Onset  . Hyperlipidemia Mother   . Thyroid disease Mother   . Other Brother        suicide  . Bipolar disorder Son   . Cancer Other     Social History:  Social History   Social History  . Marital status: Divorced    Spouse name: N/A  . Number of children: N/A  . Years of education: 52   Social History Main Topics  . Smoking status: Former Smoker    Types: Cigarettes  .  Smokeless tobacco: Never Used  . Alcohol use Yes     Comment: wine occassionally  . Drug use: No  . Sexual activity: Not Currently    Birth control/ protection: Surgical     Comment: hyst   Other Topics Concern  . Not on file   Social History Narrative   Drinks 1 cup of coffee a day     Allergies:  Allergies  Allergen Reactions  . Anesthetics, Amide Anaphylaxis    Neck surgery pt unsure of exact anesthetic-afraid to have any further surgeries; Feels weird for a while  . Flexeril [Cyclobenzaprine] Swelling    Cannot take muscle relaxers -- swelling of legs and body     Metabolic Disorder Labs: No results found for: HGBA1C, MPG No results found for: PROLACTIN No results found for: CHOL, TRIG, HDL, CHOLHDL, VLDL, LDLCALC No results found for: TSH  Therapeutic Level Labs: No results found for: LITHIUM No results found for: VALPROATE No components found for:  CBMZ  Current Medications: Current Outpatient Prescriptions  Medication Sig Dispense Refill  . cephALEXin (KEFLEX) 500 MG capsule Take 1 capsule (500 mg total) by mouth 4 (four) times daily. 20 capsule 0  . desvenlafaxine (PRISTIQ) 100 MG 24 hr tablet TAKE 1 TABLET BY MOUTH EVERY DAY 30 tablet 0  . dexamethasone (DECADRON) 4 MG tablet Take 1 tablet (4 mg total) by mouth 2 (two) times daily with a meal. 12 tablet 0  . diazepam (VALIUM) 10 MG tablet Take 10 mg by mouth as needed.   4  . fluconazole (DIFLUCAN) 150 MG tablet Take 1 tablet (150 mg total) by mouth daily. 1 tablet 1  . triamcinolone cream (KENALOG) 0.1 % Apply 1 application topically 2 (two) times daily. 30 g 0  . VYVANSE 50 MG capsule Take 50 mg by mouth daily.  0   No current facility-administered medications for this visit.      Musculoskeletal: Strength & Muscle Tone: within normal limits Gait & Station: normal Patient leans: N/A  Psychiatric Specialty Exam: Review of Systems  Neurological: Positive for tingling, sensory change and headaches.  Negative for dizziness and tremors.  Psychiatric/Behavioral: Positive for depression. Negative for hallucinations, substance abuse and suicidal ideas. The patient has insomnia. The patient is not nervous/anxious.     Blood pressure 112/74, pulse 97, height  (1.651 m), weight 110 lb (49.9 kg), SpO2 99 %.Body mass index is 18.3 kg/m.  General Appearance: Fairly Groomed  Eye Contact:  Good  Speech:  Clear and Coherent and Normal Rate  Volume:  Normal  Mood:  Depressed  Affect:  Congruent  Thought Process:  Coherent and Descriptions of Associations: Circumstantial  Orientation:  Full (Time, Place, and Person)  Thought Content: Logical   Suicidal Thoughts:  No  Homicidal Thoughts:  No  Memory:  Immediate;   Good Recent;   Good Remote;   Good  Judgement:  Good  Insight:  Good  Psychomotor Activity:  Normal  Concentration:  Concentration: Good and Attention Span: Good  Recall:  Good  Fund of Knowledge: Good  Language: Good  Akathisia:  No  Handed:  Right  AIMS (if indicated): not done  Assets:  Communication Skills Desire for Improvement  ADL's:  Intact  Cognition: WNL  Sleep:  Fair   Screenings: GAD-7     [facility]06[facility]  Total GAD-7 Score  16    PHQ2-9     Counselor from 11/01/2015 in [facility]  PHQ-2 Total Score  6  PHQ-9 Total Score  22       Assessment and Plan: MDD vs Bipolar disorder; PTSD; ADHD   Medication management with supportive therapy. Risks/benefits and SE of the medication discussed. Pt verbalized understanding and verbal consent obtained for treatment.  Affirm with the patient that the medications are taken as ordered. Patient expressed understanding of how their medications were to be used.   Meds: d/c Vyvanse  Start trial of Adderall XR  po qD for ADHD D/c Pristiq due to cost Start Effexor XR  po qD for depression and PTSD   Labs:  none  Therapy: brief supportive therapy provided. Discussed psychosocial stressors in detail.     Consultations: Encouraged to follow up with therapist Encouraged to follow up with PCP as needed  Pt denies SI and is at an acute low risk for suicide. Patient told to call clinic if any problems occur. Patient advised to go to ER if they should develop SI/HI, side effects, or if symptoms worsen. Has crisis numbers to call if needed. Pt verbalized understanding.  F/up in 2 months or sooner if needed    Oletta Darter, MD 02/06/2017, 2:22 PM

## 2017-02-11 ENCOUNTER — Ambulatory Visit (HOSPITAL_COMMUNITY): Payer: Self-pay | Admitting: Licensed Clinical Social Worker

## 2017-02-13 ENCOUNTER — Ambulatory Visit (HOSPITAL_COMMUNITY): Payer: Self-pay | Admitting: Psychiatry

## 2017-02-20 ENCOUNTER — Encounter (HOSPITAL_COMMUNITY): Payer: Self-pay | Admitting: Psychiatry

## 2017-02-20 ENCOUNTER — Ambulatory Visit (INDEPENDENT_AMBULATORY_CARE_PROVIDER_SITE_OTHER): Payer: Self-pay | Admitting: Psychiatry

## 2017-02-20 VITALS — BP 118/72 | HR 98 | Ht 64.0 in | Wt 111.8 lb

## 2017-02-20 DIAGNOSIS — Z810 Family history of intellectual disabilities: Secondary | ICD-10-CM

## 2017-02-20 DIAGNOSIS — F4310 Post-traumatic stress disorder, unspecified: Secondary | ICD-10-CM

## 2017-02-20 DIAGNOSIS — F419 Anxiety disorder, unspecified: Secondary | ICD-10-CM

## 2017-02-20 DIAGNOSIS — Z818 Family history of other mental and behavioral disorders: Secondary | ICD-10-CM

## 2017-02-20 DIAGNOSIS — F900 Attention-deficit hyperactivity disorder, predominantly inattentive type: Secondary | ICD-10-CM

## 2017-02-20 DIAGNOSIS — Z566 Other physical and mental strain related to work: Secondary | ICD-10-CM

## 2017-02-20 DIAGNOSIS — Z87891 Personal history of nicotine dependence: Secondary | ICD-10-CM

## 2017-02-20 DIAGNOSIS — R450 Nervousness: Secondary | ICD-10-CM

## 2017-02-20 DIAGNOSIS — F331 Major depressive disorder, recurrent, moderate: Secondary | ICD-10-CM

## 2017-02-20 MED ORDER — AMPHETAMINE-DEXTROAMPHET ER 15 MG PO CP24: 15.0000 mg | ORAL_CAPSULE | Freq: Every day | ORAL | 0 refills | Status: DC

## 2017-02-20 MED ORDER — VENLAFAXINE HCL ER 150 MG PO CP24: 150.0000 mg | ORAL_CAPSULE | Freq: Every day | ORAL | 1 refills | Status: DC

## 2017-02-20 NOTE — Progress Notes (Signed)
BH MD/PA/NP OP Progress Note  02/20/2017 1:43 PM Christine BucklerLesa Douglas  MRN:  [id number]  Chief Complaint:  Chief Complaint    Follow-up     HPI: Pt states she is doing well. She feels Adderall and Effexor are working well together. Pt states-taking meds as prescribed and denies SE.   Pt is taking Adderall in the morning. She is able to eat and sleep with it. It helps her focus until about 2pm. After that she is not as productive at work. That caused some issues at work. Pt has trouble with getting up and getting to work on time. She feels triggered to anxiety at work by patient's telling her what to do. Pt is having intrusive memories. Pt avoids crowds at places like at [employer]. It makes her not want to go to work. Anxiety at work is high but ok at home. She has set some boundaries at home which is helping. Pt has racing thoughts before bed. She will meditate and pray. Pt is getting about 6 hrs/night.   Pt reports some depression. She has low energy and motivation at home. She does feel the Effexor is helping some. Pt denies SI/HI.  Pt denies manic and hypomanic symptoms including periods of decreased need for sleep, increased energy, mood lability, impulsivity, FOI, and excessive spending.   Visit Diagnosis:    ICD-10-CM   1. Attention deficit hyperactivity disorder (ADHD), predominantly inattentive type F90.0   2. Moderate episode of recurrent major depressive disorder (HCC) F33.1       Past Psychiatric History:  Father has dementia and mother is handicap (lost a leg).She is bedridden."My mom runs the home from her bed.Pt reports one prior psychiatric hospitalization when she was age 56 due to difficulty coping with mother.Pt OD at age 56 due to mother remarrying.Has been seeing Dr. Arvella NighVeta Bland for twelve yrs and Dr. Lennette BihariFunderburk for ~ one yr.At age 89 saw her first psychiatrist.Family Hx:Son (Bipolar); Deceased Brother (committed suicide at age 54).        Previous  Psychotropic Medications: Yes Celexa 20 mg-ineffective;Concerta 36 mg .  Takes Valium 10 mg daily for muscle spasms due to allergy to muscle relaxers IE Flexaril etc, Seroquel-too sedating, Abilify-ineffective  Substance Abuse History in the last 12 months: No. History of alcohol / drug use?: No history of alcohol / drug abuse Reports that she has never smoked. She has never used smokeless tobacco. Consequences of Substance Abuse: NA   Past Medical History:  Past Medical History:  Diagnosis Date  . ADHD (attention deficit hyperactivity disorder)   . Anxiety   . BV (bacterial vaginosis)   . Depression   . Headache   . 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 Restless leg syndrome   . TO (tubal occlusion)     Past Surgical History:  Procedure Laterality Date  . ABDOMINAL HYSTERECTOMY    . BACK SURGERY     spine surgery; 2 screws and plate in neck  . CESAREAN SECTION    . SHOULDER ARTHROSCOPY WITH ROTATOR CUFF REPAIR AND SUBACROMIAL DECOMPRESSION  06/04/2012   Procedure: SHOULDER ARTHROSCOPY WITH ROTATOR CUFF REPAIR AND SUBACROMIAL DECOMPRESSION;  Surgeon: Loreta Aveaniel F Murphy, MD;  Location: [facility];  Service: Orthopedics;  Laterality: Left;  LEFT ARTHROSCOPY SHOULDER DECOMPRESSION SUBACROMIAL PARTIAL ACROMIOPLASTY WITH CORACOACROMIAL RELEASE, DISTAL CLAVICULECTOMY,  ROTATOR CUFF REPAIR     Family Psychiatric History: Family History  Problem Relation Age of Onset  . Hyperlipidemia Mother   .  Thyroid disease Mother   . Other Brother        suicide  . Bipolar disorder Son   . Cancer Other   . Dementia Maternal Aunt     Social History:  Social History   Social History  . Marital status: Divorced    Spouse name: N/A  . Number of children: N/A  . Years of education: 54   Social History Main Topics  . Smoking status: Former Smoker    Types: Cigarettes  . Smokeless tobacco: Never Used  . Alcohol use 1.8 oz/week    3 Glasses of wine per  week     Comment: wine occassionally  . Drug use: No  . Sexual activity: Not Currently    Birth control/ protection: Surgical     Comment: hyst   Other Topics Concern  . None   Social History Narrative   Drinks 1 cup of coffee a day     Allergies:  Allergies  Allergen Reactions  . Anesthetics, Amide Anaphylaxis    Neck surgery pt unsure of exact anesthetic-afraid to have any further surgeries; Feels weird for a while  . Flexeril [Cyclobenzaprine] Swelling    Cannot take muscle relaxers -- swelling of legs and body     Metabolic Disorder Labs: No results found for: HGBA1C, MPG No results found for: PROLACTIN No results found for: CHOL, TRIG, HDL, CHOLHDL, VLDL, LDLCALC No results found for: TSH  Therapeutic Level Labs: No results found for: LITHIUM No results found for: VALPROATE No components found for:  CBMZ  Current Medications: Current Outpatient Prescriptions  Medication Sig Dispense Refill  . amphetamine-dextroamphetamine (ADDERALL XR) 10 MG 24 hr capsule Take 1 capsule (10 mg total) by mouth daily. 30 capsule 0  . cephALEXin (KEFLEX) 500 MG capsule Take 1 capsule (500 mg total) by mouth 4 (four) times daily. 20 capsule 0  . dexamethasone (DECADRON) 4 MG tablet Take 1 tablet (4 mg total) by mouth 2 (two) times daily with a meal. 12 tablet 0  . diazepam (VALIUM) 10 MG tablet Take 10 mg by mouth as needed.   4  . fluconazole (DIFLUCAN) 150 MG tablet Take 1 tablet (150 mg total) by mouth daily. 1 tablet 1  . triamcinolone cream (KENALOG) 0.1 % Apply 1 application topically 2 (two) times daily. 30 g 0  . venlafaxine XR (EFFEXOR XR) 75 MG 24 hr capsule Take 1 capsule (75 mg total) by mouth daily. 30 capsule 1   No current facility-administered medications for this visit.      Musculoskeletal: Strength & Muscle Tone: within normal limits Gait & Station: normal Patient leans: N/A  Psychiatric Specialty Exam: Review of Systems  Neurological: Negative for  dizziness, tingling, tremors, sensory change and headaches.  Psychiatric/Behavioral: Positive for depression. Negative for hallucinations, substance abuse and suicidal ideas. The patient is nervous/anxious. The patient does not have insomnia.     Blood pressure 118/72, pulse 98, height 5\' 4"  (1.626 m), weight 111 lb 12.8 oz (50.7 kg).Body mass index is 19.19 kg/m.  General Appearance: Fairly Groomed  Eye Contact:  Good  Speech:  Clear and Coherent and Normal Rate  Volume:  Normal  Mood:  Anxious and Depressed  Affect:  Congruent  Thought Process:  Coherent and Descriptions of Associations: Tangential  Orientation:  Full (Time, Place, and Person)  Thought Content: Logical   Suicidal Thoughts:  No  Homicidal Thoughts:  No  Memory:  Immediate;   Good Recent;   Good Remote;  Good  Judgement:  Fair  Insight:  Present  Psychomotor Activity:  Normal  Concentration:  Concentration: Fair and Attention Span: Fair  Recall:  Good  Fund of Knowledge: Fair  Language: Fair  Akathisia:  No  Handed:  Right  AIMS (if indicated): not done  Assets:  Communication Skills Desire for Improvement Housing Talents/Skills Transportation Vocational/Educational  ADL's:  Intact  Cognition: WNL  Sleep:  Good   Screenings: GAD-7     Office [facility] from 11/06/2015 in [facility]  Total GAD-7 Score  16    PHQ2-9     Counselor from 11/01/2015 in [facility]  PHQ-2 Total Score  6  PHQ-9 Total Score  22       Assessment and Plan: MDD vs Bipolar disorder; PTSD; ADHD   Medication management with supportive therapy. Risks/benefits and SE of the medication discussed. Pt verbalized understanding and verbal consent obtained for treatment.  Affirm with the patient that the medications are taken as ordered. Patient expressed understanding of how their medications were to be used.   Meds: increase Adderall XR to 15mg  po qD for ADHD Increase Effexor  XR 150mg  po qD for depression and PTSD   Labs: none  Therapy: brief supportive therapy provided. Discussed psychosocial stressors in detail.     Consultations:Encouraged to follow up with PCP as needed  Pt denies SI and is at an acute low risk for suicide. Patient told to call clinic if any problems occur. Patient advised to go to ER if they should develop SI/HI, side effects, or if symptoms worsen. Has crisis numbers to call if needed. Pt verbalized understanding.  F/up in 2 months or sooner if needed   Oletta Darter, MD 02/20/2017, 1:43 PM

## 2017-02-21 ENCOUNTER — Encounter (HOSPITAL_COMMUNITY): Payer: Self-pay | Admitting: Emergency Medicine

## 2017-02-21 ENCOUNTER — Emergency Department (HOSPITAL_COMMUNITY)
Admission: EM | Admit: 2017-02-21 | Discharge: 2017-02-21 | Disposition: A | Discharge status: Home / Self Care | Payer: Self-pay | Attending: Emergency Medicine | Admitting: Emergency Medicine

## 2017-02-21 DIAGNOSIS — R21 Rash and other nonspecific skin eruption: Secondary | ICD-10-CM

## 2017-02-21 DIAGNOSIS — Z87891 Personal history of nicotine dependence: Secondary | ICD-10-CM | POA: Insufficient documentation

## 2017-02-21 DIAGNOSIS — N3000 Acute cystitis without hematuria: Secondary | ICD-10-CM

## 2017-02-21 DIAGNOSIS — Z79899 Other long term (current) drug therapy: Secondary | ICD-10-CM | POA: Insufficient documentation

## 2017-02-21 LAB — URINALYSIS, ROUTINE W REFLEX MICROSCOPIC
BILIRUBIN URINE: NEGATIVE
Glucose, UA: NEGATIVE mg/dL
Hgb urine dipstick: NEGATIVE
KETONES UR: NEGATIVE mg/dL
NITRITE: NEGATIVE
PROTEIN: NEGATIVE mg/dL
Specific Gravity, Urine: 1.014 (ref 1.005–1.030)
pH: 7 (ref 5.0–8.0)

## 2017-02-21 MED ORDER — CEPHALEXIN 500 MG PO CAPS: 500.0000 mg | ORAL_CAPSULE | Freq: Four times a day (QID) | ORAL | 0 refills | Status: DC

## 2017-02-21 MED ORDER — PREDNISONE 20 MG PO TABS: 20.0000 mg | ORAL_TABLET | Freq: Every day | ORAL | 0 refills | Status: AC
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
## 2017-02-21 NOTE — ED Triage Notes (Addendum)
Patient complaining of rash to back of head x 1 month. Patient was treated here last month for same. After triage, patient states "I also need my urine checked because it has a very strong smell."

## 2017-02-21 NOTE — Discharge Instructions (Signed)
Take antibiotics as directed. Please take all of your antibiotics until finished.  Take prednisone as directed.   Follow-up with your primary care doctor as directed.   Follow-up with referred dermatologist if no improvement in symptoms.   Return the emergency Department for any worsening rash, fevers, redness/swelling in the area, abdominal pain, fevers or any other worsening or concerning symptoms.

## 2017-02-24 LAB — URINE CULTURE

## 2017-02-25 ENCOUNTER — Telehealth: Payer: Self-pay | Admitting: *Deleted

## 2017-02-25 ENCOUNTER — Ambulatory Visit (HOSPITAL_COMMUNITY): Payer: Self-pay | Admitting: Licensed Clinical Social Worker

## 2017-02-25 NOTE — Telephone Encounter (Signed)
Post ED Visit - Positive Culture Follow-up  Culture report reviewed by antimicrobial stewardship pharmacist:  []  Enzo BiNathan Batchelder, Pharm.D. []  Celedonio MiyamotoJeremy Frens, Pharm.D., BCPS AQ-ID []  Garvin FilaMike Maccia, Pharm.D., BCPS [x]  Georgina PillionElizabeth Martin, [address] BCPS []  LockwoodMinh Pham, [address]Ph[address]D., BCPS, AAHIVP []  Estella HuskMichelle Turner, Pharm.D., BCPS, AAHIVP []  Lysle Pearlachel Rumbarger, PharmD, BCPS []  Casilda Carlsaylor Stone, PharmD, BCPS []  Pollyann SamplesAndy Johnston, PharmD, BCPS  Positive urine culture Treated with Cephalexin, organism sensitive to the same and no further patient follow-up is required at this time.  Virl AxeRobertson, Chelcy [facility] Hospital[facility] 02/25/2017, 11:19 AM

## 2017-03-04 ENCOUNTER — Ambulatory Visit (HOSPITAL_COMMUNITY): Payer: Self-pay | Admitting: Licensed Clinical Social Worker

## 2017-03-11 ENCOUNTER — Ambulatory Visit (HOSPITAL_COMMUNITY): Payer: Self-pay | Admitting: Licensed Clinical Social Worker

## 2017-03-17 ENCOUNTER — Other Ambulatory Visit (HOSPITAL_COMMUNITY): Payer: Self-pay | Admitting: Psychiatry

## 2017-03-17 ENCOUNTER — Telehealth (HOSPITAL_COMMUNITY): Payer: Self-pay

## 2017-03-17 MED ORDER — DIAZEPAM 10 MG PO TABS: 10.0000 mg | ORAL_TABLET | Freq: Three times a day (TID) | ORAL | 0 refills | Status: DC | PRN

## 2017-03-17 NOTE — Telephone Encounter (Signed)
Medication problem - Pt walked-in with complaint of itching and obvious skin rash on both hip and gluteal areas, arms, and neck. Dr. Susy ManorExsir evaluated and stopped Effexor XR. Provide 10 day Valium prescription and pt. to return 03/20/17 with Dr. Michae KavaAgarwal,

## 2017-03-17 NOTE — Progress Notes (Signed)
Patient came to clinic with drug rash that started approximately 3 weeks ago, on her upper thigh, under-arm, top of forehead.  Very itchy. No other environmental exposures recently. No airway compromise.  Rash started 3 weeks ago, which is approximately 5-7 days after switching to Effexor.  I instructed the patient to discontinue, and educated her on the withdrawal from SNRI.  Renewed Valium 10 mg 3 times a day for anxiety, and she will be following up this week with her primary psychiatrist as scheduled.    -Added Effexor to allergy list -valium 10 mg TID for 10 days -follow-up with Dr. Michae KavaAgarwal this Thursday as scheduled

## 2017-03-18 ENCOUNTER — Ambulatory Visit (HOSPITAL_COMMUNITY): Payer: Self-pay | Admitting: Licensed Clinical Social Worker

## 2017-03-20 ENCOUNTER — Telehealth (HOSPITAL_COMMUNITY): Payer: Self-pay

## 2017-03-20 ENCOUNTER — Ambulatory Visit (HOSPITAL_COMMUNITY): Payer: Self-pay | Admitting: Psychiatry

## 2017-03-20 NOTE — Telephone Encounter (Signed)
Called and left voice message asking for patient to call clinic when convenient.

## 2017-03-20 NOTE — Telephone Encounter (Signed)
Due to the weather, patient was too anxious to drive here today. She would like to know what to do since she had to stop the Effexor dur to a rash. Please review and advise, thank you

## 2017-03-21 ENCOUNTER — Telehealth (HOSPITAL_COMMUNITY): Payer: Self-pay | Admitting: Psychiatry

## 2017-03-23 ENCOUNTER — Encounter (HOSPITAL_COMMUNITY): Payer: Self-pay | Admitting: Emergency Medicine

## 2017-03-23 ENCOUNTER — Emergency Department (HOSPITAL_COMMUNITY)
Admission: EM | Admit: 2017-03-23 | Discharge: 2017-03-23 | Disposition: A | Discharge status: Home / Self Care | Payer: Self-pay | Attending: Emergency Medicine | Admitting: Emergency Medicine

## 2017-03-23 DIAGNOSIS — Z87891 Personal history of nicotine dependence: Secondary | ICD-10-CM | POA: Insufficient documentation

## 2017-03-23 DIAGNOSIS — R21 Rash and other nonspecific skin eruption: Secondary | ICD-10-CM | POA: Insufficient documentation

## 2017-03-23 DIAGNOSIS — Z79899 Other long term (current) drug therapy: Secondary | ICD-10-CM | POA: Insufficient documentation

## 2017-03-23 DIAGNOSIS — F909 Attention-deficit hyperactivity disorder, unspecified type: Secondary | ICD-10-CM | POA: Insufficient documentation

## 2017-03-23 MED ORDER — SELENIUM SULFIDE 2.3 % EX SHAM: 1.0000 "application " | MEDICATED_SHAMPOO | Freq: Every morning | CUTANEOUS | 0 refills | Status: DC

## 2017-03-23 MED ORDER — TRIAMCINOLONE ACETONIDE 0.1 % EX CREA: 1.0000 "application " | TOPICAL_CREAM | Freq: Two times a day (BID) | CUTANEOUS | 0 refills | Status: DC

## 2017-03-23 NOTE — ED Triage Notes (Signed)
Pt c/o rash x 3 week that is small red papules and described as itchy. Pt states " I feel like i'm itching from inside out" Pt also c/o of spider bite that occurred 5 mo ago to scalp but continues to have swelling of lymph node. 

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
## 2017-03-23 NOTE — Discharge Instructions (Signed)
Follow-up with your primary care doctor regarding further symptoms.  Follow-up with referred dermatology for further evaluation.  As we discussed, you can use Eucerin cream to help with itching.  You can also apply the triamcinolone cream for symptomatic relief.  Use the shampoo for symptoms on her scalp.  Return the emergency department if fever, worsening rash, open wounds, vomiting or any other worsening or concerning symptoms.

## 2017-03-25 ENCOUNTER — Ambulatory Visit (HOSPITAL_COMMUNITY): Payer: Self-pay | Admitting: Licensed Clinical Social Worker

## 2017-03-31 ENCOUNTER — Telehealth (HOSPITAL_COMMUNITY): Payer: Self-pay

## 2017-03-31 NOTE — Telephone Encounter (Signed)
Patient contacted her pharmacy for a refill on the Diazepam given to her by Dr. Rene KocherEksir on 11/13. I called the pharmacy and let them know that it was a one time, 10 day order and the patient will not be receiving any refills on it.

## 2017-04-01 ENCOUNTER — Ambulatory Visit (HOSPITAL_COMMUNITY): Payer: Self-pay | Admitting: Licensed Clinical Social Worker

## 2017-04-02 ENCOUNTER — Telehealth (HOSPITAL_COMMUNITY): Payer: Self-pay

## 2017-04-02 NOTE — Telephone Encounter (Signed)
Patient is calling  Because she has stopped all of her medications due to a rash that she had. Patient states that she still has the rash, but it moves around. She said that she is going to restart the Effexor because she is miserable and since she still has the rash she kows it was not the medication. She has been advised to see a dermatologist, but she currently has no insurance. Patient would like a call back, thank you

## 2017-04-03 ENCOUNTER — Encounter (HOSPITAL_COMMUNITY): Payer: Self-pay | Admitting: Psychiatry

## 2017-04-03 ENCOUNTER — Ambulatory Visit (INDEPENDENT_AMBULATORY_CARE_PROVIDER_SITE_OTHER): Payer: Self-pay | Admitting: Psychiatry

## 2017-04-03 DIAGNOSIS — F331 Major depressive disorder, recurrent, moderate: Secondary | ICD-10-CM

## 2017-04-03 DIAGNOSIS — F4310 Post-traumatic stress disorder, unspecified: Secondary | ICD-10-CM

## 2017-04-03 DIAGNOSIS — Z818 Family history of other mental and behavioral disorders: Secondary | ICD-10-CM

## 2017-04-03 DIAGNOSIS — Z87891 Personal history of nicotine dependence: Secondary | ICD-10-CM

## 2017-04-03 DIAGNOSIS — F900 Attention-deficit hyperactivity disorder, predominantly inattentive type: Secondary | ICD-10-CM

## 2017-04-03 MED ORDER — VENLAFAXINE HCL ER 150 MG PO CP24: 150.0000 mg | ORAL_CAPSULE | Freq: Every day | ORAL | 1 refills | Status: DC

## 2017-04-03 MED ORDER — AMPHETAMINE-DEXTROAMPHET ER 15 MG PO CP24: 15.0000 mg | ORAL_CAPSULE | Freq: Every day | ORAL | 0 refills | Status: DC

## 2017-04-03 NOTE — Progress Notes (Signed)
BH MD/PA/NP OP Progress Note  04/03/2017 3:04 PM Christine GaribaldiLesa Raptis  MRN:  [id number]  Chief Complaint:  Chief Complaint    Follow-up     HPI: Pt was bit by a spider in May which resulted in a node by her ear. Pt noticed diffuse rash that developed about 2 months ago. It would come and go. She came to the psych office concerned that it was due to Effexor. She stopped the Effexor for 3 weeks and noticed no improvement in her rashes. Pt went to her PCP who ordered a head CT but pt can not afford it. The rash continues. She states it is itchy and she had to cut her hair. Pt states she wakes 2-3 times a night due to itching. Pt is feeling depressed as it has been going on for 5 months. Pt is having crying spells. Pt denies SI/HI. Pt restarted Effexor yesterday and is tolerating it.   Pt fell 3 days ago due to excessive anxiety. Pt is having racing thoughts and is always thinking "what if".  Pt is having bad nightmares. She is having HV and intrusive memories from interactions with her mother.   Pt states-taking meds as prescribed and denies SE. Pt is not sure what effect the Effexor had due to the itching.   Visit Diagnosis:    ICD-10-CM   1. Attention deficit hyperactivity disorder (ADHD), predominantly inattentive type F90.0   2. Moderate episode of recurrent major depressive disorder (HCC) F33.1        Past Psychiatric History:  Father has dementia and mother is handicap (lost a leg).  She is bedridden.  "My mom runs the home from her bed.  Pt reports one prior psychiatric hospitalization when she was age 56 due to difficulty coping with mother.  Pt OD at age 56 due to mother remarrying.    Has been seeing Dr. Arvella NighVeta Bland for twelve yrs and Dr. Lennette BihariFunderburk for ~ one yr.  At age 89 saw her first psychiatrist.  Family Hx:  Son (Bipolar); Deceased Brother (committed suicide at age 54).          Previous Psychotropic Medications: Yes Celexa 20 mg-ineffective;Concerta 36 mg .  Takes Valium 10 mg daily  for muscle spasms due to allergy to muscle relaxers IE Flexaril etc, Seroquel-too sedating, Abilify-ineffective   Substance Abuse History in the last 12 months:  No. History of alcohol / drug use?: No history of alcohol / drug abuse Reports that she has never smoked. She has never used smokeless tobacco. Consequences of Substance Abuse: NA   Past Medical History:  Past Medical History:  Diagnosis Date  . ADHD (attention deficit hyperactivity disorder)   . Anxiety   . BV (bacterial vaginosis)   . Depression   . Headache   . 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 Restless leg syndrome   . TO (tubal occlusion)     Past Surgical History:  Procedure Laterality Date  . ABDOMINAL HYSTERECTOMY    . BACK SURGERY     spine surgery; 2 screws and plate in neck  . CESAREAN SECTION    . SHOULDER ARTHROSCOPY WITH ROTATOR CUFF REPAIR AND SUBACROMIAL DECOMPRESSION  06/04/2012   Procedure: SHOULDER ARTHROSCOPY WITH ROTATOR CUFF REPAIR AND SUBACROMIAL DECOMPRESSION;  Surgeon: Loreta Aveaniel F Murphy, MD;  Location: [facility];  Service: Orthopedics;  Laterality: Left;  LEFT ARTHROSCOPY SHOULDER DECOMPRESSION SUBACROMIAL PARTIAL ACROMIOPLASTY WITH CORACOACROMIAL RELEASE, DISTAL CLAVICULECTOMY,  ROTATOR CUFF REPAIR  Family Psychiatric History: Family History  Problem Relation Age of Onset  . Hyperlipidemia Mother   . Thyroid disease Mother   . Other Brother        suicide  . Bipolar disorder Son   . Cancer Other   . Dementia Maternal Aunt     Social History:  Social History   Socioeconomic History  . Marital status: Divorced    Spouse name: None  . Number of children: None  . Years of education: 19  . Highest education level: None  Social Needs  . Financial resource strain: None  . Food insecurity - worry: None  . Food insecurity - inability: None  . Transportation needs - medical: None  . Transportation needs - non-medical: None  Occupational History   . None  Tobacco Use  . Smoking status: Former Smoker    Types: Cigarettes  . Smokeless tobacco: Never Used  Substance and Sexual Activity  . Alcohol use: No    Alcohol/week: 0.0 oz    Frequency: Never    Comment: wine occassionally  . Drug use: No  . Sexual activity: Not Currently    Birth control/protection: Surgical    Comment: hyst  Other Topics Concern  . None  Social History Narrative   Drinks 1 cup of coffee a day     Allergies:  Allergies  Allergen Reactions  . Anesthetics, Amide Anaphylaxis    Neck surgery pt unsure of exact anesthetic-afraid to have any further surgeries; Feels weird for a while  . Flexeril [Cyclobenzaprine] Swelling    Cannot take muscle relaxers -- swelling of legs and body     Metabolic Disorder Labs: No results found for: HGBA1C, MPG No results found for: PROLACTIN No results found for: CHOL, TRIG, HDL, CHOLHDL, VLDL, LDLCALC No results found for: TSH  Therapeutic Level Labs: No results found for: LITHIUM No results found for: VALPROATE No components found for:  CBMZ  Current Medications: Current Outpatient Medications  Medication Sig Dispense Refill  . amphetamine-dextroamphetamine (ADDERALL XR) 15 MG 24 hr capsule Take 1 capsule by mouth daily. 30 capsule 0  . amphetamine-dextroamphetamine (ADDERALL XR) 15 MG 24 hr capsule Take 1 capsule by mouth daily. 30 capsule 0  . diazepam (VALIUM) 10 MG tablet Take 1 tablet (10 mg total) every 8 (eight) hours as needed by mouth for anxiety. 30 tablet 0  . triamcinolone cream (KENALOG) 0.1 % Apply 1 application 2 (two) times daily topically. 80 g 0  . cephALEXin (KEFLEX) 500 MG capsule Take 1 capsule (500 mg total) by mouth 4 (four) times daily. (Patient not taking: Reported on 04/03/2017) 28 capsule 0  . dexamethasone (DECADRON) 4 MG tablet Take 1 tablet (4 mg total) by mouth 2 (two) times daily with a meal. (Patient not taking: Reported on 04/03/2017) 12 tablet 0  . fluconazole (DIFLUCAN) 150  MG tablet Take 1 tablet (150 mg total) by mouth daily. (Patient not taking: Reported on 04/03/2017) 1 tablet 1  . Selenium Sulfide 2.3 % SHAM Apply 1 application every morning topically. Apply to scalp daily for symptomatic relief. (Patient not taking: Reported on 04/03/2017) 180 mL 0  . venlafaxine XR (EFFEXOR-XR) 150 MG 24 hr capsule Take 1 capsule (150 mg total) by mouth daily. (Patient not taking: Reported on 04/03/2017) 30 capsule 1   No current facility-administered medications for this visit.      Musculoskeletal: Strength & Muscle Tone: within normal limits Gait & Station: normal Patient leans: N/A  Psychiatric Specialty Exam: Review  of Systems  Skin: Positive for itching and rash.  Neurological: Positive for dizziness and headaches. Negative for tremors and sensory change.  Psychiatric/Behavioral: Positive for depression. Negative for hallucinations, substance abuse and suicidal ideas. The patient is nervous/anxious and has insomnia.     Blood pressure 112/76, pulse 99, height 5\' 4"  (1.626 m), weight 114 lb (51.7 kg), SpO2 97 %.Body mass index is 19.57 kg/m.  General Appearance: Fairly Groomed  Eye Contact:  Good  Speech:  Clear and Coherent and Normal Rate  Volume:  Normal  Mood:  Anxious and Depressed  Affect:  Congruent  Thought Process:  Coherent and Descriptions of Associations: Circumstantial  Orientation:  Full (Time, Place, and Person)  Thought Content: Rumination   Suicidal Thoughts:  No  Homicidal Thoughts:  No  Memory:  Immediate;   Good Recent;   Good Remote;   Good  Judgement:  Good  Insight:  Good  Psychomotor Activity:  Normal  Concentration:  Concentration: Good and Attention Span: Good  Recall:  Good  Fund of Knowledge: Good  Language: Good  Akathisia:  No  Handed:  Right  AIMS (if indicated): not done  Assets:  Communication Skills Desire for Improvement Financial Resources/Insurance Housing Transportation  ADL's:  Intact  Cognition: WNL   Sleep:  Good   Screenings: GAD-7     [facility]  Total GAD-7 Score  16    PHQ2-9     Counselor from 11[facility]01/2015 [facility]  PHQ-2 Total Score  6  PHQ-9 Total Score  22       Assessment and Plan: MDD vs Bipolar disorder; PTSD; ADHD    Medication management with supportive therapy. Risks/benefits and SE of the medication discussed. Pt verbalized understanding and verbal consent obtained for treatment.  Affirm with the patient that the medications are taken as ordered. Patient expressed understanding of how their medications were to be used.   Meds: Adderall XR 15mg  po qD for ADHD Restart Effexor XR 150mg  po qD for depression and PTSD Valium as prescribed by her PCP  Labs: none  Therapy: brief supportive therapy provided. Discussed psychosocial stressors in detail.     Consultations: Encouraged to follow up with PCP as needed  Pt denies SI and is at an acute low risk for suicide. Patient told to call clinic if any problems occur. Patient advised to go to ER if they should develop SI/HI, side effects, or if symptoms worsen. Has crisis numbers to call if needed. Pt verbalized understanding.  F/up in 2 months or sooner if needed    Oletta DarterSalina Xion Debruyne, MD 04/03/2017, 3:04 PM

## 2017-04-08 ENCOUNTER — Ambulatory Visit (HOSPITAL_COMMUNITY): Payer: Self-pay | Admitting: Licensed Clinical Social Worker

## 2017-04-10 ENCOUNTER — Ambulatory Visit (HOSPITAL_COMMUNITY): Payer: Self-pay | Admitting: Psychiatry

## 2017-04-15 ENCOUNTER — Ambulatory Visit (HOSPITAL_COMMUNITY): Payer: Self-pay | Admitting: Licensed Clinical Social Worker

## 2017-04-17 ENCOUNTER — Ambulatory Visit (HOSPITAL_COMMUNITY): Payer: Self-pay | Admitting: Psychiatry

## 2017-04-22 ENCOUNTER — Ambulatory Visit (HOSPITAL_COMMUNITY): Payer: Self-pay | Admitting: Licensed Clinical Social Worker

## 2017-05-12 ENCOUNTER — Telehealth (HOSPITAL_COMMUNITY): Payer: Self-pay

## 2017-05-12 NOTE — Telephone Encounter (Signed)
Patient is calling, she would like to know if you can prescribe the Valium, she lost her insurance and can not go back to Dr. Parke SimmersBland until she pays $300 - also patient said that the Vyvanse was working better for her, she would just like a lower dose - she said the Adderall is just not working. Please review and advise, thank you

## 2017-05-15 ENCOUNTER — Ambulatory Visit (INDEPENDENT_AMBULATORY_CARE_PROVIDER_SITE_OTHER): Payer: BLUE CROSS/BLUE SHIELD | Admitting: Psychiatry

## 2017-05-15 ENCOUNTER — Encounter (HOSPITAL_COMMUNITY): Payer: Self-pay | Admitting: Psychiatry

## 2017-05-15 VITALS — BP 126/86 | HR 92 | Ht 63.75 in | Wt 117.0 lb

## 2017-05-15 DIAGNOSIS — M2550 Pain in unspecified joint: Secondary | ICD-10-CM

## 2017-05-15 DIAGNOSIS — Z634 Disappearance and death of family member: Secondary | ICD-10-CM | POA: Diagnosis not present

## 2017-05-15 DIAGNOSIS — Z810 Family history of intellectual disabilities: Secondary | ICD-10-CM

## 2017-05-15 DIAGNOSIS — M542 Cervicalgia: Secondary | ICD-10-CM | POA: Diagnosis not present

## 2017-05-15 DIAGNOSIS — Z87891 Personal history of nicotine dependence: Secondary | ICD-10-CM

## 2017-05-15 DIAGNOSIS — Z818 Family history of other mental and behavioral disorders: Secondary | ICD-10-CM

## 2017-05-15 DIAGNOSIS — F4310 Post-traumatic stress disorder, unspecified: Secondary | ICD-10-CM | POA: Diagnosis not present

## 2017-05-15 DIAGNOSIS — R51 Headache: Secondary | ICD-10-CM

## 2017-05-15 DIAGNOSIS — F909 Attention-deficit hyperactivity disorder, unspecified type: Secondary | ICD-10-CM

## 2017-05-15 MED ORDER — DIAZEPAM 10 MG PO TABS: 10.0000 mg | ORAL_TABLET | Freq: Two times a day (BID) | ORAL | 0 refills | Status: DC | PRN

## 2017-05-15 MED ORDER — PRAZOSIN HCL 1 MG PO CAPS: 1.0000 mg | ORAL_CAPSULE | Freq: Every day | ORAL | 0 refills | Status: DC

## 2017-05-15 NOTE — Telephone Encounter (Signed)
Pt needs an appointment to discuss all these issues. How much Valium does she have left?

## 2017-05-15 NOTE — Telephone Encounter (Signed)
Patient is coming today at 4:30

## 2017-05-15 NOTE — Progress Notes (Signed)
BH MD/PA/NP OP Progress Note  05/15/2017 4:41 PM Christine Douglas  MRN:  [id number]  Chief Complaint:  Chief Complaint    Anxiety; Depression; Follow-up     HPI: Pt scheduled emergency appt today as her PCP will no longer fill her Valium due to outstanding balance. Pt was was given a 10 day supply of Valium by Dr. Rene Kocher.   "I am really broken right now. I want to be fixed. I am a fighter and I want to make things work".  PTSD is much worse. Pt can't leave her house and can't do much for her mom. Pt is very worried about her mom died and feels like her mom has to go a home. Pt has no help and is overwhelmed. Pt went to a funeral for a friend who died with PTSD earlier this week. It was a trigger for own PTSD. Pt has been out of Valium for 2 weeks. She has tried to stop it 4 times in the past. She has RLS and is now having symptoms and neck spasms. Pt states everything is blurry and she is not able to sleep. Pt is having HV and intrusive memories since the funeral. Pt denies SI/HI- "I love me".. Pt is very worried about Valium withdrawal. 3 days ago she was having "the sweats, ringing of the ears like in a tunnel, tingling in her hands, spasms in her legs".   Visit Diagnosis:    ICD-10-CM   1. PTSD (post-traumatic stress disorder) F43.10 diazepam (VALIUM) 10 MG tablet    prazosin (MINIPRESS) 1 MG capsule      Past Psychiatric History:  Father has dementia and mother is handicap (lost a leg).  She is bedridden.  "My mom runs the home from her bed.  Pt reports one prior psychiatric hospitalization when she was age 15 due to difficulty coping with mother.  Pt OD at age 76 due to mother remarrying.    Has been seeing Dr. Arvella Nigh for twelve yrs and Dr. Lennette Bihari for ~ one yr.  At age 28 saw her first psychiatrist.  Family Hx:  Son (Bipolar); Deceased Brother (committed suicide at age 79).          Previous Psychotropic Medications: Yes Celexa 20 mg-ineffective;Concerta 36 mg .  Takes Valium 10 mg  daily for muscle spasms due to allergy to muscle relaxers IE Flexaril etc, Seroquel-too sedating, Abilify-ineffective, Prestiq   Substance Abuse History in the last 12 months:  No. History of alcohol / drug use?: No history of alcohol / drug abuse Reports that she has never smoked. She has never used smokeless tobacco. Consequences of Substance Abuse: NA   Past Medical History:  Past Medical History:  Diagnosis Date  . ADHD (attention deficit hyperactivity disorder)   . Anxiety   . BV (bacterial vaginosis)   . Depression   . Headache   . 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 Restless leg syndrome   . TO (tubal occlusion)     Past Surgical History:  Procedure Laterality Date  . ABDOMINAL HYSTERECTOMY    . BACK SURGERY     spine surgery; 2 screws and plate in neck  . CESAREAN SECTION    . SHOULDER ARTHROSCOPY WITH ROTATOR CUFF REPAIR AND SUBACROMIAL DECOMPRESSION  06/04/2012   Procedure: SHOULDER ARTHROSCOPY WITH ROTATOR CUFF REPAIR AND SUBACROMIAL DECOMPRESSION;  Surgeon: Loreta Ave, MD;  Location: [facility];  Service: Orthopedics;  Laterality: Left;  LEFT  ARTHROSCOPY SHOULDER DECOMPRESSION SUBACROMIAL PARTIAL ACROMIOPLASTY WITH CORACOACROMIAL RELEASE, DISTAL CLAVICULECTOMY,  ROTATOR CUFF REPAIR     Family Psychiatric History:  Family History  Problem Relation Age of Onset  . Hyperlipidemia Mother   . Thyroid disease Mother   . Other Brother        suicide  . Bipolar disorder Son   . Cancer Other   . Dementia Maternal Aunt     Social History:  Social History   Socioeconomic History  . Marital status: Divorced    Spouse name: None  . Number of children: None  . Years of education: 33  . Highest education level: None  Social Needs  . Financial resource strain: None  . Food insecurity - worry: None  . Food insecurity - inability: None  . Transportation needs - medical: None  . Transportation needs - non-medical: None   Occupational History  . None  Tobacco Use  . Smoking status: Former Smoker    Types: Cigarettes  . Smokeless tobacco: Never Used  Substance and Sexual Activity  . Alcohol use: No    Alcohol/week: 0.0 oz    Frequency: Never    Comment: wine occassionally  . Drug use: No  . Sexual activity: Not Currently    Birth control/protection: Surgical    Comment: hyst  Other Topics Concern  . None  Social History Narrative   Drinks 1 cup of coffee a day     Allergies:  Allergies  Allergen Reactions  . Anesthetics, Amide Anaphylaxis    Neck surgery pt unsure of exact anesthetic-afraid to have any further surgeries; Feels weird for a while  . Flexeril [Cyclobenzaprine] Swelling    Cannot take muscle relaxers -- swelling of legs and body     Metabolic Disorder Labs: No results found for: HGBA1C, MPG No results found for: PROLACTIN No results found for: CHOL, TRIG, HDL, CHOLHDL, VLDL, LDLCALC No results found for: TSH  Therapeutic Level Labs: No results found for: LITHIUM No results found for: VALPROATE No components found for:  CBMZ  Current Medications: Current Outpatient Medications  Medication Sig Dispense Refill  . venlafaxine XR (EFFEXOR-XR) 150 MG 24 hr capsule Take 1 capsule (150 mg total) by mouth daily. 30 capsule 1  . cephALEXin (KEFLEX) 500 MG capsule Take 1 capsule (500 mg total) by mouth 4 (four) times daily. (Patient not taking: Reported on 04/03/2017) 28 capsule 0  . dexamethasone (DECADRON) 4 MG tablet Take 1 tablet (4 mg total) by mouth 2 (two) times daily with a meal. (Patient not taking: Reported on 04/03/2017) 12 tablet 0  . diazepam (VALIUM) 10 MG tablet Take 1 tablet (10 mg total) by mouth every 12 (twelve) hours as needed for anxiety. 60 tablet 0  . fluconazole (DIFLUCAN) 150 MG tablet Take 1 tablet (150 mg total) by mouth daily. (Patient not taking: Reported on 04/03/2017) 1 tablet 1  . prazosin (MINIPRESS) 1 MG capsule Take 1 capsule (1 mg total) by mouth  at bedtime. 30 capsule 0  . Selenium Sulfide 2.3 % SHAM Apply 1 application every morning topically. Apply to scalp daily for symptomatic relief. (Patient not taking: Reported on 04/03/2017) 180 mL 0  . triamcinolone cream (KENALOG) 0.1 % Apply 1 application 2 (two) times daily topically. (Patient not taking: Reported on 05/15/2017) 80 g 0   No current facility-administered medications for this visit.      Musculoskeletal: Strength & Muscle Tone: within normal limits Gait & Station: normal Patient leans: N/A  Psychiatric Specialty  Exam: Review of Systems  Musculoskeletal: Positive for joint pain and neck pain. Negative for falls.  Neurological: Positive for dizziness, tingling, sensory change and headaches. Negative for speech change.    Blood pressure 126/86, pulse 92, height 5' 3.75" (1.619 m), weight 117 lb (53.1 kg).Body mass index is 20.24 kg/m.  General Appearance: Well Groomed  Eye Contact:  Good  Speech:  Clear and Coherent and Normal Rate  Volume:  Normal  Mood:  Anxious  Affect:  Congruent  Thought Process:  Coherent and Descriptions of Associations: Circumstantial  Orientation:  Full (Time, Place, and Person)  Thought Content: Rumination   Suicidal Thoughts:  No  Homicidal Thoughts:  No  Memory:  Immediate;   Good Recent;   Good Remote;   Good  Judgement:  Good  Insight:  Good  Psychomotor Activity:  Normal  Concentration:  Concentration: Good and Attention Span: Good  Recall:  Good  Fund of Knowledge: Good  Language: Good  Akathisia:  No  Handed:  Right  AIMS (if indicated): not done  Assets:  Communication Skills Desire for Improvement Transportation Vocational/Educational  ADL's:  Intact  Cognition: WNL  Sleep:  Poor   Screenings: GAD-7     Office Visit from 11/06/2015 in [facility]  Total GAD-7 Score  16    PHQ2-9     Counselor from 11/01/2015 in [facility]  PHQ-2 Total Score  6   PHQ-9 Total Score  22       Assessment and Plan:  PTSD; MDD vs Bipolar disorder; ADHD; r/o Benzo dependence    Medication management with supportive therapy. Risks/benefits and SE of the medication discussed. Pt verbalized understanding and verbal consent obtained for treatment.  Affirm with the patient that the medications are taken as ordered. Patient expressed understanding of how their medications were to be used.   Meds: d/c Adderall as pt stopped taking it one month ago and her itching resolved. Pt would prefer to restart Vyvanse. She doesn't think she needs ADHD meds anymore Effexor XR 150 mg p.o. daily for major depressive disorder and PTSD  Valium 10 mg p.o. twice daily.  Plan to taper off in the next several months.  PCP is no longer prescribing this medication Start trial of Prazosin 1mg  po qHS for PTSD related symptoms   Labs: none  Therapy: brief supportive therapy provided. Discussed psychosocial stressors in detail.     Consultations:Encouraged to follow up with PCP as needed  Pt denies SI and is at an acute low risk for suicide. Patient told to call clinic if any problems occur. Patient advised to go to ER if they should develop SI/HI, side effects, or if symptoms worsen. Has crisis numbers to call if needed. Pt verbalized understanding.  F/up in 3 weeks or sooner if needed    Oletta DarterSalina Pallas Wahlert, MD 05/15/2017, 4:41 PM

## 2017-05-22 ENCOUNTER — Encounter: Payer: BLUE CROSS/BLUE SHIELD | Admitting: Obstetrics and Gynecology

## 2017-05-28 ENCOUNTER — Encounter: Payer: Self-pay | Admitting: Obstetrics and Gynecology

## 2017-05-28 ENCOUNTER — Ambulatory Visit: Payer: BLUE CROSS/BLUE SHIELD | Admitting: Obstetrics and Gynecology

## 2017-05-28 VITALS — BP 104/80 | HR 80 | Ht 64.0 in | Wt 122.8 lb

## 2017-05-28 DIAGNOSIS — R102 Pelvic and perineal pain: Secondary | ICD-10-CM

## 2017-05-28 DIAGNOSIS — N951 Menopausal and female climacteric states: Secondary | ICD-10-CM | POA: Diagnosis not present

## 2017-05-28 DIAGNOSIS — N3941 Urge incontinence: Secondary | ICD-10-CM | POA: Diagnosis not present

## 2017-05-28 DIAGNOSIS — N941 Unspecified dyspareunia: Secondary | ICD-10-CM | POA: Diagnosis not present

## 2017-05-28 DIAGNOSIS — Z9889 Other specified postprocedural states: Secondary | ICD-10-CM | POA: Diagnosis not present

## 2017-05-28 DIAGNOSIS — F319 Bipolar disorder, unspecified: Secondary | ICD-10-CM | POA: Diagnosis not present

## 2017-05-28 DIAGNOSIS — N7011 Chronic salpingitis: Secondary | ICD-10-CM
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
## 2017-05-29 LAB — FOLLICLE STIMULATING HORMONE: FSH: 81.6 m[IU]/mL

## 2017-06-03 ENCOUNTER — Other Ambulatory Visit: Payer: Self-pay | Admitting: Obstetrics and Gynecology

## 2017-06-03 NOTE — Patient Instructions (Signed)
Christine Douglas  06/03/2017     @PREFPERIOPPHARMACY @   Your procedure is scheduled on  06/10/2017 .  Report to [facility] at  850   A.M.  Call this number if you have problems the morning of surgery:  [phone]   Remember:  Do not eat food or drink liquids after midnight.  Take these medicines the morning of surgery with A SIP OF WATER  Valium, minipress, effexor.   Do not wear jewelry, make-up or nail polish.  Do not wear lotions, powders, or perfumes, or deodorant.  Do not shave 48 hours prior to surgery.  Men may shave face and neck.  Do not bring valuables to the hospital.  [facility] is not responsible for any belongings or valuables.  Contacts, dentures or bridgework may not be worn into surgery.  Leave your suitcase in the car.  After surgery it may be brought to your room.  For patients admitted to the hospital, discharge time will be determined by your treatment team.  Patients discharged the day of surgery will not be allowed to drive home.   Name and phone number of your driver:   family Special instructions:  None  Please read over the following fact sheets that you were given. Anesthesia Post-op Instructions and Care and Recovery After Surgery       Bilateral Salpingo-Oophorectomy Bilateral salpingo-oophorectomy is the surgical removal of both fallopian tubes and both ovaries. The ovaries are reproductive organs that produce eggs in women. The fallopian tubes allow eggs to move from the ovaries to the uterus. You may need this procedure if you:  Have had your uterus removed. This procedure is usually done after the uterus is removed.  Have cancer of the fallopian tubes or ovaries.  Have a high risk of cancer of the fallopian tubes or ovaries.  There are three different techniques that can be used for this procedure:  Open. One large incision will be made in your abdomen.  Laparoscopic. A thin, lighted tube with a small camera on the end  (laparoscope) will be used to help perform the procedure. The laparoscope will allow your surgeon to make several small incisions in the abdomen instead of one large incision.  Robot-assisted. A computer will be used to control surgical instruments that are attached to robotic arms. A laparoscope may also be used with this technique.  As a result of this procedure, you will become sterile (unable to become pregnant), and you will go into menopause (no longer able to have menstrual periods). You may develop symptoms of menopause such as hot flashes, night sweats, and mood changes. Your sex drive may also be affected. Tell a health care provider about:  Any allergies you have.  All medicines you are taking, including vitamins, herbs, eye drops, creams, and over-the-counter medicines.  Any problems you or family members have had with anesthetic medicines.  Any blood disorders you have.  Any surgeries you have had.  Any medical conditions you have.  Whether you are pregnant or may be pregnant. What are the risks? Generally, this is a safe procedure. However, problems may occur, including:  Infection.  Bleeding.  Allergic reactions to medicines.  Damage to other structures or organs.  Blood clots in the legs or lungs.  What happens before the procedure? Staying hydrated Follow instructions from your health care provider about hydration, which may include:  Up to 2 hours before the procedure - you may  continue to drink clear liquids, such as water, clear fruit juice, black coffee, and plain tea.  Eating and drinking restrictions Follow instructions from your health care provider about eating and drinking, which may include:  8 hours before the procedure - stop eating heavy meals or foods such as meat, fried foods, or fatty foods.  6 hours before the procedure - stop eating light meals or foods, such as toast or cereal.  6 hours before the procedure - stop drinking milk or  drinks that contain milk.  2 hours before the procedure - stop drinking clear liquids.  Medicines  Ask your health care provider about: ? Changing or stopping your regular medicines. This is especially important if you are taking diabetes medicines or blood thinners. ? Taking medicines such as aspirin and ibuprofen. These medicines can thin your blood. Do not take these medicines before your procedure if your health care provider instructs you not to.  You may be given antibiotic medicine to help prevent infection. General instructions  Do not smoke for at least 2 weeks before your procedure or as told by your health care provider.  You may have an exam or testing.  You may have a blood or urine sample taken.  Ask your health care provider how your surgical site will be marked or identified.  Plan to have someone take you home from the hospital.  If you will be going home right after the procedure, plan to have someone with you for 24 hours. What happens during the procedure?  To reduce your risk of infection: ? Your health care team will wash or sanitize their hands. ? Your skin will be washed with soap. ? Hair may be removed from the surgical area.  An IV tube will be inserted into one of your veins.  You will be given one or more of the following: ? A medicine to help you relax (sedative). ? A medicine to make you fall asleep (general anesthetic).  A thin tube (catheter) will be inserted through your urethra and into your bladder. The catheter drains urine during your procedure.  Depending on the type of surgery you are having, your surgeon will do one of the following: ? Make one incision in your abdomen (open surgery). ? Make two small incisions in your abdomen (laparoscopic surgery). The laparoscope will be passed through one incision, and surgical instruments will be passed through the other. ? Make several small incisions in your abdomen (robot-assisted surgery). A  laparoscope and other surgical instruments may be passed through the incisions.  Your fallopian tubes and ovaries will be cut away from the uterus and removed.  Your blood vessels will be clamped and tied to prevent too much bleeding.  The incision(s) in your abdomen will be closed with stitches (sutures) or staples.  A bandage (dressing) may be placed over your incision(s). The procedure may vary among health care providers and hospitals. What happens after the procedure?  Your blood pressure, heart rate, breathing rate, and blood oxygen level will be monitored until the medicines you were given have worn off.  You may continue to receive fluids and medicines through an IV tube.  You may continue to have a catheter draining your urine.  You may have to wear compression stockings. These stockings help to prevent blood clots and reduce swelling in your legs.  You will be given pain medicine as needed.  Do not drive for 24 hours if you received a sedative. Summary  Bilateral salpingo-oophorectomy  is a procedure to remove both fallopian tubes and both ovaries.  There are three different techniques that can be used for this procedure, including open, laparoscopic, and robotic. Talk with your health care provider about how your procedure will be done.  As a result of this procedure, you will become sterile and you will go into menopause.  Plan to have someone take you home from the hospital. This information is not intended to replace advice given to you by your health care provider. Make sure you discuss any questions you have with your health care provider. Document Released: 04/22/2005 Document Revised: 05/27/2016 Document Reviewed: 05/27/2016 Elsevier Interactive Patient Education  2018 [employer]  Bilateral Salpingo-Oophorectomy, Care After This sheet gives you information about how to care for yourself after your procedure. Your health care provider may also give you more  specific instructions. If you have problems or questions, contact your health care provider. What can I expect after the procedure? After the procedure, it is common to have:  Abdominal pain.  Some occasional vaginal bleeding (spotting).  Tiredness.  Symptoms of menopause, such as hot flashes, night sweats, or mood swings.  Follow these instructions at home: Incision care  Keep your incision area and your bandage (dressing) clean and dry.  Follow instructions from your health care provider about how to take care of your incision. Make sure you: ? Wash your hands with soap and water before you change your dressing. If soap and water are not available, use hand sanitizer. ? Change your dressing as told by your health care provider. ? Leave stitches (sutures), staples, skin glue, or adhesive strips in place. These skin closures may need to stay in place for 2 weeks or longer. If adhesive strip edges start to loosen and curl up, you may trim the loose edges. Do not remove adhesive strips completely unless your health care provider tells you to do that.  Check your incision area every day for signs of infection. Check for: ? Redness, swelling, or pain. ? Fluid or blood. ? Warmth. ? Pus or a bad smell. Activity  Do not drive or use heavy machinery while taking prescription pain medicine.  Do not drive for 24 hours if you received a medicine to help you relax (sedative) during your procedure.  Take frequent, short walks throughout the day. Rest when you get tired. Ask your health care provider what activities are safe for you.  Avoid activity that requires great effort. Also, avoid heavy lifting. Do not lift anything that is heavier than 10 lbs. (4.5 kg), or the limit that your health care provider tells you, until he or she says that it is safe to do so.  Do not douche, use tampons, or have sex until your health care provider approves. General instructions  To prevent or treat  constipation while you are taking prescription pain medicine, your health care provider may recommend that you: ? Drink enough fluid to keep your urine clear or pale yellow. ? Take over-the-counter or prescription medicines. ? Eat foods that are high in fiber, such as fresh fruits and vegetables, whole grains, and beans. ? Limit foods that are high in fat and processed sugars, such as fried and sweet foods.  Take over-the-counter and prescription medicines only as told by your health care provider.  Do not take baths, swim, or use a hot tub until your health care provider approves. Ask your health care provider if you can take showers. You may only be allowed to  take sponge baths for bathing.  Wear compression stockings as told by your health care provider. These stockings help to prevent blood clots and reduce swelling in your legs.  Keep all follow-up visits as told by your health care provider. This is important. Contact a health care provider if:  You have pain when you urinate.  You have pus or a bad smelling discharge coming from your vagina.  You have redness, swelling, or pain around your incision.  You have fluid or blood coming from your incision.  Your incision feels warm to the touch.  You have pus or a bad smell coming from your incision.  You have a fever.  Your incision starts to break open.  You have pain in the abdomen, and it gets worse or does not get better when you take medicine.  You develop a rash.  You develop nausea and vomiting.  You feel lightheaded. Get help right away if:  You develop pain in your chest or leg.  You become short of breath.  You faint.  You have increased bleeding from your vagina. Summary  After the procedure, it is common to have pain, bleeding in the vagina, and symptoms of menopause.  Follow instructions from your health care provider about how to take care of your incision.  Follow instructions from your health  care provider about activities and restrictions.  Check your incision every day for signs of infection and report any symptoms to your health care provider. This information is not intended to replace advice given to you by your health care provider. Make sure you discuss any questions you have with your health care provider. Document Released: 04/22/2005 Document Revised: 05/27/2016 Document Reviewed: 05/27/2016 Elsevier Interactive Patient Education  2018 [employer].  General Anesthesia, Adult General anesthesia is the use of medicines to make a person "go to sleep" (be unconscious) for a medical procedure. General anesthesia is often recommended when a procedure:  Is long.  Requires you to be still or in an unusual position.  Is major and can cause you to lose blood.  Is impossible to do without general anesthesia.  The medicines used for general anesthesia are called general anesthetics. In addition to making you sleep, the medicines:  Prevent pain.  Control your blood pressure.  Relax your muscles.  Tell a health care provider about:  Any allergies you have.  All medicines you are taking, including vitamins, herbs, eye drops, creams, and over-the-counter medicines.  Any problems you or family members have had with anesthetic medicines.  Types of anesthetics you have had in the past.  Any bleeding disorders you have.  Any surgeries you have had.  Any medical conditions you have.  Any history of heart or lung conditions, such as heart failure, sleep apnea, or chronic obstructive pulmonary disease (COPD).  Whether you are pregnant or may be pregnant.  Whether you use tobacco, alcohol, marijuana, or street drugs.  Any history of Financial [employer].  Any history of depression or anxiety. What are the risks? Generally, this is a safe procedure. However, problems may occur, including:  Allergic reaction to anesthetics.  Lung and heart problems.  Inhaling food  or liquids from your stomach into your lungs (aspiration).  Injury to nerves.  Waking up during your procedure and being unable to move (rare).  Extreme agitation or a state of mental confusion (delirium) when you wake up from the anesthetic.  Air in the bloodstream, which can lead to stroke.  These problems are  more likely to develop if you are having a major surgery or if you have an advanced medical condition. You can prevent some of these complications by answering all of your health care provider's questions thoroughly and by following all pre-procedure instructions. General anesthesia can cause side effects, including:  Nausea or vomiting  A sore throat from the breathing tube.  Feeling cold or shivery.  Feeling tired, washed out, or achy.  Sleepiness or drowsiness.  Confusion or agitation.  What happens before the procedure? Staying hydrated Follow instructions from your health care provider about hydration, which may include:  Up to 2 hours before the procedure - you may continue to drink clear liquids, such as water, clear fruit juice, black coffee, and plain tea.  Eating and drinking restrictions Follow instructions from your health care provider about eating and drinking, which may include:  8 hours before the procedure - stop eating heavy meals or foods such as meat, fried foods, or fatty foods.  6 hours before the procedure - stop eating light meals or foods, such as toast or cereal.  6 hours before the procedure - stop drinking milk or drinks that contain milk.  2 hours before the procedure - stop drinking clear liquids.  Medicines  Ask your health care provider about: ? Changing or stopping your regular medicines. This is especially important if you are taking diabetes medicines or blood thinners. ? Taking medicines such as aspirin and ibuprofen. These medicines can thin your blood. Do not take these medicines before your procedure if your health care  provider instructs you not to. ? Taking new dietary supplements or medicines. Do not take these during the week before your procedure unless your health care provider approves them.  If you are told to take a medicine or to continue taking a medicine on the day of the procedure, take the medicine with sips of water. General instructions   Ask if you will be going home the same day, the following day, or after a longer hospital stay. ? Plan to have someone take you home. ? Plan to have someone stay with you for the first 24 hours after you leave the hospital or clinic.  For 3-6 weeks before the procedure, try not to use any tobacco products, such as cigarettes, chewing tobacco, and e-cigarettes.  You may brush your teeth on the morning of the procedure, but make sure to spit out the toothpaste. What happens during the procedure?  You will be given anesthetics through a mask and through an IV tube in one of your veins.  You may receive medicine to help you relax (sedative).  As soon as you are asleep, a breathing tube may be used to help you breathe.  An anesthesia specialist will stay with you throughout the procedure. He or she will help keep you comfortable and safe by continuing to give you medicines and adjusting the amount of medicine that you get. He or she will also watch your blood pressure, pulse, and oxygen levels to make sure that the anesthetics do not cause any problems.  If a breathing tube was used to help you breathe, it will be removed before you wake up. The procedure may vary among health care providers and hospitals. What happens after the procedure?  You will wake up, often slowly, after the procedure is complete, usually in a recovery area.  Your blood pressure, heart rate, breathing rate, and blood oxygen level will be monitored until the medicines you were given  have worn off.  You may be given medicine to help you calm down if you feel anxious or  agitated.  If you will be going home the same day, your health care provider may check to make sure you can stand, drink, and urinate.  Your health care providers will treat your pain and side effects before you go home.  Do not drive for 24 hours if you received a sedative.  You may: ? Feel nauseous and vomit. ? Have a sore throat. ? Have mental slowness. ? Feel cold or shivery. ? Feel sleepy. ? Feel tired. ? Feel sore or achy, even in parts of your body where you did not have surgery. This information is not intended to replace advice given to you by your health care provider. Make sure you discuss any questions you have with your health care provider. Document Released: 07/30/2007 Document Revised: 10/03/2015 Document Reviewed: 04/06/2015 Elsevier Interactive Patient Education  2018 [employer]. General Anesthesia, Adult, Care After These instructions provide you with information about caring for yourself after your procedure. Your health care provider may also give you more specific instructions. Your treatment has been planned according to current medical practices, but problems sometimes occur. Call your health care provider if you have any problems or questions after your procedure. What can I expect after the procedure? After the procedure, it is common to have:  Vomiting.  A sore throat.  Mental slowness.  It is common to feel:  Nauseous.  Cold or shivery.  Sleepy.  Tired.  Sore or achy, even in parts of your body where you did not have surgery.  Follow these instructions at home: For at least 24 hours after the procedure:  Do not: ? Participate in activities where you could fall or become injured. ? Drive. ? Use heavy machinery. ? Drink alcohol. ? Take sleeping pills or medicines that cause drowsiness. ? Make important decisions or sign legal documents. ? Take care of children on your own.  Rest. Eating and drinking  If you vomit, drink water,  juice, or soup when you can drink without vomiting.  Drink enough fluid to keep your urine clear or pale yellow.  Make sure you have little or no nausea before eating solid foods.  Follow the diet recommended by your health care provider. General instructions  Have a responsible adult stay with you until you are awake and alert.  Return to your normal activities as told by your health care provider. Ask your health care provider what activities are safe for you.  Take over-the-counter and prescription medicines only as told by your health care provider.  If you smoke, do not smoke without supervision.  Keep all follow-up visits as told by your health care provider. This is important. Contact a health care provider if:  You continue to have nausea or vomiting at home, and medicines are not helpful.  You cannot drink fluids or start eating again.  You cannot urinate after 8-12 hours.  You develop a skin rash.  You have fever.  You have increasing redness at the site of your procedure. Get help right away if:  You have difficulty breathing.  You have chest pain.  You have unexpected bleeding.  You feel that you are having a life-threatening or urgent problem. This information is not intended to replace advice given to you by your health care provider. Make sure you discuss any questions you have with your health care provider. Document Released: 07/29/2000 Document Revised: 09/25/2015  Document Reviewed: 04/06/2015 Elsevier Interactive Patient Education  [employer].

## 2017-06-05 ENCOUNTER — Encounter (HOSPITAL_COMMUNITY): Payer: Self-pay | Admitting: *Deleted

## 2017-06-05 ENCOUNTER — Encounter (HOSPITAL_COMMUNITY)
Admission: RE | Admit: 2017-06-05 | Discharge: 2017-06-05 | Disposition: A | Discharge status: Home / Self Care | Payer: BLUE CROSS/BLUE SHIELD | Source: Ambulatory Visit | Attending: Obstetrics and Gynecology | Admitting: Obstetrics and Gynecology

## 2017-06-05 ENCOUNTER — Other Ambulatory Visit: Payer: Self-pay

## 2017-06-05 ENCOUNTER — Encounter (HOSPITAL_COMMUNITY): Payer: Self-pay | Admitting: Psychiatry

## 2017-06-05 ENCOUNTER — Ambulatory Visit (INDEPENDENT_AMBULATORY_CARE_PROVIDER_SITE_OTHER): Payer: BLUE CROSS/BLUE SHIELD | Admitting: Psychiatry

## 2017-06-05 DIAGNOSIS — Z87891 Personal history of nicotine dependence: Secondary | ICD-10-CM | POA: Diagnosis not present

## 2017-06-05 DIAGNOSIS — Z810 Family history of intellectual disabilities: Secondary | ICD-10-CM | POA: Diagnosis not present

## 2017-06-05 DIAGNOSIS — N9419 Other specified dyspareunia: Secondary | ICD-10-CM | POA: Insufficient documentation

## 2017-06-05 DIAGNOSIS — Z818 Family history of other mental and behavioral disorders: Secondary | ICD-10-CM | POA: Diagnosis not present

## 2017-06-05 DIAGNOSIS — F410 Panic disorder [episodic paroxysmal anxiety] without agoraphobia: Secondary | ICD-10-CM

## 2017-06-05 DIAGNOSIS — F515 Nightmare disorder: Secondary | ICD-10-CM | POA: Diagnosis not present

## 2017-06-05 DIAGNOSIS — F331 Major depressive disorder, recurrent, moderate: Secondary | ICD-10-CM | POA: Diagnosis not present

## 2017-06-05 DIAGNOSIS — Z01812 Encounter for preprocedural laboratory examination: Secondary | ICD-10-CM | POA: Diagnosis not present

## 2017-06-05 DIAGNOSIS — Z636 Dependent relative needing care at home: Secondary | ICD-10-CM | POA: Diagnosis not present

## 2017-06-05 DIAGNOSIS — F4310 Post-traumatic stress disorder, unspecified: Secondary | ICD-10-CM

## 2017-06-05 LAB — BASIC METABOLIC PANEL
Anion gap: 9 (ref 5–15)
BUN: 8 mg/dL (ref 6–20)
CALCIUM: 8.8 mg/dL — AB (ref 8.9–10.3)
CO2: 25 mmol/L (ref 22–32)
Chloride: 103 mmol/L (ref 101–111)
Creatinine, Ser: 0.72 mg/dL (ref 0.44–1.00)
Glucose, Bld: 91 mg/dL (ref 65–99)
Potassium: 4.4 mmol/L (ref 3.5–5.1)
SODIUM: 137 mmol/L (ref 135–145)

## 2017-06-05 LAB — CBC
HCT: 40.7 % (ref 36.0–46.0)
Hemoglobin: 13 g/dL (ref 12.0–15.0)
MCH: 29.3 pg (ref 26.0–34.0)
MCHC: 31.9 g/dL (ref 30.0–36.0)
MCV: 91.7 fL (ref 78.0–100.0)
PLATELETS: 266 10*3/uL (ref 150–400)
RBC: 4.44 MIL/uL (ref 3.87–5.11)
RDW: 12.3 % (ref 11.5–15.5)
WBC: 8 10*3/uL (ref 4.0–10.5)

## 2017-06-05 LAB — URINALYSIS, ROUTINE W REFLEX MICROSCOPIC
BILIRUBIN URINE: NEGATIVE
Glucose, UA: NEGATIVE mg/dL
HGB URINE DIPSTICK: NEGATIVE
Ketones, ur: NEGATIVE mg/dL
Leukocytes, UA: NEGATIVE
Nitrite: NEGATIVE
Protein, ur: NEGATIVE mg/dL
Specific Gravity, Urine: 1.012 (ref 1.005–1.030)
pH: 6 (ref 5.0–8.0)

## 2017-06-05 MED ORDER — PRAZOSIN HCL 2 MG PO CAPS: 2.0000 mg | ORAL_CAPSULE | Freq: Every day | ORAL | 1 refills | Status: DC

## 2017-06-05 MED ORDER — DIAZEPAM 10 MG PO TABS: 10.0000 mg | ORAL_TABLET | Freq: Two times a day (BID) | ORAL | 0 refills | Status: DC | PRN

## 2017-06-05 MED ORDER — VENLAFAXINE HCL ER 150 MG PO CP24: 150.0000 mg | ORAL_CAPSULE | Freq: Every day | ORAL | 1 refills | Status: DC

## 2017-06-05 NOTE — Progress Notes (Signed)
BH MD/PA/NP OP Progress Note  06/05/2017 1:47 PM Christine Douglas  MRN:  [id number]  Chief Complaint:  Chief Complaint    Anxiety; Follow-up     HPI: Pt is having surgery on Tuesday. She is having full hysterectomy and is getting prepped today.  Pt is having random nightmares almost every night.  She will wake up 1-2x/night in a slightly panic state. The Prazosin is really helping.  Anxiety during the day varies depending on stressors. Pt is handling her stress better. Pt is taking Valium BID and it helps. Overall she is feeling much better.   Depression comes and goes and is currently is mild. She is exercising and eating well. Pt denies SI/HI.  Pt states-taking meds as prescribed and denies SE.    Visit Diagnosis:    ICD-10-CM   1. Moderate episode of recurrent major depressive disorder (HCC) F33.1 venlafaxine XR (EFFEXOR-XR) 150 MG 24 hr capsule  2. PTSD (post-traumatic stress disorder) F43.10 diazepam (VALIUM) 10 MG tablet    prazosin (MINIPRESS) 2 MG capsule      Past Psychiatric History:  Father has dementia and mother is handicap (lost a leg).  She is bedridden.  "My mom runs the home from her bed.  Pt reports one prior psychiatric hospitalization when she was age 53 due to difficulty coping with mother.  Pt OD at age 57 due to mother remarrying.    Has been seeing Dr. Arvella Nigh for twelve yrs and Dr. Lennette Bihari for ~ one yr.  At age 31 saw her first psychiatrist.  Family Hx:  Son (Bipolar); Deceased Brother (committed suicide at age 50).          Previous Psychotropic Medications: Yes Celexa 20 mg-ineffective;Concerta 36 mg .  Takes Valium 10 mg daily for muscle spasms due to allergy to muscle relaxers IE Flexaril etc, Seroquel-too sedating, Abilify-ineffective, Prestiq   Substance Abuse History in the last 12 months:  No. History of alcohol / drug use?: No history of alcohol / drug abuse Reports that she has never smoked. She has never used smokeless tobacco. Consequences  of Substance Abuse: NA   Past Medical History:  Past Medical History:  Diagnosis Date  . ADHD (attention deficit hyperactivity disorder)   . Anxiety   . BV (bacterial vaginosis)   . Depression   . Headache   . 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 Restless leg syndrome   . TO (tubal occlusion)     Past Surgical History:  Procedure Laterality Date  . ABDOMINAL HYSTERECTOMY    . BACK SURGERY     spine surgery; 2 screws and plate in neck  . CESAREAN SECTION    . SHOULDER ARTHROSCOPY WITH ROTATOR CUFF REPAIR AND SUBACROMIAL DECOMPRESSION  06/04/2012   Procedure: SHOULDER ARTHROSCOPY WITH ROTATOR CUFF REPAIR AND SUBACROMIAL DECOMPRESSION;  Surgeon: Loreta Ave, MD;  Location: [facility];  Service: Orthopedics;  Laterality: Left;  LEFT ARTHROSCOPY SHOULDER DECOMPRESSION SUBACROMIAL PARTIAL ACROMIOPLASTY WITH CORACOACROMIAL RELEASE, DISTAL CLAVICULECTOMY,  ROTATOR CUFF REPAIR     Family Psychiatric History:  Family History  Problem Relation Age of Onset  . Hyperlipidemia Mother   . Thyroid disease Mother   . Other Brother        suicide  . Bipolar disorder Son   . Cancer Other   . Dementia Maternal Aunt     Social History:  Social History   Socioeconomic History  . Marital status: Divorced  Spouse name: Not on file  . Number of children: Not on file  . Years of education: 114  . Highest education level: Not on file  Social Needs  . Financial resource strain: Not on file  . Food insecurity - worry: Not on file  . Food insecurity - inability: Not on file  . Transportation needs - medical: Not on file  . Transportation needs - non-medical: Not on file  Occupational History  . Not on file  Tobacco Use  . Smoking status: Former Smoker    Types: Cigarettes  . Smokeless tobacco: Never Used  Substance and Sexual Activity  . Alcohol use: No    Alcohol/week: 0.0 oz    Frequency: Never    Comment: wine occassionally  . Drug use: No   . Sexual activity: Not Currently    Birth control/protection: Surgical    Comment: hyst  Other Topics Concern  . Not on file  Social History Narrative   Drinks 1 cup of coffee a day     Allergies:  Allergies  Allergen Reactions  . Anesthetics, Amide Anaphylaxis    Neck surgery pt unsure of exact anesthetic-afraid to have any further surgeries; Feels weird for a while  . Flexeril [Cyclobenzaprine] Swelling    Cannot take muscle relaxers -- swelling of legs and body     Metabolic Disorder Labs: No results found for: HGBA1C, MPG No results found for: PROLACTIN No results found for: CHOL, TRIG, HDL, CHOLHDL, VLDL, LDLCALC No results found for: TSH  Therapeutic Level Labs: No results found for: LITHIUM No results found for: VALPROATE No components found for:  CBMZ  Current Medications: Current Outpatient Medications  Medication Sig Dispense Refill  . diazepam (VALIUM) 10 MG tablet Take 1 tablet (10 mg total) by mouth every 12 (twelve) hours as needed for anxiety. (Patient taking differently: Take 10 mg by mouth every 12 (twelve) hours as needed (for neck spasms). ) 60 tablet 0  . prazosin (MINIPRESS) 1 MG capsule Take 1 capsule (1 mg total) by mouth at bedtime. 30 capsule 0  . Selenium Sulfide 2.3 % SHAM Apply 1 application every morning topically. Apply to scalp daily for symptomatic relief. (Patient not taking: Reported on 04/03/2017) 180 mL 0  . venlafaxine XR (EFFEXOR-XR) 150 MG 24 hr capsule Take 1 capsule (150 mg total) by mouth daily. 30 capsule 1   No current facility-administered medications for this visit.      Musculoskeletal: Strength & Muscle Tone: within normal limits Gait & Station: normal Patient leans: N/A  Psychiatric Specialty Exam: Review of Systems  Constitutional: Negative for chills, fever and weight loss.  HENT: Negative for congestion, nosebleeds, sinus pain and sore throat.     There were no vitals taken for this visit.There is no height or  weight on file to calculate BMI.  General Appearance: Fairly Groomed  Eye Contact:  Good  Speech:  Clear and Coherent and Normal Rate  Volume:  Normal  Mood:  Euthymic  Affect:  Full Range  Thought Process:  Goal Directed and Descriptions of Associations: Intact  Orientation:  Full (Time, Place, and Person)  Thought Content: Logical   Suicidal Thoughts:  No  Homicidal Thoughts:  No  Memory:  Immediate;   Good Recent;   Good Remote;   Good  Judgement:  Good  Insight:  Good  Psychomotor Activity:  Normal  Concentration:  Concentration: Good and Attention Span: Good  Recall:  Good  Fund of Knowledge: Good  Language: Good  Akathisia:  No  Handed:  Right  AIMS (if indicated): not done  Assets:  Communication Skills Desire for Improvement Housing Leisure Time Social Support Talents/Skills Transportation  ADL's:  Intact  Cognition: WNL  Sleep:  Good   Screenings: GAD-7     Office Visit from 11/06/2015 in [facility]  Total GAD-7 Score  16    PHQ2-9     Counselor from [facility]01/2015 [facility]  PHQ-2 Total Score  6  PHQ-9 Total Score  22       Assessment and Plan: PTSD-ongoing symptoms; MDD vs Bipolar disorder- improved; ADHD; r/o Benzo dependence    Medication management with supportive therapy. Risks/benefits and SE of the medication discussed. Pt verbalized understanding and verbal consent obtained for treatment.  Affirm with the patient that the medications are taken as ordered. Patient expressed understanding of how their medications were to be used.    Meds: Increase Prazosin 2mg  po qHS for PTSD symptoms Effexor XR 150mg  po qD for MDD and PTSD Valium 10mg  po BID for anxiety. Plan to taper off in the future   Labs: none  Therapy: brief supportive therapy provided. Discussed psychosocial stressors in detail.     Consultations: Encouraged to follow up with PCP as needed  Pt denies SI and is at an  acute low risk for suicide. Patient told to call clinic if any problems occur. Patient advised to go to ER if they should develop SI/HI, side effects, or if symptoms worsen. Has crisis numbers to call if needed. Pt verbalized understanding.  F/up in 2 months or sooner if needed    Oletta Darter, MD 06/05/2017, 1:47 PM

## 2017-06-06 LAB — TYPE AND SCREEN
ABO/RH(D): O POS
Antibody Screen: NEGATIVE

## 2017-06-10 ENCOUNTER — Encounter (HOSPITAL_COMMUNITY): Payer: Self-pay | Admitting: *Deleted

## 2017-06-10 ENCOUNTER — Ambulatory Visit (HOSPITAL_COMMUNITY): Payer: BLUE CROSS/BLUE SHIELD | Admitting: Anesthesiology

## 2017-06-10 ENCOUNTER — Encounter (HOSPITAL_COMMUNITY)
Admission: RE | Disposition: A | Discharge status: Home / Self Care | Payer: Self-pay | Source: Ambulatory Visit | Attending: Obstetrics and Gynecology

## 2017-06-10 ENCOUNTER — Ambulatory Visit (HOSPITAL_COMMUNITY)
Admission: RE | Admit: 2017-06-10 | Discharge: 2017-06-10 | Disposition: A | Discharge status: Home / Self Care | Payer: BLUE CROSS/BLUE SHIELD | Source: Ambulatory Visit | Attending: Obstetrics and Gynecology | Admitting: Obstetrics and Gynecology

## 2017-06-10 DIAGNOSIS — G2581 Restless legs syndrome: Secondary | ICD-10-CM | POA: Insufficient documentation

## 2017-06-10 DIAGNOSIS — N83201 Unspecified ovarian cyst, right side: Secondary | ICD-10-CM | POA: Diagnosis not present

## 2017-06-10 DIAGNOSIS — N83202 Unspecified ovarian cyst, left side: Secondary | ICD-10-CM | POA: Diagnosis not present

## 2017-06-10 DIAGNOSIS — Z79899 Other long term (current) drug therapy: Secondary | ICD-10-CM | POA: Diagnosis not present

## 2017-06-10 DIAGNOSIS — G709 Myoneural disorder, unspecified: Secondary | ICD-10-CM | POA: Diagnosis not present

## 2017-06-10 DIAGNOSIS — Z87891 Personal history of nicotine dependence: Secondary | ICD-10-CM | POA: Diagnosis not present

## 2017-06-10 DIAGNOSIS — Z809 Family history of malignant neoplasm, unspecified: Secondary | ICD-10-CM | POA: Insufficient documentation

## 2017-06-10 DIAGNOSIS — F419 Anxiety disorder, unspecified: Secondary | ICD-10-CM | POA: Diagnosis not present

## 2017-06-10 DIAGNOSIS — N941 Unspecified dyspareunia: Secondary | ICD-10-CM | POA: Diagnosis not present

## 2017-06-10 DIAGNOSIS — Z818 Family history of other mental and behavioral disorders: Secondary | ICD-10-CM | POA: Diagnosis not present

## 2017-06-10 DIAGNOSIS — F909 Attention-deficit hyperactivity disorder, unspecified type: Secondary | ICD-10-CM | POA: Diagnosis not present

## 2017-06-10 DIAGNOSIS — N7011 Chronic salpingitis: Secondary | ICD-10-CM | POA: Diagnosis not present

## 2017-06-10 DIAGNOSIS — Z8349 Family history of other endocrine, nutritional and metabolic diseases: Secondary | ICD-10-CM | POA: Insufficient documentation

## 2017-06-10 DIAGNOSIS — Z90710 Acquired absence of both cervix and uterus: Secondary | ICD-10-CM | POA: Insufficient documentation

## 2017-06-10 DIAGNOSIS — Z83438 Family history of other disorder of lipoprotein metabolism and other lipidemia: Secondary | ICD-10-CM | POA: Diagnosis not present

## 2017-06-10 DIAGNOSIS — N9412 Deep dyspareunia: Secondary | ICD-10-CM | POA: Diagnosis present

## 2017-06-10 DIAGNOSIS — Z888 Allergy status to other drugs, medicaments and biological substances status: Secondary | ICD-10-CM | POA: Diagnosis not present

## 2017-06-10 DIAGNOSIS — N763 Subacute and chronic vulvitis: Secondary | ICD-10-CM | POA: Diagnosis not present

## 2017-06-10 DIAGNOSIS — F329 Major depressive disorder, single episode, unspecified: Secondary | ICD-10-CM | POA: Insufficient documentation

## 2017-06-10 DIAGNOSIS — N736 Female pelvic peritoneal adhesions (postinfective): Secondary | ICD-10-CM | POA: Diagnosis present

## 2017-06-10 DIAGNOSIS — Z884 Allergy status to anesthetic agent status: Secondary | ICD-10-CM | POA: Insufficient documentation

## 2017-06-10 HISTORY — PX: LAPAROSCOPIC BILATERAL SALPINGO OOPHERECTOMY: SHX5890

## 2017-06-10 SURGERY — SALPINGO-OOPHORECTOMY, BILATERAL, LAPAROSCOPIC
Anesthesia: General | Laterality: Bilateral

## 2017-06-10 MED ORDER — KETOROLAC TROMETHAMINE 30 MG/ML IJ SOLN
30.0000 mg | Freq: Once | INTRAMUSCULAR | Status: AC
Start: 2017-06-10 — End: 2017-06-10
  Administered 2017-06-10: 30 mg via INTRAVENOUS

## 2017-06-10 MED ORDER — FENTANYL CITRATE (PF) 100 MCG/2ML IJ SOLN
INTRAMUSCULAR | Status: DC | PRN
  Administered 2017-06-10: 50 ug via INTRAVENOUS
  Administered 2017-06-10: 100 ug via INTRAVENOUS
  Administered 2017-06-10: 50 ug via INTRAVENOUS

## 2017-06-10 MED ORDER — FENTANYL CITRATE (PF) 100 MCG/2ML IJ SOLN
INTRAMUSCULAR | Status: AC
  Filled 2017-06-10: qty 2

## 2017-06-10 MED ORDER — DEXAMETHASONE SODIUM PHOSPHATE 4 MG/ML IJ SOLN
INTRAMUSCULAR | Status: DC | PRN
  Administered 2017-06-10: 4 mg via INTRAVENOUS

## 2017-06-10 MED ORDER — OXYCODONE-ACETAMINOPHEN 5-325 MG PO TABS: 1.0000 | ORAL_TABLET | ORAL | 0 refills | Status: DC | PRN

## 2017-06-10 MED ORDER — NEOSTIGMINE METHYLSULFATE 10 MG/10ML IV SOLN
INTRAVENOUS | Status: DC | PRN
  Administered 2017-06-10: 2 mg via INTRAVENOUS

## 2017-06-10 MED ORDER — LACTATED RINGERS IV SOLN
INTRAVENOUS | Status: DC
  Administered 2017-06-10 (×2): via INTRAVENOUS

## 2017-06-10 MED ORDER — BUPIVACAINE-EPINEPHRINE (PF) 0.5% -1:200000 IJ SOLN
INTRAMUSCULAR | Status: AC
  Filled 2017-06-10: qty 30

## 2017-06-10 MED ORDER — 0.9 % SODIUM CHLORIDE (POUR BTL) OPTIME
TOPICAL | Status: DC | PRN
  Administered 2017-06-10: 1000 mL

## 2017-06-10 MED ORDER — ONDANSETRON HCL 4 MG/2ML IJ SOLN
4.0000 mg | Freq: Once | INTRAMUSCULAR | Status: AC
  Administered 2017-06-10: 4 mg via INTRAVENOUS

## 2017-06-10 MED ORDER — DEXAMETHASONE SODIUM PHOSPHATE 4 MG/ML IJ SOLN
INTRAMUSCULAR | Status: AC
  Filled 2017-06-10: qty 1

## 2017-06-10 MED ORDER — DEXAMETHASONE SODIUM PHOSPHATE 4 MG/ML IJ SOLN
4.0000 mg | Freq: Once | INTRAMUSCULAR | Status: AC
  Administered 2017-06-10: 4 mg via INTRAVENOUS

## 2017-06-10 MED ORDER — GLYCOPYRROLATE 0.2 MG/ML IJ SOLN
INTRAMUSCULAR | Status: DC | PRN
  Administered 2017-06-10 (×2): 0.2 mg via INTRAVENOUS

## 2017-06-10 MED ORDER — FENTANYL CITRATE (PF) 100 MCG/2ML IJ SOLN
25.0000 ug | INTRAMUSCULAR | Status: DC | PRN
  Administered 2017-06-10 (×2): 50 ug via INTRAVENOUS
  Filled 2017-06-10: qty 2

## 2017-06-10 MED ORDER — KETOROLAC TROMETHAMINE 10 MG PO TABS: 10.0000 mg | ORAL_TABLET | Freq: Four times a day (QID) | ORAL | 0 refills | Status: DC | PRN

## 2017-06-10 MED ORDER — PROPOFOL 10 MG/ML IV BOLUS
INTRAVENOUS | Status: DC | PRN
  Administered 2017-06-10: 170 mg via INTRAVENOUS

## 2017-06-10 MED ORDER — KETOROLAC TROMETHAMINE 30 MG/ML IJ SOLN
INTRAMUSCULAR | Status: AC
  Filled 2017-06-10: qty 1

## 2017-06-10 MED ORDER — MIDAZOLAM HCL 2 MG/2ML IJ SOLN
INTRAMUSCULAR | Status: AC
  Filled 2017-06-10: qty 2

## 2017-06-10 MED ORDER — ROCURONIUM BROMIDE 100 MG/10ML IV SOLN
INTRAVENOUS | Status: DC | PRN
  Administered 2017-06-10: 50 mg via INTRAVENOUS

## 2017-06-10 MED ORDER — SODIUM CHLORIDE 0.9 % IR SOLN
Status: DC | PRN
  Administered 2017-06-10: 3000 mL

## 2017-06-10 MED ORDER — GLYCOPYRROLATE 0.2 MG/ML IJ SOLN
INTRAMUSCULAR | Status: AC
  Filled 2017-06-10: qty 1

## 2017-06-10 MED ORDER — ONDANSETRON HCL 4 MG/2ML IJ SOLN
INTRAMUSCULAR | Status: AC
  Filled 2017-06-10: qty 2

## 2017-06-10 MED ORDER — MIDAZOLAM HCL 2 MG/2ML IJ SOLN
1.0000 mg | INTRAMUSCULAR | Status: AC
  Administered 2017-06-10: 2 mg via INTRAVENOUS

## 2017-06-10 MED ORDER — PROPOFOL 10 MG/ML IV BOLUS
INTRAVENOUS | Status: AC
  Filled 2017-06-10: qty 20

## 2017-06-10 MED ORDER — CEFAZOLIN SODIUM-DEXTROSE 2-4 GM/100ML-% IV SOLN
2.0000 g | INTRAVENOUS | Status: AC
  Administered 2017-06-10: 2 g via INTRAVENOUS
  Filled 2017-06-10: qty 100

## 2017-06-10 SURGICAL SUPPLY — 50 items
BAG HAMPER (MISCELLANEOUS) ×2 IMPLANT
BANDAGE STRIP 1X3 FLEXIBLE (GAUZE/BANDAGES/DRESSINGS) ×6 IMPLANT
BLADE SURG SZ11 CARB STEEL (BLADE) ×2 IMPLANT
CLOTH BEACON ORANGE TIMEOUT ST (SAFETY) ×2 IMPLANT
COVER LIGHT HANDLE STERIS (MISCELLANEOUS) ×4 IMPLANT
DECANTER SPIKE VIAL GLASS SM (MISCELLANEOUS) ×2 IMPLANT
DURAPREP 26ML APPLICATOR (WOUND CARE) ×2 IMPLANT
ELECT REM PT RETURN 9FT ADLT (ELECTROSURGICAL) ×2
ELECTRODE REM PT RTRN 9FT ADLT (ELECTROSURGICAL) ×1 IMPLANT
FILTER SMOKE EVAC LAPAROSHD (FILTER) ×2 IMPLANT
FORMALIN 10 PREFIL 480ML (MISCELLANEOUS) ×4 IMPLANT
GLOVE BIOGEL PI IND STRL 7.0 (GLOVE) ×1 IMPLANT
GLOVE BIOGEL PI IND STRL 7.5 (GLOVE) IMPLANT
GLOVE BIOGEL PI IND STRL 9 (GLOVE) ×1 IMPLANT
GLOVE BIOGEL PI INDICATOR 7.0 (GLOVE) ×2
GLOVE BIOGEL PI INDICATOR 7.5 (GLOVE) ×2
GLOVE BIOGEL PI INDICATOR 9 (GLOVE) ×1
GLOVE ECLIPSE 7.0 STRL STRAW (GLOVE) ×1 IMPLANT
GLOVE ECLIPSE 9.0 STRL (GLOVE) ×2 IMPLANT
GOWN SPEC L3 XXLG W/TWL (GOWN DISPOSABLE) ×4 IMPLANT
GOWN STRL REUS W/TWL LRG LVL3 (GOWN DISPOSABLE) ×2 IMPLANT
INST SET LAPROSCOPIC GYN AP (KITS) ×2 IMPLANT
IV NS IRRIG 3000ML ARTHROMATIC (IV SOLUTION) ×2 IMPLANT
KIT ROOM TURNOVER APOR (KITS) ×2 IMPLANT
LIGASURE 5MM LAPAROSCOPIC (INSTRUMENTS) IMPLANT
MANIFOLD NEPTUNE II (INSTRUMENTS) ×2 IMPLANT
NDL HYPO 25X1 1.5 SAFETY (NEEDLE) ×1 IMPLANT
NDL INSUFFLATION 14GA 120MM (NEEDLE) ×1 IMPLANT
NEEDLE HYPO 25X1 1.5 SAFETY (NEEDLE) ×2 IMPLANT
NEEDLE INSUFFLATION 14GA 120MM (NEEDLE) ×2 IMPLANT
NS IRRIG 1000ML POUR BTL (IV SOLUTION) ×2 IMPLANT
PACK PERI GYN (CUSTOM PROCEDURE TRAY) ×2 IMPLANT
PAD ARMBOARD 7.5X6 YLW CONV (MISCELLANEOUS) ×2 IMPLANT
SET BASIN LINEN APH (SET/KITS/TRAYS/PACK) ×2 IMPLANT
SET TUBE IRRIG SUCTION NO TIP (IRRIGATION / IRRIGATOR) ×2 IMPLANT
SHEARS HARMONIC ACE PLUS 36CM (ENDOMECHANICALS) ×2 IMPLANT
SLEEVE ENDOPATH XCEL 5M (ENDOMECHANICALS) ×2 IMPLANT
SOLUTION ANTI FOG 6CC (MISCELLANEOUS) ×2 IMPLANT
STRIP CLOSURE SKIN 1/4X3 (GAUZE/BANDAGES/DRESSINGS) ×2 IMPLANT
SUT VIC AB 4-0 PS2 27 (SUTURE) ×2 IMPLANT
SUT VICRYL 0 UR6 27IN ABS (SUTURE) ×2 IMPLANT
SYR BULB IRRIGATION 50ML (SYRINGE) ×2 IMPLANT
SYRINGE 10CC LL (SYRINGE) ×4 IMPLANT
SYS BAG RETRIEVAL 10MM (BASKET) ×2
SYSTEM BAG RETRIEVAL 10MM (BASKET) IMPLANT
TRAY FOLEY CATH SILVER 16FR (SET/KITS/TRAYS/PACK) ×2 IMPLANT
TROCAR ENDO BLADELESS 11MM (ENDOMECHANICALS) ×2 IMPLANT
TROCAR XCEL NON-BLD 5MMX100MML (ENDOMECHANICALS) ×2 IMPLANT
TUBING INSUFFLATION (TUBING) ×2 IMPLANT
WARMER LAPAROSCOPE (MISCELLANEOUS) ×2 IMPLANT

## 2017-06-10 NOTE — Anesthesia Postprocedure Evaluation (Signed)
Anesthesia Post Note  Patient: Christine Douglas  Procedure(s) Performed: LAPAROSCOPIC BILATERAL OOPHORECTOMY (Bilateral )  Patient location during evaluation: PACU Anesthesia Type: General Level of consciousness: awake and alert, oriented and patient cooperative Pain management: pain level controlled Vital Signs Assessment: post-procedure vital signs reviewed and stable Respiratory status: spontaneous breathing and respiratory function stable Cardiovascular status: blood pressure returned to baseline and stable Postop Assessment: no headache, adequate PO intake and no apparent nausea or vomiting Anesthetic complications: no     Last Vitals:  Vitals:   06/10/17 1025 06/10/17 1330  BP: 108/67   Resp: (!) 22   Temp:  36.4 C  SpO2: 100%     Last Pain:  Vitals:   06/10/17 0941  TempSrc: Oral                 Ashanti Littles

## 2017-06-10 NOTE — Brief Op Note (Signed)
06/10/2017  2:14 PM  PATIENT:  Christine Douglas  57 y.o. female  PRE-OPERATIVE DIAGNOSIS:  dyspareunia, pelvic adhesions  POST-OPERATIVE DIAGNOSIS:  dyspareunia, pelvic adhesions  PROCEDURE:  Procedure(s): LAPAROSCOPIC BILATERAL OOPHORECTOMY (Bilateral) lysis of adhesions  SURGEON:  Surgeon(s) and Role:    Tilda Burrow, MD - Primary  PHYSICIAN ASSISTANT:   ASSISTANTS: Valetta Close RNFA  ANESTHESIA:   general  EBL:  40 mL   BLOOD ADMINISTERED:none  DRAINS: none   LOCAL MEDICATIONS USED:  NONE  SPECIMEN:  Source of Specimen:  Ovaries bilaterally removed  DISPOSITION OF SPECIMEN:  PATHOLOGY  COUNTS:  YES  TOURNIQUET:  * No tourniquets in log *  DICTATION: .Dragon Dictation  PLAN OF CARE: Discharge to home after PACU  PATIENT DISPOSITION:  PACU - hemodynamically stable.   Delay start of Pharmacological VTE agent (>24hrs) due to surgical blood loss or risk of bleeding: not applicable Details of procedure patient was taken the operating room prepped and draped for abdominal procedure with legs in yellowfin low lithotomy support with Foley catheter in place abdomen prepped and draped and timeout conducted.  Antibiotics were administered.  Procedure confirmed by surgical team.  Attention was directed to the umbilicus where a midline vertical incision 1 cm in length was made just below the prior tummy tuck periumbilical incision, with sharp dissection through the skin which showed generous tendency to ooze.  Suprapubic incision was made 1 cm in length transversely and 1.  The Veress needle was introduced through the umbilicus without difficulty and pneumoperitoneum achieved under 5 mm intra-abdominal pressure with after water droplet technique confirmed easy flow into the abdominal the blunt laparoscopic trocar was then used to insert without difficulty using laparoscopic direct visualization technique and the abdominal cavity was scanned in trauma or bleeding.  Attention was then  directed suprapubic and right lower quadrant trochars which were placed under careful direct visualization.  Attention was then directed to the pelvis.  Omental adhesions to the pelvis were noted in harmonic a 7 was used to the right lower quadrant trocar site while grasping laparoscopic forceps were used to manipulate the omental adhesions such that they can be taken down at the abdominal wall surface freeing up the pelvis.  Ovaries could be visualized bilaterally.  The right tube and ovary were very anterior and had some omental adhesions.  On the left side they were attached to epiploic fat adhesions to the coming off of the sigmoid. Photodocumentation was taken intermittently.  The left ovary was visible and there was no distinct identifiable fallopian tube the left IP ligament could be identified easily stayed well away from the retroperitoneum once the entire ovary and the distal IP ligament were mobilized, the harmonic ACE 7 was used to amputate the ovary off remaining close to the ovary and well away from the pelvic sidewall.  Hemostasis was excellent throughout.  This was placed in the pelvis and the right tube and ovary then addressed.  Omental adhesions were freed up and adhesions to the pelvic floor were freed up.  At this time the right IP ligament could be easily identified.  The ureter was visualized on the right side well out of the way procedure and again staying close to the ovary the harmonic ACE7 was used to amputate the right IP ligament confirming hemostasis.  The specimens were then grasped in an Endo Catch bag which was placed through the umbilical 11 mm port all 5 mm camera was used through the suprapubic site to  guide the procedure.  Specimen was placed in the Endo Catch bag pulled up to the abdominal wall where the bag could be pulled through the abdominal wall this laparoscopic sleeve removed and then the specimen teased out through the relatively small millimeter port without much  difficulty.  The camera was reinserted and the pelvis confirmed as hemostatic.  Saline times 100 cc was instilled into the abdomen with the patient placed supine, laparoscopic trochars removed and then abdominal wall closure performed by first closing the transverse defect in the umbilical area at the fascial level with interrupted 0 Vicryl suture.  The subcutaneous tissues were then reapproximated with subcutaneous then subcuticular 4-0 Vicryl with good skin edge approximation followed by Steri-Strip placement.  This was followed by closing the right lower quadrant and suprapubic trocar sites using a single subcuticular 4-0 Vicryl interrupted suture patient tolerated procedure well and went to recovery room in good condition with sponge and needle counts correct

## 2017-06-10 NOTE — Op Note (Signed)
Please see the brief operative note for surgical details 

## 2017-06-10 NOTE — Transfer of Care (Signed)
Immediate Anesthesia Transfer of Care Note  Patient: Christine Douglas  Procedure(s) Performed: LAPAROSCOPIC BILATERAL OOPHORECTOMY (Bilateral )  Patient Location: PACU  Anesthesia Type:General  Level of Consciousness: awake, alert , oriented and patient cooperative  Airway & Oxygen Therapy: Patient Spontanous Breathing and Patient connected to nasal cannula oxygen  Post-op Assessment: Report given to RN and Post -op Vital signs reviewed and stable  Post vital signs: Reviewed and stable  Last Vitals:  Vitals:   06/10/17 1005 06/10/17 1025  BP: 105/76 108/67  Resp: 15 (!) 22  Temp:    SpO2: 98% 100%    Last Pain:  Vitals:   06/10/17 0941  TempSrc: Oral         Complications: No apparent anesthesia complications
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
## 2017-06-10 NOTE — Discharge Instructions (Signed)
Bilateral Salpingo-Oophorectomy Bilateral salpingo-oophorectomy is the surgical removal of both fallopian tubes and both ovaries. The ovaries are reproductive organs that produce eggs in women. The fallopian tubes allow eggs to move from the ovaries to the uterus. You may need this procedure if you:  Have had your uterus removed. This procedure is usually done after the uterus is removed.  Have cancer of the fallopian tubes or ovaries.  Have a high risk of cancer of the fallopian tubes or ovaries.  There are three different techniques that can be used for this procedure:  Open. One large incision will be made in your abdomen.  Laparoscopic. A thin, lighted tube with a small camera on the end (laparoscope) will be used to help perform the procedure. The laparoscope will allow your surgeon to make several small incisions in the abdomen instead of one large incision.  Robot-assisted. A computer will be used to control surgical instruments that are attached to robotic arms. A laparoscope may also be used with this technique.  As a result of this procedure, you will become sterile (unable to become pregnant), and you will go into menopause (no longer able to have menstrual periods). You may develop symptoms of menopause such as hot flashes, night sweats, and mood changes. Your sex drive may also be affected. Tell a health care provider about:  Any allergies you have.  All medicines you are taking, including vitamins, herbs, eye drops, creams, and over-the-counter medicines.  Any problems you or family members have had with anesthetic medicines.  Any blood disorders you have.  Any surgeries you have had.  Any medical conditions you have.  Whether you are pregnant or may be pregnant. What are the risks? Generally, this is a safe procedure. However, problems may occur, including:  Infection.  Bleeding.  Allergic reactions to medicines.  Damage to other structures or  organs.  Blood clots in the legs or lungs.  What happens before the procedure? Staying hydrated Follow instructions from your health care provider about hydration, which may include:  Up to 2 hours before the procedure - you may continue to drink clear liquids, such as water, clear fruit juice, black coffee, and plain tea.  Eating and drinking restrictions Follow instructions from your health care provider about eating and drinking, which may include:  8 hours before the procedure - stop eating heavy meals or foods such as meat, fried foods, or fatty foods.  6 hours before the procedure - stop eating light meals or foods, such as toast or cereal.  6 hours before the procedure - stop drinking milk or drinks that contain milk.  2 hours before the procedure - stop drinking clear liquids.  Medicines  Ask your health care provider about: ? Changing or stopping your regular medicines. This is especially important if you are taking diabetes medicines or blood thinners. ? Taking medicines such as aspirin and ibuprofen. These medicines can thin your blood. Do not take these medicines before your procedure if your health care provider instructs you not to.  You may be given antibiotic medicine to help prevent infection. General instructions  Do not smoke for at least 2 weeks before your procedure or as told by your health care provider.  You may have an exam or testing.  You may have a blood or urine sample taken.  Ask your health care provider how your surgical site will be marked or identified.  Plan to have someone take you home from the hospital.  If you  will be going home right after the procedure, plan to have someone with you for 24 hours. What happens during the procedure?  To reduce your risk of infection: ? Your health care team will wash or sanitize their hands. ? Your skin will be washed with soap. ? Hair may be removed from the surgical area.  An IV tube will be  inserted into one of your veins.  You will be given one or more of the following: ? A medicine to help you relax (sedative). ? A medicine to make you fall asleep (general anesthetic).  A thin tube (catheter) will be inserted through your urethra and into your bladder. The catheter drains urine during your procedure.  Depending on the type of surgery you are having, your surgeon will do one of the following: ? Make one incision in your abdomen (open surgery). ? Make two small incisions in your abdomen (laparoscopic surgery). The laparoscope will be passed through one incision, and surgical instruments will be passed through the other. ? Make several small incisions in your abdomen (robot-assisted surgery). A laparoscope and other surgical instruments may be passed through the incisions.  Your fallopian tubes and ovaries will be cut away from the uterus and removed.  Your blood vessels will be clamped and tied to prevent too much bleeding.  The incision(s) in your abdomen will be closed with stitches (sutures) or staples.  A bandage (dressing) may be placed over your incision(s). The procedure may vary among health care providers and hospitals. What happens after the procedure?  Your blood pressure, heart rate, breathing rate, and blood oxygen level will be monitored until the medicines you were given have worn off.  You may continue to receive fluids and medicines through an IV tube.  You may continue to have a catheter draining your urine.  You may have to wear compression stockings. These stockings help to prevent blood clots and reduce swelling in your legs.  You will be given pain medicine as needed.  Do not drive for 24 hours if you received a sedative. Summary  Bilateral salpingo-oophorectomy is a procedure to remove both fallopian tubes and both ovaries.  There are three different techniques that can be used for this procedure, including open, laparoscopic, and robotic.  Talk with your health care provider about how your procedure will be done.  As a result of this procedure, you will become sterile and you will go into menopause.  Plan to have someone take you home from the hospital. This information is not intended to replace advice given to you by your health care provider. Make sure you discuss any questions you have with your health care provider. Document Released: 04/22/2005 Document Revised: 05/27/2016 Document Reviewed: 05/27/2016 Elsevier Interactive Patient Education  2018 [employer]. Diagnostic Laparoscopy A diagnostic laparoscopy is a procedure to diagnose diseases in the abdomen. During the procedure, a thin, lighted, pencil-sized instrument called a laparoscope is inserted into the abdomen through an incision. The laparoscope allows your health care provider to look at the organs inside your body. Tell a health care provider about:  Any allergies you have.  All medicines you are taking, including vitamins, herbs, eye drops, creams, and over-the-counter medicines.  Any problems you or family members have had with anesthetic medicines.  Any blood disorders you have.  Any surgeries you have had.  Any medical conditions you have. What are the risks? Generally, this is a safe procedure. However, problems can occur, which may include:  Infection.  Bleeding.  Damage to other organs.  Allergic reaction to the anesthetics used during the procedure.  What happens before the procedure?  Do not eat or drink anything after midnight on the night before the procedure or as directed by your health care provider.  Ask your health care provider about: ? Changing or stopping your regular medicines. ? Taking medicines such as aspirin and ibuprofen. These medicines can thin your blood. Do not take these medicines before your procedure if your health care provider instructs you not to.  Plan to have someone take you home after the  procedure. What happens during the procedure?  You may be given a medicine to help you relax (sedative).  You will be given a medicine to make you sleep (general anesthetic).  Your abdomen will be inflated with a gas. This will make your organs easier to see.  Small incisions will be made in your abdomen.  A laparoscope and other small instruments will be inserted into the abdomen through the incisions.  A tissue sample may be removed from an organ in the abdomen for examination.  The instruments will be removed from the abdomen.  The gas will be released.  The incisions will be closed with stitches (sutures). What happens after the procedure? Your blood pressure, heart rate, breathing rate, and blood oxygen level will be monitored often until the medicines you were given have worn off. This information is not intended to replace advice given to you by your health care provider. Make sure you discuss any questions you have with your health care provider. Document Released: 07/29/2000 Document Revised: 08/31/2015 Document Reviewed: 12/03/2013 Elsevier Interactive Patient Education  [employer].

## 2017-06-10 NOTE — Anesthesia Preprocedure Evaluation (Signed)
Anesthesia Evaluation  Patient identified by MRN, date of birth, ID band Patient awake    Reviewed: Allergy & Precautions, H&P , NPO status , Patient's Chart, lab work & pertinent test results  Airway Mallampati: I  TM Distance: >3 FB Neck ROM: Full    Dental  (+) Teeth Intact   Pulmonary former smoker,    Pulmonary exam normal        Cardiovascular negative cardio ROS   Rhythm:Regular Rate:Normal     Neuro/Psych  Headaches, PSYCHIATRIC DISORDERS (PTSD) Anxiety Depression Bipolar Disorder  Neuromuscular disease    GI/Hepatic negative GI ROS, Neg liver ROS,   Endo/Other  negative endocrine ROS  Renal/GU negative Renal ROS     Musculoskeletal   Abdominal   Peds  Hematology negative hematology ROS (+)   Anesthesia Other Findings   Reproductive/Obstetrics                             Anesthesia Physical Anesthesia Plan  ASA: III  Anesthesia Plan: General   Post-op Pain Management:    Induction: Intravenous  PONV Risk Score and Plan:   Airway Management Planned: Oral ETT  Additional Equipment:   Intra-op Plan:   Post-operative Plan: Extubation in OR  Informed Consent: I have reviewed the patients History and Physical, chart, labs and discussed the procedure including the risks, benefits and alternatives for the proposed anesthesia with the patient or authorized representative who has indicated his/her understanding and acceptance.     Plan Discussed with:   Anesthesia Plan Comments:         Anesthesia Quick Evaluation

## 2017-06-11 ENCOUNTER — Encounter (HOSPITAL_COMMUNITY): Payer: Self-pay | Admitting: Obstetrics and Gynecology

## 2017-06-13 ENCOUNTER — Other Ambulatory Visit: Payer: Self-pay | Admitting: Obstetrics and Gynecology

## 2017-06-13 ENCOUNTER — Telehealth: Payer: Self-pay | Admitting: *Deleted

## 2017-06-13 MED ORDER — OXYCODONE-ACETAMINOPHEN 5-325 MG PO TABS: 1.0000 | ORAL_TABLET | Freq: Four times a day (QID) | ORAL | 0 refills | Status: DC | PRN

## 2017-06-13 NOTE — Telephone Encounter (Signed)
Patient states she did not sleep well last night and was sorer than she has been. States she went to the grocery store and walked around and may have done too much. Encouraged patient to increase activity slowly. States she has been taking the Toradol and Percocet with relief but is down to 1 percocet and would like a small refill before the weekend. Informed I would send to Dr Emelda FearFerguson.

## 2017-06-15 ENCOUNTER — Other Ambulatory Visit (HOSPITAL_COMMUNITY): Payer: Self-pay | Admitting: Psychiatry

## 2017-06-18 ENCOUNTER — Other Ambulatory Visit: Payer: Self-pay

## 2017-06-18 ENCOUNTER — Ambulatory Visit: Payer: BLUE CROSS/BLUE SHIELD | Admitting: Obstetrics and Gynecology

## 2017-06-18 ENCOUNTER — Encounter: Payer: Self-pay | Admitting: Obstetrics and Gynecology

## 2017-06-18 VITALS — BP 108/78 | HR 88 | Ht 64.0 in | Wt 120.0 lb

## 2017-06-18 DIAGNOSIS — Z09 Encounter for follow-up examination after completed treatment for conditions other than malignant neoplasm: Secondary | ICD-10-CM

## 2017-06-18 DIAGNOSIS — Z9889 Other specified postprocedural states: Secondary | ICD-10-CM

## 2017-06-18 NOTE — Progress Notes (Signed)
   Subjective:  Christine Douglas is a 57 y.o. female now 1 weeks status post LAPAROSCOPIC BILATERAL OOPHORECTOMY (Bilateral) lysis of adhesions.   Review of Systems Negative except some bloating. She has been urinating frequently.   Diet:   normal   Bowel movements : normal. She did not have a bowel movement until last night. She took 14 stool softeners.   The patient is not having any pain.  Objective:  There were no vitals taken for this visit. General:Well developed, well nourished.  No acute distress. Abdomen: Bowel sounds normal, soft, non-tender. Pelvic Exam: Not indicated   Incision(s):   Healing well, no drainage, no erythema, no hernia, no swelling, no dehiscence, photos of surgery reviewed with the patient   Assessment:  Post-Op 1 weeks s/p LAPAROSCOPIC BILATERAL OOPHORECTOMY (Bilateral) lysis of adhesions    Doing well postoperatively.  May resume work at full duty at 2 weeks after surgery   Plan:  1.Wound care discussed  2. . current medications. Dulcolax suppository for bowel movements 3. Activity restrictions: No sexual intercourse 4. return to work: in 1 week. 5. Follow up in 4 weeks.  If patient desires to be seen again.   By signing my name below, I, Christine Douglas, attest that this documentation has been prepared under the direction and in the presence of Christine Douglas, Christine Hamed V, MD. Electronically Signed: Redge GainerIzna Douglas, Medical Scribe. 06/18/17. 10:35 AM.  I personally performed the services described in this documentation, which was SCRIBED in my presence. The recorded information has been reviewed and considered accurate. It has been edited as necessary during review. Christine BurrowJohn Douglas Cohan Stipes, MD

## 2017-06-24 ENCOUNTER — Other Ambulatory Visit (HOSPITAL_COMMUNITY): Payer: Self-pay

## 2017-06-24 DIAGNOSIS — F4310 Post-traumatic stress disorder, unspecified: Secondary | ICD-10-CM

## 2017-06-24 DIAGNOSIS — F331 Major depressive disorder, recurrent, moderate: Secondary | ICD-10-CM

## 2017-06-25 MED ORDER — VENLAFAXINE HCL ER 150 MG PO CP24: 150.0000 mg | ORAL_CAPSULE | Freq: Every day | ORAL | 1 refills | Status: DC

## 2017-06-25 MED ORDER — PRAZOSIN HCL 2 MG PO CAPS: 2.0000 mg | ORAL_CAPSULE | Freq: Every day | ORAL | 1 refills | Status: DC

## 2017-06-25 MED ORDER — DIAZEPAM 10 MG PO TABS: 10.0000 mg | ORAL_TABLET | Freq: Two times a day (BID) | ORAL | 0 refills | Status: DC | PRN

## 2017-07-08 DIAGNOSIS — Z029 Encounter for administrative examinations, unspecified: Secondary | ICD-10-CM

## 2017-07-10 ENCOUNTER — Encounter (HOSPITAL_COMMUNITY): Payer: Self-pay | Admitting: Psychiatry

## 2017-07-10 ENCOUNTER — Ambulatory Visit (INDEPENDENT_AMBULATORY_CARE_PROVIDER_SITE_OTHER): Payer: BLUE CROSS/BLUE SHIELD | Admitting: Psychiatry

## 2017-07-10 ENCOUNTER — Other Ambulatory Visit (HOSPITAL_COMMUNITY): Payer: Self-pay | Admitting: Psychiatry

## 2017-07-10 VITALS — BP 122/68 | HR 76 | Ht 64.0 in | Wt 120.0 lb

## 2017-07-10 DIAGNOSIS — F331 Major depressive disorder, recurrent, moderate: Secondary | ICD-10-CM | POA: Diagnosis not present

## 2017-07-10 DIAGNOSIS — F4310 Post-traumatic stress disorder, unspecified: Secondary | ICD-10-CM | POA: Diagnosis not present

## 2017-07-10 DIAGNOSIS — Z87891 Personal history of nicotine dependence: Secondary | ICD-10-CM

## 2017-07-10 DIAGNOSIS — Z818 Family history of other mental and behavioral disorders: Secondary | ICD-10-CM

## 2017-07-10 DIAGNOSIS — Z810 Family history of intellectual disabilities: Secondary | ICD-10-CM

## 2017-07-10 DIAGNOSIS — F99 Mental disorder, not otherwise specified: Secondary | ICD-10-CM | POA: Diagnosis not present

## 2017-07-10 DIAGNOSIS — Z62810 Personal history of physical and sexual abuse in childhood: Secondary | ICD-10-CM

## 2017-07-10 DIAGNOSIS — M542 Cervicalgia: Secondary | ICD-10-CM | POA: Diagnosis not present

## 2017-07-10 DIAGNOSIS — F5105 Insomnia due to other mental disorder: Secondary | ICD-10-CM

## 2017-07-10 MED ORDER — PRAZOSIN HCL 2 MG PO CAPS: 2.0000 mg | ORAL_CAPSULE | Freq: Every day | ORAL | 2 refills | Status: DC

## 2017-07-10 MED ORDER — VENLAFAXINE HCL ER 75 MG PO CP24: 225.0000 mg | ORAL_CAPSULE | Freq: Every day | ORAL | 2 refills | Status: DC

## 2017-07-10 MED ORDER — DIAZEPAM 10 MG PO TABS: 10.0000 mg | ORAL_TABLET | Freq: Two times a day (BID) | ORAL | 2 refills | Status: DC | PRN
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
## 2017-07-14 DIAGNOSIS — L309 Dermatitis, unspecified: Secondary | ICD-10-CM | POA: Diagnosis not present

## 2017-07-15 ENCOUNTER — Other Ambulatory Visit (HOSPITAL_COMMUNITY): Payer: Self-pay

## 2017-07-15 MED ORDER — HYDROXYZINE PAMOATE 25 MG PO CAPS: 25.0000 mg | ORAL_CAPSULE | Freq: Two times a day (BID) | ORAL | 0 refills | Status: DC

## 2017-07-16 ENCOUNTER — Encounter: Payer: BLUE CROSS/BLUE SHIELD | Admitting: Obstetrics and Gynecology

## 2017-07-21 ENCOUNTER — Other Ambulatory Visit (HOSPITAL_COMMUNITY): Payer: Self-pay

## 2017-07-21 MED ORDER — DOXEPIN HCL 10 MG PO CAPS: 10.0000 mg | ORAL_CAPSULE | Freq: Every day | ORAL | 2 refills | Status: DC

## 2017-08-07 ENCOUNTER — Ambulatory Visit (HOSPITAL_COMMUNITY): Payer: BLUE CROSS/BLUE SHIELD | Admitting: Psychiatry

## 2017-08-07 DIAGNOSIS — F314 Bipolar disorder, current episode depressed, severe, without psychotic features: Secondary | ICD-10-CM | POA: Diagnosis not present
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
## 2017-09-02 ENCOUNTER — Other Ambulatory Visit: Payer: Self-pay

## 2017-09-02 ENCOUNTER — Encounter (HOSPITAL_COMMUNITY): Payer: Self-pay | Admitting: Emergency Medicine

## 2017-09-02 ENCOUNTER — Inpatient Hospital Stay (HOSPITAL_COMMUNITY)
Admission: EM | Admit: 2017-09-02 | Discharge: 2017-09-05 | DRG: 389 | Disposition: A | Discharge status: Home / Self Care | Payer: BLUE CROSS/BLUE SHIELD | Attending: Internal Medicine | Admitting: Internal Medicine

## 2017-09-02 ENCOUNTER — Emergency Department (HOSPITAL_COMMUNITY): Payer: BLUE CROSS/BLUE SHIELD

## 2017-09-02 DIAGNOSIS — F419 Anxiety disorder, unspecified: Secondary | ICD-10-CM | POA: Diagnosis not present

## 2017-09-02 DIAGNOSIS — Z62810 Personal history of physical and sexual abuse in childhood: Secondary | ICD-10-CM | POA: Diagnosis not present

## 2017-09-02 DIAGNOSIS — N393 Stress incontinence (female) (male): Secondary | ICD-10-CM | POA: Diagnosis not present

## 2017-09-02 DIAGNOSIS — Z981 Arthrodesis status: Secondary | ICD-10-CM

## 2017-09-02 DIAGNOSIS — R140 Abdominal distension (gaseous): Secondary | ICD-10-CM | POA: Diagnosis not present

## 2017-09-02 DIAGNOSIS — F3130 Bipolar disorder, current episode depressed, mild or moderate severity, unspecified: Secondary | ICD-10-CM | POA: Diagnosis not present

## 2017-09-02 DIAGNOSIS — Z884 Allergy status to anesthetic agent status: Secondary | ICD-10-CM

## 2017-09-02 DIAGNOSIS — F909 Attention-deficit hyperactivity disorder, unspecified type: Secondary | ICD-10-CM | POA: Diagnosis present

## 2017-09-02 DIAGNOSIS — K56600 Partial intestinal obstruction, unspecified as to cause: Principal | ICD-10-CM | POA: Diagnosis present

## 2017-09-02 DIAGNOSIS — G2581 Restless legs syndrome: Secondary | ICD-10-CM | POA: Diagnosis present

## 2017-09-02 DIAGNOSIS — G709 Myoneural disorder, unspecified: Secondary | ICD-10-CM | POA: Diagnosis not present

## 2017-09-02 DIAGNOSIS — Z8349 Family history of other endocrine, nutritional and metabolic diseases: Secondary | ICD-10-CM

## 2017-09-02 DIAGNOSIS — Z87891 Personal history of nicotine dependence: Secondary | ICD-10-CM

## 2017-09-02 DIAGNOSIS — R109 Unspecified abdominal pain: Secondary | ICD-10-CM | POA: Diagnosis not present

## 2017-09-02 DIAGNOSIS — Z90710 Acquired absence of both cervix and uterus: Secondary | ICD-10-CM

## 2017-09-02 DIAGNOSIS — K56609 Unspecified intestinal obstruction, unspecified as to partial versus complete obstruction: Secondary | ICD-10-CM | POA: Diagnosis present

## 2017-09-02 DIAGNOSIS — Z888 Allergy status to other drugs, medicaments and biological substances status: Secondary | ICD-10-CM

## 2017-09-02 DIAGNOSIS — R350 Frequency of micturition: Secondary | ICD-10-CM | POA: Diagnosis not present

## 2017-09-02 DIAGNOSIS — R1084 Generalized abdominal pain: Secondary | ICD-10-CM | POA: Diagnosis not present

## 2017-09-02 LAB — URINALYSIS, ROUTINE W REFLEX MICROSCOPIC
BILIRUBIN URINE: NEGATIVE
Glucose, UA: NEGATIVE mg/dL
Hgb urine dipstick: NEGATIVE
KETONES UR: NEGATIVE mg/dL
Leukocytes, UA: NEGATIVE
NITRITE: NEGATIVE
PH: 7 (ref 5.0–8.0)
Protein, ur: NEGATIVE mg/dL
Specific Gravity, Urine: 1.015 (ref 1.005–1.030)

## 2017-09-02 LAB — CBC WITH DIFFERENTIAL/PLATELET
BASOS PCT: 1 %
Basophils Absolute: 0.1 10*3/uL (ref 0.0–0.1)
Eosinophils Absolute: 0.2 10*3/uL (ref 0.0–0.7)
Eosinophils Relative: 3 %
HCT: 38.9 % (ref 36.0–46.0)
Hemoglobin: 12.3 g/dL (ref 12.0–15.0)
Lymphocytes Relative: 26 %
Lymphs Abs: 2 10*3/uL (ref 0.7–4.0)
MCH: 28.3 pg (ref 26.0–34.0)
MCHC: 31.6 g/dL (ref 30.0–36.0)
MCV: 89.6 fL (ref 78.0–100.0)
MONO ABS: 0.6 10*3/uL (ref 0.1–1.0)
MONOS PCT: 8 %
Neutro Abs: 4.9 10*3/uL (ref 1.7–7.7)
Neutrophils Relative %: 62 %
Platelets: 265 10*3/uL (ref 150–400)
RBC: 4.34 MIL/uL (ref 3.87–5.11)
RDW: 12.9 % (ref 11.5–15.5)
WBC: 7.7 10*3/uL (ref 4.0–10.5)

## 2017-09-02 LAB — COMPREHENSIVE METABOLIC PANEL
ALBUMIN: 4.2 g/dL (ref 3.5–5.0)
ALK PHOS: 86 U/L (ref 38–126)
ALT: 20 U/L (ref 14–54)
AST: 25 U/L (ref 15–41)
Anion gap: 8 (ref 5–15)
BUN: 20 mg/dL (ref 6–20)
CALCIUM: 9 mg/dL (ref 8.9–10.3)
CO2: 29 mmol/L (ref 22–32)
CREATININE: 0.84 mg/dL (ref 0.44–1.00)
Chloride: 105 mmol/L (ref 101–111)
GFR calc Af Amer: >60 mL/min (ref >60)
GFR calc non Af Amer: >60 mL/min (ref >60)
Glucose, Bld: 104 mg/dL — ABNORMAL HIGH (ref 65–99)
Potassium: 4.1 mmol/L (ref 3.5–5.1)
SODIUM: 142 mmol/L (ref 135–145)
Total Bilirubin: 0.5 mg/dL (ref 0.3–1.2)
Total Protein: 7.3 g/dL (ref 6.5–8.1)

## 2017-09-02 LAB — LIPASE, BLOOD: Lipase: 39 U/L (ref 11–51)

## 2017-09-02 LAB — LACTIC ACID, PLASMA: LACTIC ACID, VENOUS: 0.5 mmol/L (ref 0.5–1.9)

## 2017-09-02 MED ORDER — SODIUM CHLORIDE 0.9 % IV SOLN
INTRAVENOUS | Status: DC
  Administered 2017-09-02 – 2017-09-03 (×3): via INTRAVENOUS

## 2017-09-02 MED ORDER — ONDANSETRON HCL 4 MG/2ML IJ SOLN
4.0000 mg | Freq: Once | INTRAMUSCULAR | Status: AC
  Administered 2017-09-02: 4 mg via INTRAVENOUS
  Filled 2017-09-02: qty 2

## 2017-09-02 MED ORDER — ONDANSETRON HCL 4 MG PO TABS: 4.0000 mg | ORAL_TABLET | Freq: Four times a day (QID) | ORAL | Status: DC | PRN

## 2017-09-02 MED ORDER — MORPHINE SULFATE (PF) 2 MG/ML IV SOLN
2.0000 mg | INTRAVENOUS | Status: DC | PRN
  Administered 2017-09-03 – 2017-09-05 (×15): 2 mg via INTRAVENOUS
  Filled 2017-09-02 (×16): qty 1

## 2017-09-02 MED ORDER — ACETAMINOPHEN 325 MG PO TABS
650.0000 mg | ORAL_TABLET | Freq: Four times a day (QID) | ORAL | Status: DC | PRN
  Administered 2017-09-03 – 2017-09-05 (×3): 650 mg via ORAL
  Filled 2017-09-02 (×4): qty 2

## 2017-09-02 MED ORDER — HEPARIN SODIUM (PORCINE) 5000 UNIT/ML IJ SOLN
5000.0000 [IU] | Freq: Three times a day (TID) | INTRAMUSCULAR | Status: DC
Start: 2017-09-02 — End: 2017-09-05
  Administered 2017-09-03 – 2017-09-05 (×8): 5000 [IU] via SUBCUTANEOUS
  Filled 2017-09-02 (×8): qty 1

## 2017-09-02 MED ORDER — ACETAMINOPHEN 650 MG RE SUPP: 650.0000 mg | Freq: Four times a day (QID) | RECTAL | Status: DC | PRN

## 2017-09-02 MED ORDER — ONDANSETRON HCL 4 MG/2ML IJ SOLN
4.0000 mg | Freq: Four times a day (QID) | INTRAMUSCULAR | Status: DC | PRN
  Administered 2017-09-03 – 2017-09-05 (×3): 4 mg via INTRAVENOUS
  Filled 2017-09-02 (×3): qty 2

## 2017-09-02 MED ORDER — SODIUM CHLORIDE 0.9 % IV BOLUS
1000.0000 mL | Freq: Once | INTRAVENOUS | Status: AC
  Administered 2017-09-02: 1000 mL via INTRAVENOUS

## 2017-09-02 MED ORDER — FAMOTIDINE IN NACL 20-0.9 MG/50ML-% IV SOLN
20.0000 mg | Freq: Two times a day (BID) | INTRAVENOUS | Status: DC
  Administered 2017-09-03 – 2017-09-05 (×6): 20 mg via INTRAVENOUS
  Filled 2017-09-02 (×6): qty 50

## 2017-09-02 MED ORDER — IOPAMIDOL (ISOVUE-300) INJECTION 61%
100.0000 mL | Freq: Once | INTRAVENOUS | Status: AC | PRN
  Administered 2017-09-02: 100 mL via INTRAVENOUS

## 2017-09-02 MED ORDER — MORPHINE SULFATE (PF) 4 MG/ML IV SOLN
4.0000 mg | Freq: Once | INTRAVENOUS | Status: AC
  Administered 2017-09-02: 4 mg via INTRAVENOUS
  Filled 2017-09-02: qty 1

## 2017-09-02 NOTE — H&P (Signed)
History and Physical    Christine Douglas ZOX:[id number] DOB: [birth year] DOA: 09/02/2017  PCP: Renaye Rakers, MD   Patient coming from: Home.  I have personally briefly reviewed patient's old medical records in [facility]  Chief Complaint: Abdominal pain.  HPI: Christine Douglas is a 57 y.o. female with medical history significant of ADHD, anxiety, depression, bacterial vaginosis history, headache, neuromuscular diameter C6-C7, restless syndrome, history of tubal occlusion was coming to the emergency department with complaints of abdominal pain for the past 4 months after having hysterectomy.  This has been associated with nausea, decreased appetite, decreased fluid intake, which is secondary to having stress incontinence, initially during cough or sneezing.  However, over the past month or so, the patient has experienced having urine involuntarily running down her thigh.  She denies emesis, diarrhea, melena or hematochezia.  She has occasional constipation.  She denies dysuria, hematuria or oliguria.  No fever, chills, sore throat, dyspnea, chest pain, palpitations, dizziness, diaphoresis, PND, orthopnea or pitting edema of the lower extremities.  No rashes or pruritus.  Complaints of both call and heat intolerance on occasion.  No polyuria, polydipsia, polyphagia or blurred vision.  ED Course: Initial vital signs temperature 36.9 C (98.2 F), pulse 87, respirations 18, blood pressure 124/70 mmHg and O2 sat on 100% on room air.  The patient received 4 mg of morphine and 4 mg of Zofran IVP x1 in the ED.  I added at 1000 mL normal saline bolus.  The case was discussed by Dr. Hyacinth Meeker with Dr. Lovell Sheehan.  Dr. Lovell Sheehan wants to keep the patient admitted, n.p.o. with IV hydration and symptoms management.  Her urinalysis was normal.  CBC shows a normal white count with normal differential, hemoglobin 12.3 g/dL and platelets 811.  Her CMP was normal, except for a nonfasting glucose of 104 mg/dL.  Lipase,  lactic acid and magnesium level were normal.  Imaging: CT abdomen/pelvis with contrast shows a partial small bowel obstruction with a transition point in the left mid abdomen.  This has an appearance suggesting a closed loop obstruction, possibly due to an internal hernia.  Elevations are also possible.  Please see images and full radiology report for further detail.  Review of Systems: As per HPI otherwise 10 point review of systems negative.   Past Medical History:  Diagnosis Date  . ADHD (attention deficit hyperactivity disorder)   . Anxiety   . BV (bacterial vaginosis)   . Depression   . Headache   . 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 Restless leg syndrome   . TO (tubal occlusion)     Past Surgical History:  Procedure Laterality Date  . ABDOMINAL HYSTERECTOMY    . BACK SURGERY     spine surgery; 2 screws and plate in neck  . CESAREAN SECTION    . LAPAROSCOPIC BILATERAL SALPINGO OOPHERECTOMY Bilateral 06/10/2017   Procedure: LAPAROSCOPIC BILATERAL OOPHORECTOMY;  Surgeon: Tilda Burrow, MD;  Location: [facility];  Service: Gynecology;  Laterality: Bilateral;  . SHOULDER ARTHROSCOPY WITH ROTATOR CUFF REPAIR AND SUBACROMIAL DECOMPRESSION  06/04/2012   Procedure: SHOULDER ARTHROSCOPY WITH ROTATOR CUFF REPAIR AND SUBACROMIAL DECOMPRESSION;  Surgeon: Loreta Ave, MD;  Location: [facility];  Service: Orthopedics;  Laterality: Left;  LEFT ARTHROSCOPY SHOULDER DECOMPRESSION SUBACROMIAL PARTIAL ACROMIOPLASTY WITH CORACOACROMIAL RELEASE, DISTAL CLAVICULECTOMY,  ROTATOR CUFF REPAIR      reports that she has quit smoking. Her smoking use included  cigarettes. She has never used smokeless tobacco. She reports that she does not drink alcohol or use drugs.  Allergies  Allergen Reactions  . Anesthetics, Amide Anaphylaxis    Neck surgery pt unsure of exact anesthetic-afraid to have any further surgeries; Feels weird for a while  . Flexeril [Cyclobenzaprine]  Swelling    Cannot take muscle relaxers -- swelling of legs and body     Family History  Problem Relation Age of Onset  . Hyperlipidemia Mother   . Thyroid disease Mother   . Other Brother        suicide  . Bipolar disorder Son   . Cancer Other   . Dementia Maternal Aunt     Prior to Admission medications   Medication Sig Start Date End Date Taking? Authorizing Provider  diazepam (VALIUM) 10 MG tablet Take 1 tablet (10 mg total) by mouth every 12 (twelve) hours as needed for anxiety. 07/10/17   Oletta Darter, MD  doxepin (SINEQUAN) 10 MG capsule Take 1 capsule (10 mg total) by mouth daily. 07/21/17   Oletta Darter, MD  hydrOXYzine (VISTARIL) 25 MG capsule Take 1 capsule (25 mg total) by mouth 2 (two) times daily. 07/15/17   Oletta Darter, MD  ketorolac (TORADOL) 10 MG tablet Take 1 tablet (10 mg total) by mouth every 6 (six) hours as needed. 06/10/17   Tilda Burrow, MD  oxyCODONE-acetaminophen (PERCOCET/ROXICET) 5-325 MG tablet Take 1 tablet by mouth every 6 (six) hours as needed for moderate pain or severe pain (Postoperative pain). As needed for postoperative pain. May alternate with tramadol Patient not taking: Reported on 06/18/2017 06/13/17   Tilda Burrow, MD  prazosin (MINIPRESS) 2 MG capsule Take 1 capsule (2 mg total) by mouth at bedtime. 07/10/17   Oletta Darter, MD    Physical Exam: Vitals:   09/02/17 1650 09/02/17 2014 09/02/17 2240 09/02/17 2300  BP: 124/78 114/71 (!) 116/96 115/80  Pulse: 87 66 76 70  Resp: Temp: 98.2 F (36.8 C)     TempSrc: Oral     SpO2: 100% 100% 100% 94%  Weight:      Height:        Constitutional: NAD, calm, comfortable Eyes: PERRL, lids and conjunctivae normal ENMT: Mucous membranes are moist. Posterior pharynx clear of any exudate or lesions. Neck: normal, supple, no masses, no thyromegaly Respiratory: Clear to auscultation bilaterally, no wheezing, no crackles. Normal respiratory effort. No accessory muscle use.    Cardiovascular: Regular rate and rhythm, no murmurs / rubs / gallops. No extremity edema. 2+ pedal pulses. No carotid bruits.  Abdomen: Mildly distended.  Bowel sounds positive. Positive epigastric and periumbilical tenderness, no guarding/rebound/masses palpated. No hepatosplenomegaly.  Musculoskeletal: no clubbing / cyanosis. Good ROM, no contractures. Normal muscle tone.  Skin: no rashes, lesions, ulcers on limited dermatological examination. Neurologic: CN 2-12 grossly intact. Sensation intact, DTR normal. Strength 5/5 in all 4.  Psychiatric: Normal judgment and insight. Alert and oriented x 4.. Normal mood.    Labs on Admission: I have personally reviewed following labs and imaging studies  CBC: Recent Labs  Lab 09/02/17 1716  WBC 7.7  NEUTROABS 4.9  HGB 12.3  HCT 38.9  MCV 89.6  PLT 265   Basic Metabolic Panel: Recent Labs  Lab 09/02/17 1716  NA 142  K 4.1  CL 105  CO2 29  GLUCOSE 104*  BUN 20  CREATININE 0.84  CALCIUM 9.0   GFR: Estimated Creatinine Clearance: 66.1 mL/min (by C-G  formula based on SCr of 0.84 mg/dL). Liver Function Tests: Recent Labs  Lab 09/02/17 1716  AST 25  ALT 20  ALKPHOS 86  BILITOT 0.5  PROT 7.3  ALBUMIN 4.2   Recent Labs  Lab 09/02/17 2046  LIPASE 39   No results for input(s): AMMONIA in the last 168 hours. Coagulation Profile: No results for input(s): INR, PROTIME in the last 168 hours. Cardiac Enzymes: No results for input(s): CKTOTAL, CKMB, CKMBINDEX, TROPONINI in the last 168 hours. BNP (last 3 results) No results for input(s): PROBNP in the last 8760 hours. HbA1C: No results for input(s): HGBA1C in the last 72 hours. CBG: No results for input(s): GLUCAP in the last 168 hours. Lipid Profile: No results for input(s): CHOL, HDL, LDLCALC, TRIG, CHOLHDL, LDLDIRECT in the last 72 hours. Thyroid Function Tests: No results for input(s): TSH, T4TOTAL, FREET4, T3FREE, THYROIDAB in the last 72 hours. Anemia Panel: No  results for input(s): VITAMINB12, FOLATE, FERRITIN, TIBC, IRON, RETICCTPCT in the last 72 hours. Urine analysis:    Component Value Date/Time   COLORURINE STRAW (A) 09/02/2017 1657   APPEARANCEUR CLEAR 09/02/2017 1657   LABSPEC 1.015 09/02/2017 1657   PHURINE 7.0 09/02/2017 1657   GLUCOSEU NEGATIVE 09/02/2017 1657   HGBUR NEGATIVE 09/02/2017 1657   BILIRUBINUR NEGATIVE 09/02/2017 1657   KETONESUR NEGATIVE 09/02/2017 1657   PROTEINUR NEGATIVE 09/02/2017 1657   UROBILINOGEN 0.2 01/26/2013 1115   NITRITE NEGATIVE 09/02/2017 1657   LEUKOCYTESUR NEGATIVE 09/02/2017 1657    Radiological Exams on Admission: Ct Abdomen Pelvis W Contrast  Result Date: 09/02/2017 CLINICAL DATA:  Abdominal pain and distension. Urinary incontinence. Status post hysterectomy 2 months ago. EXAM: CT ABDOMEN AND PELVIS WITH CONTRAST TECHNIQUE: Multidetector CT imaging of the abdomen and pelvis was performed using the standard protocol following bolus administration of intravenous contrast. CONTRAST:  ISOVUE-300 IOPAMIDOL (ISOVUE-300) INJECTION 61% COMPARISON:  None. FINDINGS: Lower chest: Mildly prominent pulmonary vasculature. No pleural fluid. Hepatobiliary: No focal liver abnormality is seen. No gallstones, gallbladder wall thickening, or biliary dilatation. Pancreas: Unremarkable. No pancreatic ductal dilatation or surrounding inflammatory changes. Spleen: Normal in size without focal abnormality. Adrenals/Urinary Tract: Adrenal glands are unremarkable. Kidneys are normal, without renal calculi, focal lesion, or hydronephrosis. Bladder is unremarkable. Stomach/Bowel: Multiple loops of dilated jejunum in the left mid abdomen with a transition to normal caliber small bowel in the left mid abdomen. The stomach and small bowel proximal to these loops are normal in caliber. Prominent stool and gas in normal caliber colon. No evidence of appendicitis. Vascular/Lymphatic: No significant vascular findings are present. No  enlarged abdominal or pelvic lymph nodes. Reproductive: Status post hysterectomy. No adnexal masses. Other: No abdominal wall hernia seen. Musculoskeletal: Unremarkable bones. IMPRESSION: Partial small bowel obstruction with a transition in the left mid abdomen. This has an appearance suggesting a closed loop obstruction, possibly due to an internal hernia. Adhesions are also possible. Electronically Signed   By: Beckie Salts M.D.   On: 09/02/2017 21:29    EKG: Independently reviewed.    Assessment/Plan Principal Problem:   SBO (small bowel obstruction) (HCC) Admit to MedSurg/inpatient. Keep n.p.o.,  Except for some ice chips. Continue IV fluids. Zofran 4 mg IVP every 6 hours as needed for nausea/vomiting. Morphine 2 mg IVP every 3 hours as needed for moderate/severe pain. Famotidine 20 mg IVP every 12 hours. Check CBC and CMP in a.m. General surgery evaluation in a.m.  Active Problems:   Anxiety Hold TCA and benzodiazepines, while NPO and on narcotics.  Stress incontinence Per patient, this has gotten a lot worse after surgery. This is likely due to the increase in intra-abdominal pressure. I told the patient that the SBO will have to be dealt with first, and once this is resolved Her stress incontinence can be better evaluated.    DVT prophylaxis: Heparin SQ. Code Status: Full code. Family Communication:  Disposition Plan: Admit for IV hydration, symptoms management and bowel rest. Consults called: Routine general surgery consult. Admission status: Inpatient/MedSurg.   Bobette Mo MD [facility] Pager [phone]  If 7PM-7AM, please contact night-coverage [email or web address] Password [email or web address]  09/02/2017, 11:15 PM
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
## 2017-09-02 NOTE — ED Triage Notes (Signed)
Dr Emelda Fear did hysterectomy 4 months ago, complain of abdominal  Pain and swelling and unable to hold her urine. " it runs down my leg"

## 2017-09-03 ENCOUNTER — Other Ambulatory Visit: Payer: Self-pay

## 2017-09-03 ENCOUNTER — Encounter (HOSPITAL_COMMUNITY): Payer: Self-pay

## 2017-09-03 DIAGNOSIS — K56609 Unspecified intestinal obstruction, unspecified as to partial versus complete obstruction: Secondary | ICD-10-CM

## 2017-09-03 DIAGNOSIS — K56600 Partial intestinal obstruction, unspecified as to cause: Principal | ICD-10-CM

## 2017-09-03 DIAGNOSIS — F419 Anxiety disorder, unspecified: Secondary | ICD-10-CM

## 2017-09-03 DIAGNOSIS — N393 Stress incontinence (female) (male): Secondary | ICD-10-CM | POA: Diagnosis present

## 2017-09-03 LAB — CBC WITH DIFFERENTIAL/PLATELET
BASOS PCT: 1 %
Basophils Absolute: 0.1 10*3/uL (ref 0.0–0.1)
EOS ABS: 0.2 10*3/uL (ref 0.0–0.7)
EOS PCT: 3 %
HCT: 36.5 % (ref 36.0–46.0)
Hemoglobin: 11.6 g/dL — ABNORMAL LOW (ref 12.0–15.0)
Lymphocytes Relative: 38 %
Lymphs Abs: 2.4 10*3/uL (ref 0.7–4.0)
MCH: 28.5 pg (ref 26.0–34.0)
MCHC: 31.8 g/dL (ref 30.0–36.0)
MCV: 89.7 fL (ref 78.0–100.0)
MONO ABS: 0.4 10*3/uL (ref 0.1–1.0)
Monocytes Relative: 7 %
Neutro Abs: 3.1 10*3/uL (ref 1.7–7.7)
Neutrophils Relative %: 51 %
PLATELETS: 227 10*3/uL (ref 150–400)
RBC: 4.07 MIL/uL (ref 3.87–5.11)
RDW: 13 % (ref 11.5–15.5)
WBC: 6.2 10*3/uL (ref 4.0–10.5)

## 2017-09-03 LAB — BASIC METABOLIC PANEL
Anion gap: 7 (ref 5–15)
BUN: 15 mg/dL (ref 6–20)
CALCIUM: 8.4 mg/dL — AB (ref 8.9–10.3)
CO2: 26 mmol/L (ref 22–32)
Chloride: 108 mmol/L (ref 101–111)
Creatinine, Ser: 0.77 mg/dL (ref 0.44–1.00)
GLUCOSE: 85 mg/dL (ref 65–99)
Potassium: 3.8 mmol/L (ref 3.5–5.1)
Sodium: 141 mmol/L (ref 135–145)

## 2017-09-03 LAB — MAGNESIUM: Magnesium: 2 mg/dL (ref 1.7–2.4)

## 2017-09-03 MED ORDER — LAMOTRIGINE 25 MG PO TABS
25.0000 mg | ORAL_TABLET | Freq: Three times a day (TID) | ORAL | Status: DC
  Administered 2017-09-03 – 2017-09-05 (×5): 25 mg via ORAL
  Filled 2017-09-03 (×14): qty 1

## 2017-09-03 MED ORDER — LORATADINE 10 MG PO TABS
10.0000 mg | ORAL_TABLET | Freq: Every day | ORAL | Status: DC
  Administered 2017-09-03: 10 mg via ORAL
  Filled 2017-09-03 (×2): qty 1

## 2017-09-03 MED ORDER — DIAZEPAM 5 MG PO TABS
10.0000 mg | ORAL_TABLET | Freq: Two times a day (BID) | ORAL | Status: DC | PRN
  Administered 2017-09-03 – 2017-09-04 (×3): 10 mg via ORAL
  Filled 2017-09-03 (×3): qty 2

## 2017-09-03 MED ORDER — MILK AND MOLASSES ENEMA
1.0000 | Freq: Once | RECTAL | Status: AC
  Administered 2017-09-03: 250 mL via RECTAL

## 2017-09-03 MED ORDER — LORATADINE 10 MG PO TABS: 10.0000 mg | ORAL_TABLET | Freq: Every day | ORAL | Status: DC

## 2017-09-03 MED ORDER — LAMOTRIGINE 25 MG PO TABS
ORAL_TABLET | ORAL | Status: AC
  Filled 2017-09-03: qty 1

## 2017-09-03 NOTE — Progress Notes (Signed)
Pt arrived to 301 from ED. Oriented to room and equipment. PAS and skin swarm completed with Victorino Dike, Charity fundraiser. No skin issues noted. C/o 8/10 abd pain. Will check for available meds. No needs voiced at this time. Will continue to monitor.

## 2017-09-03 NOTE — Consult Note (Signed)
Reason for Consult: Partial small bowel obstruction Referring Physician: Dr. Selinda Flavin Douglas is an 57 y.o. female.  HPI: Patient is a 57 year old white female status post bilateral laparoscopic oophorectomy in February of 2019 who presents with a long-standing history of intermittent abdominal bloating and lower abdominal pain.  She is also had a decreased appetite.  She states that this is been going on for several years.  Since her recent surgery, she seems to have episodes of abdominal cramping and bloating.  This makes her nervous to eat.  She denies emesis.  Occasional nausea is noted.  She does have regular bowel movements and sometimes the pain is relieved with a bowel movement.  She denies any fever or chills.  She currently has a pain level of 2 out of 10.  Past Medical History:  Diagnosis Date  . ADHD (attention deficit hyperactivity disorder)   . Anxiety   . BV (bacterial vaginosis)   . Depression   . Headache   . Neuromuscular disorder ([place])    nerve damage C6  C7  . Restless leg syndrome   . TO (tubal occlusion)     Past Surgical History:  Procedure Laterality Date  . ABDOMINAL HYSTERECTOMY    . BACK SURGERY     spine surgery; 2 screws and plate in neck  . CESAREAN SECTION    . LAPAROSCOPIC BILATERAL SALPINGO OOPHERECTOMY Bilateral 06/10/2017   Procedure: LAPAROSCOPIC BILATERAL OOPHORECTOMY;  Surgeon: Christine Kind, MD;  Location: [facility];  Service: Gynecology;  Laterality: Bilateral;  . SHOULDER ARTHROSCOPY WITH ROTATOR CUFF REPAIR AND SUBACROMIAL DECOMPRESSION  06/04/2012   Procedure: SHOULDER ARTHROSCOPY WITH ROTATOR CUFF REPAIR AND SUBACROMIAL DECOMPRESSION;  Surgeon: Christine Lights, MD;  Location: [facility];  Service: Orthopedics;  Laterality: Left;  LEFT ARTHROSCOPY SHOULDER DECOMPRESSION SUBACROMIAL PARTIAL ACROMIOPLASTY WITH CORACOACROMIAL RELEASE, DISTAL CLAVICULECTOMY,  ROTATOR CUFF REPAIR     Family History  Problem Relation Age of Onset  .  Hyperlipidemia Mother   . Thyroid disease Mother   . Other Brother        suicide  . Bipolar disorder Son   . Cancer Other   . Dementia Maternal Aunt     Social History:  reports that she has quit smoking. Her smoking use included cigarettes. She has never used smokeless tobacco. She reports that she does not drink alcohol or use drugs.  Allergies:  Allergies  Allergen Reactions  . Anesthetics, Amide Anaphylaxis    Neck surgery pt unsure of exact anesthetic-afraid to have any further surgeries; Feels weird for a while  . Flexeril [Cyclobenzaprine] Swelling    Cannot take muscle relaxers -- swelling of legs and body     Medications: I have reviewed the patient's current medications.  Results for orders placed or performed during the hospital encounter of 09/02/17 (from the past 48 hour(s))  Urinalysis, Routine w reflex microscopic     Status: Abnormal   Collection Time: 09/02/17  4:57 PM  Result Value Ref Range   Color, Urine STRAW (A) YELLOW   APPearance CLEAR CLEAR   Specific Gravity, Urine 1.015 1.005 - 1.030   pH 7.0 5.0 - 8.0   Glucose, UA NEGATIVE NEGATIVE mg/dL   Hgb urine dipstick NEGATIVE NEGATIVE   Bilirubin Urine NEGATIVE NEGATIVE   Ketones, ur NEGATIVE NEGATIVE mg/dL   Protein, ur NEGATIVE NEGATIVE mg/dL   Nitrite NEGATIVE NEGATIVE   Leukocytes, UA NEGATIVE NEGATIVE    Comment: Performed at [facility], [address],  [place], [place] [address]  Comprehensive metabolic panel     Status: Abnormal   Collection Time: 09/02/17  5:16 PM  Result Value Ref Range   Sodium 142 135 - 145 mmol/L   Potassium 4.1 3.5 - 5.1 mmol/L   Chloride 105 101 - 111 mmol/L   CO2 29 22 - 32 mmol/L   Glucose, Bld 104 (H) 65 - 99 mg/dL   BUN 20 6 - 20 mg/dL   Creatinine, Ser 0.84 0.44 - 1.00 mg/dL   Calcium 9.0 8.9 - 10.3 mg/dL   Total Protein 7.3 6.5 - 8.1 g/dL   Albumin 4.2 3.5 - 5.0 g/dL   AST 25 15 - 41 U/L   ALT 20 14 - 54 U/L   Alkaline Phosphatase 86 38 - 126 U/L   Total  Bilirubin 0.5 0.3 - 1.2 mg/dL   GFR calc non Af Amer >60 >60 mL/min   GFR calc Af Amer >60 >60 mL/min    Comment: (NOTE) The eGFR has been calculated using the CKD EPI equation. This calculation has not been validated in all clinical situations. eGFR's persistently <60 mL/min signify possible Chronic Kidney Disease.    Anion gap 8 5 - 15    Comment: Performed at [facility], [address] [place], [place] [address]  CBC with Diff     Status: None   Collection Time: 09/02/17  5:16 PM  Result Value Ref Range   WBC 7.7 4.0 - 10.5 K/uL   RBC 4.34 3.87 - 5.11 MIL/uL   Hemoglobin 12.3 12.0 - 15.0 g/dL   HCT 38.9 36.0 - 46.0 %   MCV 89.6 78.0 - 100.0 fL   MCH 28.3 26.0 - 34.0 pg   MCHC 31.6 30.0 - 36.0 g/dL   RDW 12.9 11.5 - 15.5 %   Platelets 265 150 - 400 K/uL   Neutrophils Relative % 62 %   Neutro Abs 4.9 1.7 - 7.7 K/uL   Lymphocytes Relative 26 %   Lymphs Abs 2.0 0.7 - 4.0 K/uL   Monocytes Relative 8 %   Monocytes Absolute 0.6 0.1 - 1.0 K/uL   Eosinophils Relative 3 %   Eosinophils Absolute 0.2 0.0 - 0.7 K/uL   Basophils Relative 1 %   Basophils Absolute 0.1 0.0 - 0.1 K/uL    Comment: Performed at [facility], [address] [place], [place] [address]  Lipase, blood     Status: None   Collection Time: 09/02/17  8:46 PM  Result Value Ref Range   Lipase 39 11 - 51 U/L    Comment: Performed at [facility], [address] [place], [place] [address]  Magnesium     Status: None   Collection Time: 09/02/17  8:46 PM  Result Value Ref Range   Magnesium 2.0 1.7 - 2.4 mg/dL    Comment: Performed at [facility], [address], [place], [place] [address]  Lactic acid, plasma     Status: None   Collection Time: 09/02/17 10:36 PM  Result Value Ref Range   Lactic Acid, Venous 0.5 0.5 - 1.9 mmol/L    Comment: Performed at [facility], [address], [place], [place] [address]  CBC WITH DIFFERENTIAL     Status: Abnormal   Collection Time: 09/03/17  5:54 AM  Result Value Ref Range    WBC 6.2 4.0 - 10.5 K/uL   RBC 4.07 3.87 - 5.11 MIL/uL   Hemoglobin 11.6 (L) 12.0 - 15.0 g/dL   HCT 36.5 36.0 - 46.0 %  MCV 89.7 78.0 - 100.0 fL   MCH 28.5 26.0 - 34.0 pg   MCHC 31.8 30.0 - 36.0 g/dL   RDW 13.0 11.5 - 15.5 %   Platelets 227 150 - 400 K/uL   Neutrophils Relative % 51 %   Neutro Abs 3.1 1.7 - 7.7 K/uL   Lymphocytes Relative 38 %   Lymphs Abs 2.4 0.7 - 4.0 K/uL   Monocytes Relative 7 %   Monocytes Absolute 0.4 0.1 - 1.0 K/uL   Eosinophils Relative 3 %   Eosinophils Absolute 0.2 0.0 - 0.7 K/uL   Basophils Relative 1 %   Basophils Absolute 0.1 0.0 - 0.1 K/uL    Comment: Performed at [facility], [address], [place], [place] [address]  Basic metabolic panel     Status: Abnormal   Collection Time: 09/03/17  5:54 AM  Result Value Ref Range   Sodium 141 135 - 145 mmol/L   Potassium 3.8 3.5 - 5.1 mmol/L   Chloride 108 101 - 111 mmol/L   CO2 26 22 - 32 mmol/L   Glucose, Bld 85 65 - 99 mg/dL   BUN 15 6 - 20 mg/dL   Creatinine, Ser 0.77 0.44 - 1.00 mg/dL   Calcium 8.4 (L) 8.9 - 10.3 mg/dL   GFR calc non Af Amer >60 >60 mL/min   GFR calc Af Amer >60 >60 mL/min    Comment: (NOTE) The eGFR has been calculated using the CKD EPI equation. This calculation has not been validated in all clinical situations. eGFR's persistently <60 mL/min signify possible Chronic Kidney Disease.    Anion gap 7 5 - 15    Comment: Performed at [facility], [address], [place], [place] [address]    Ct Abdomen Pelvis W Contrast  Result Date: 09/02/2017 CLINICAL DATA:  Abdominal pain and distension. Urinary incontinence. Status post hysterectomy 2 months ago. EXAM: CT ABDOMEN AND PELVIS WITH CONTRAST TECHNIQUE: Multidetector CT imaging of the abdomen and pelvis was performed using the standard protocol following bolus administration of intravenous contrast. CONTRAST:  149m ISOVUE-300 IOPAMIDOL (ISOVUE-300) INJECTION 61% COMPARISON:  None. FINDINGS: Lower chest: Mildly prominent  pulmonary vasculature. No pleural fluid. Hepatobiliary: No focal liver abnormality is seen. No gallstones, gallbladder wall thickening, or biliary dilatation. Pancreas: Unremarkable. No pancreatic ductal dilatation or surrounding inflammatory changes. Spleen: Normal in size without focal abnormality. Adrenals/Urinary Tract: Adrenal glands are unremarkable. Kidneys are normal, without renal calculi, focal lesion, or hydronephrosis. Bladder is unremarkable. Stomach/Bowel: Multiple loops of dilated jejunum in the left mid abdomen with a transition to normal caliber small bowel in the left mid abdomen. The stomach and small bowel proximal to these loops are normal in caliber. Prominent stool and gas in normal caliber colon. No evidence of appendicitis. Vascular/Lymphatic: No significant vascular findings are present. No enlarged abdominal or pelvic lymph nodes. Reproductive: Status post hysterectomy. No adnexal masses. Other: No abdominal wall hernia seen. Musculoskeletal: Unremarkable bones. IMPRESSION: Partial small bowel obstruction with a transition in the left mid abdomen. This has an appearance suggesting a closed loop obstruction, possibly due to an internal hernia. Adhesions are also possible. Electronically Signed   By: SClaudie ReveringM.D.   On: 09/02/2017 21:29    ROS:  Pertinent items are noted in HPI.  Blood pressure 102/71, pulse 68, temperature 97.8 F (36.6 C), temperature source Oral, resp. rate 16, height '5\' 4"'  (1.626 m), weight 122 lb (55.3 kg), SpO2 100 %. Physical Exam: Well-developed well-nourished white female no acute distress Head  is normocephalic, atraumatic Lungs clear to auscultation with equal breath sounds bilaterally Heart examination reveals regular rate and rhythm without S3, S4, murmurs Abdomen is soft with slight distention noted.  No rigidity is noted.  Some discomfort to palpation in the left lower quadrant.  No masses are noted.  No hernias are noted.  Well-healed surgical  scars noted.  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  She does not want surgical intervention unless absolutely necessary.  We may try to treat this in a similar fashion to IBS.  Will advance to clear liquid diet.  Aviva Signs 09/03/2017, 9:50 AM

## 2017-09-03 NOTE — Progress Notes (Signed)
PROGRESS NOTE    Christine Douglas  ZOX:[id number] DOB: Sep 05, 1960 DOA: 09/02/2017 PCP: Renaye Rakers, MD    Brief Narrative:  57 year old female with nasal hospital with abdominal pain.  Imaging indicated partial small bowel obstruction.  Letter to the hospital for further treatment.  General surgery following.   Assessment & Plan:   Principal Problem:   SBO (small bowel obstruction) (HCC) Active Problems:   Anxiety   Stress incontinence   1. Partial small bowel obstruction.  No vomiting at this time.  She is on laxatives/enemas.  Had small bowel movement and is having some flatus.  Currently on clear liquids.  General surgery following. 2. Anxiety/bipolar 1 disorder.  Reports that she was recently started on regimen of lamotrigine and diazepam.  We will continue current treatments. 3. Urinary incontinence.  Reports having incontinence since hysterectomy.  Will likely need to follow-up with gynecology   DVT prophylaxis: Heparin Code Status: Full code Family Communication: No family present Disposition Plan: Discharge home once improved   Consultants:   General surgery  Procedures:     Antimicrobials:      Subjective: Had small bowel movement earlier today.  Passed gas twice today.  No vomiting  Objective: Vitals:   09/02/17 2341 09/03/17 0417 09/03/17 0631 09/03/17 1300  BP: 113/87 90/63 102/71 99/62  Pulse: 73 69 68 62  Resp:    16  Temp: 97.9 F (36.6 C) 97.8 F (36.6 C)  97.8 F (36.6 C)  TempSrc: Oral Oral  Oral  SpO2: 100% 100%  100%  Weight:      Height:        Intake/Output Summary (Last 24 hours) at 09/03/2017 1938 Last data filed at 09/03/2017 1900 Gross per 24 hour  Intake 4208.33 ml  Output -  Net 4208.33 ml   Filed Weights   09/02/17 1649  Weight: 55.3 kg (122 lb)    Examination:  General exam: Appears calm and comfortable  Respiratory system: Clear to auscultation. Respiratory effort normal. Cardiovascular system: S1 & S2 heard, RRR. No  JVD, murmurs, rubs, gallops or clicks. No pedal edema. Gastrointestinal system: Abdomen is nondistended, soft and tender in lower abdomen. No organomegaly or masses felt. Normal bowel sounds heard. Central nervous system: Alert and oriented. No focal neurological deficits. Extremities: Symmetric 5 x 5 power. Skin: No rashes, lesions or ulcers Psychiatry: Judgement and insight appear normal. Mood & affect appropriate.     Data Reviewed: I have personally reviewed following labs and imaging studies  CBC: Recent Labs  Lab 09/02/17 1716 09/03/17 0554  WBC 7.7 6.2  NEUTROABS 4.9 3.1  HGB 12.3 11.6*  HCT 38.9 36.5  MCV 89.6 89.7  PLT 265 227   Basic Metabolic Panel: Recent Labs  Lab 09/02/17 1716 09/02/17 2046 09/03/17 0554  NA 142  --  141  K 4.1  --  3.8  CL 105  --  108  CO2 29  --  26  GLUCOSE 104*  --  85  BUN 20  --  15  CREATININE 0.84  --  0.77  CALCIUM 9.0  --  8.4*  MG  --  2.0  --    GFR: Estimated Creatinine Clearance: 69.4 mL/min (by C-G formula based on SCr of 0.77 mg/dL). Liver Function Tests: Recent Labs  Lab 09/02/17 1716  AST 25  ALT 20  ALKPHOS 86  BILITOT 0.5  PROT 7.3  ALBUMIN 4.2   Recent Labs  Lab 09/02/17 2046  LIPASE 39   No results  for input(s): AMMONIA in the last 168 hours. Coagulation Profile: No results for input(s): INR, PROTIME in the last 168 hours. Cardiac Enzymes: No results for input(s): CKTOTAL, CKMB, CKMBINDEX, TROPONINI in the last 168 hours. BNP (last 3 results) No results for input(s): PROBNP in the last 8760 hours. HbA1C: No results for input(s): HGBA1C in the last 72 hours. CBG: No results for input(s): GLUCAP in the last 168 hours. Lipid Profile: No results for input(s): CHOL, HDL, LDLCALC, TRIG, CHOLHDL, LDLDIRECT in the last 72 hours. Thyroid Function Tests: No results for input(s): TSH, T4TOTAL, FREET4, T3FREE, THYROIDAB in the last 72 hours. Anemia Panel: No results for input(s): VITAMINB12, FOLATE,  FERRITIN, TIBC, IRON, RETICCTPCT in the last 72 hours. Sepsis Labs: Recent Labs  Lab 09/02/17 2236  LATICACIDVEN 0.5    No results found for this or any previous visit (from the past 240 hour(s)).       Radiology Studies: Ct Abdomen Pelvis W Contrast  Result Date: 09/02/2017 CLINICAL DATA:  Abdominal pain and distension. Urinary incontinence. Status post hysterectomy 2 months ago. EXAM: CT ABDOMEN AND PELVIS WITH CONTRAST TECHNIQUE: Multidetector CT imaging of the abdomen and pelvis was performed using the standard protocol following bolus administration of intravenous contrast. CONTRAST:  ISOVUE-300 IOPAMIDOL (ISOVUE-300) INJECTION 61% COMPARISON:  None. FINDINGS: Lower chest: Mildly prominent pulmonary vasculature. No pleural fluid. Hepatobiliary: No focal liver abnormality is seen. No gallstones, gallbladder wall thickening, or biliary dilatation. Pancreas: Unremarkable. No pancreatic ductal dilatation or surrounding inflammatory changes. Spleen: Normal in size without focal abnormality. Adrenals/Urinary Tract: Adrenal glands are unremarkable. Kidneys are normal, without renal calculi, focal lesion, or hydronephrosis. Bladder is unremarkable. Stomach/Bowel: Multiple loops of dilated jejunum in the left mid abdomen with a transition to normal caliber small bowel in the left mid abdomen. The stomach and small bowel proximal to these loops are normal in caliber. Prominent stool and gas in normal caliber colon. No evidence of appendicitis. Vascular/Lymphatic: No significant vascular findings are present. No enlarged abdominal or pelvic lymph nodes. Reproductive: Status post hysterectomy. No adnexal masses. Other: No abdominal wall hernia seen. Musculoskeletal: Unremarkable bones. IMPRESSION: Partial small bowel obstruction with a transition in the left mid abdomen. This has an appearance suggesting a closed loop obstruction, possibly due to an internal hernia. Adhesions are also possible.  Electronically Signed   By: Beckie Salts M.D.   On: 09/02/2017 21:29        Scheduled Meds: . heparin  5,000 Units Subcutaneous Q8H  . lamoTRIgine  25 mg Oral TID   Continuous Infusions: . sodium chloride 125 mL/hr at 09/03/17 0958  . famotidine (PEPCID) IV Stopped (09/03/17 1045)     LOS: 1 day    Time spent: Greater than 50% of this time spent in direct contact with patient discussing bowel obstruction as well as recent urinary incontinence.  We also discussed anxiety and bipolar disorder as well as her new medication regimen    Erick Blinks, MD [facility] Pager [phone]  If 7PM-7AM, please contact night-coverage [email or web address] Password [email or web address] 09/03/2017, 7:38 PM

## 2017-09-04 DIAGNOSIS — F3130 Bipolar disorder, current episode depressed, mild or moderate severity, unspecified: Secondary | ICD-10-CM

## 2017-09-04 LAB — CBC WITH DIFFERENTIAL/PLATELET
BASOS ABS: 0.1 10*3/uL (ref 0.0–0.1)
BASOS PCT: 1 %
Eosinophils Absolute: 0.2 10*3/uL (ref 0.0–0.7)
Eosinophils Relative: 2 %
HEMATOCRIT: 34.3 % — AB (ref 36.0–46.0)
HEMOGLOBIN: 11.2 g/dL — AB (ref 12.0–15.0)
Lymphocytes Relative: 33 %
Lymphs Abs: 2 10*3/uL (ref 0.7–4.0)
MCH: 29.6 pg (ref 26.0–34.0)
MCHC: 32.7 g/dL (ref 30.0–36.0)
MCV: 90.7 fL (ref 78.0–100.0)
MONOS PCT: 6 %
Monocytes Absolute: 0.4 10*3/uL (ref 0.1–1.0)
NEUTROS ABS: 3.5 10*3/uL (ref 1.7–7.7)
NEUTROS PCT: 58 %
Platelets: 191 10*3/uL (ref 150–400)
RBC: 3.78 MIL/uL — ABNORMAL LOW (ref 3.87–5.11)
RDW: 13 % (ref 11.5–15.5)
WBC: 6.1 10*3/uL (ref 4.0–10.5)

## 2017-09-04 LAB — BASIC METABOLIC PANEL
ANION GAP: 9 (ref 5–15)
BUN: 8 mg/dL (ref 6–20)
CHLORIDE: 110 mmol/L (ref 101–111)
CO2: 23 mmol/L (ref 22–32)
Calcium: 8.4 mg/dL — ABNORMAL LOW (ref 8.9–10.3)
Creatinine, Ser: 0.75 mg/dL (ref 0.44–1.00)
GFR calc non Af Amer: >60 mL/min (ref >60)
Glucose, Bld: 94 mg/dL (ref 65–99)
Potassium: 4.1 mmol/L (ref 3.5–5.1)
Sodium: 142 mmol/L (ref 135–145)

## 2017-09-04 LAB — HIV ANTIBODY (ROUTINE TESTING W REFLEX): HIV Screen 4th Generation wRfx: NONREACTIVE

## 2017-09-04 MED ORDER — MILK AND MOLASSES ENEMA: 1.0000 | Freq: Once | RECTAL | Status: DC

## 2017-09-04 MED ORDER — POLYETHYLENE GLYCOL 3350 17 G PO PACK
17.0000 g | PACK | Freq: Two times a day (BID) | ORAL | Status: DC
  Administered 2017-09-04: 17 g via ORAL
  Filled 2017-09-04: qty 1

## 2017-09-04 MED ORDER — GUAIFENESIN ER 600 MG PO TB12
600.0000 mg | ORAL_TABLET | Freq: Two times a day (BID) | ORAL | Status: DC
  Administered 2017-09-04 – 2017-09-05 (×3): 600 mg via ORAL
  Filled 2017-09-04 (×3): qty 1

## 2017-09-04 MED ORDER — POLYETHYLENE GLYCOL 3350 17 G PO PACK
17.0000 g | PACK | ORAL | Status: AC
  Administered 2017-09-04 (×6): 17 g via ORAL
  Filled 2017-09-04 (×6): qty 1

## 2017-09-04 NOTE — Progress Notes (Signed)
PROGRESS NOTE    Christine Douglas  ZOX:[id number] DOB: [birth year] DOA: 09/02/2017 PCP: Renaye Rakers, MD    Brief Narrative:  57 year old female with nasal hospital with abdominal pain.  Imaging indicated partial small bowel obstruction. Admitted to the hospital for further treatment.  General surgery following.   Assessment & Plan:   Principal Problem:   SBO (small bowel obstruction) (HCC) Active Problems:   Bipolar I disorder, most recent episode depressed (HCC)   Anxiety   Stress incontinence   1. Partial small bowel obstruction.  No vomiting at this time.  She is on laxatives/enemas.  No significant bowel movement since yesterday. Discussed with Dr. Lovell Sheehan, will increase miralax dosing and give another enema today. 2. Anxiety/bipolar 1 disorder.  Reports that she was recently started on regimen of lamotrigine and diazepam.  Continue in the hospital. 3. Urinary incontinence.  Reports having incontinence since hysterectomy.  Discussed with Dr. Emelda Fear. Recommended starting patient on detrol LA  po daily. Follow up with GYN after discharge   DVT prophylaxis: Heparin Code Status: Full code Family Communication: No family present Disposition Plan: Discharge home once improved   Consultants:   General surgery  Procedures:     Antimicrobials:      Subjective: No bowel movement since enema yesterday. No vomiting.   Objective: Vitals:   09/03/17 1300 09/03/17 2024 09/04/17 0409 09/04/17 1457  BP: 99/62 103/63 104/77 110/72  Pulse: 62 76 82 (!) 113  Resp: 16   16  Temp: 97.8 F (36.6 C) (!) 97.3 F (36.3 C) 97.6 F (36.4 C)   TempSrc: Oral Oral Oral   SpO2: 100% 100% 99% (!) 83%  Weight:      Height:        Intake/Output Summary (Last 24 hours) at 09/04/2017 1713 Last data filed at 09/04/2017 1038 Gross per 24 hour  Intake 1580 ml  Output 1 ml  Net 1579 ml   Filed Weights   09/02/17 1649  Weight: 55.3 kg (122 lb)    Examination:  General exam: Alert,  awake, oriented x 3 Respiratory system: Clear to auscultation. Respiratory effort normal. Cardiovascular system:RRR. No murmurs, rubs, gallops. Gastrointestinal system: Abdomen is nondistended, soft and nontender. No organomegaly or masses felt. Normal bowel sounds heard. Central nervous system: Alert and oriented. No focal neurological deficits. Extremities: No C/C/E, +pedal pulses Skin: No rashes, lesions or ulcers Psychiatry: Judgement and insight appear normal. Mood & affect appropriate.   Data Reviewed: I have personally reviewed following labs and imaging studies  CBC: Recent Labs  Lab 09/02/17 1716 09/03/17 0554 09/04/17 0505  WBC 7.7 6.2 6.1  NEUTROABS 4.9 3.1 3.5  HGB 12.3 11.6* 11.2*  HCT 38.9 36.5 34.3*  MCV 89.6 89.7 90.7  PLT 265 227 191   Basic Metabolic Panel: Recent Labs  Lab 09/02/17 1716 09/02/17 2046 09/03/17 0554 09/04/17 0505  NA 142  --  141 142  K 4.1  --  3.8 4.1  CL 105  --  108 110  CO2 29  --  26 23  GLUCOSE 104*  --  85 94  BUN 20  --  15 8  CREATININE 0.84  --  0.77 0.75  CALCIUM 9.0  --  8.4* 8.4*  MG  --  2.0  --   --    GFR: Estimated Creatinine Clearance: 69.4 mL/min (by C-G formula based on SCr of 0.75 mg/dL). Liver Function Tests: Recent Labs  Lab 09/02/17 1716  AST 25  ALT 20  ALKPHOS 86  BILITOT 0.5  PROT 7.3  ALBUMIN 4.2   Recent Labs  Lab 09/02/17 2046  LIPASE 39   No results for input(s): AMMONIA in the last 168 hours. Coagulation Profile: No results for input(s): INR, PROTIME in the last 168 hours. Cardiac Enzymes: No results for input(s): CKTOTAL, CKMB, CKMBINDEX, TROPONINI in the last 168 hours. BNP (last 3 results) No results for input(s): PROBNP in the last 8760 hours. HbA1C: No results for input(s): HGBA1C in the last 72 hours. CBG: No results for input(s): GLUCAP in the last 168 hours. Lipid Profile: No results for input(s): CHOL, HDL, LDLCALC, TRIG, CHOLHDL, LDLDIRECT in the last 72 hours. Thyroid  Function Tests: No results for input(s): TSH, T4TOTAL, FREET4, T3FREE, THYROIDAB in the last 72 hours. Anemia Panel: No results for input(s): VITAMINB12, FOLATE, FERRITIN, TIBC, IRON, RETICCTPCT in the last 72 hours. Sepsis Labs: Recent Labs  Lab 09/02/17 2236  LATICACIDVEN 0.5    No results found for this or any previous visit (from the past 240 hour(s)).       Radiology Studies: Ct Abdomen Pelvis W Contrast  Result Date: 09/02/2017 CLINICAL DATA:  Abdominal pain and distension. Urinary incontinence. Status post hysterectomy 2 months ago. EXAM: CT ABDOMEN AND PELVIS WITH CONTRAST TECHNIQUE: Multidetector CT imaging of the abdomen and pelvis was performed using the standard protocol following bolus administration of intravenous contrast. CONTRAST:  ISOVUE-300 IOPAMIDOL (ISOVUE-300) INJECTION 61% COMPARISON:  None. FINDINGS: Lower chest: Mildly prominent pulmonary vasculature. No pleural fluid. Hepatobiliary: No focal liver abnormality is seen. No gallstones, gallbladder wall thickening, or biliary dilatation. Pancreas: Unremarkable. No pancreatic ductal dilatation or surrounding inflammatory changes. Spleen: Normal in size without focal abnormality. Adrenals/Urinary Tract: Adrenal glands are unremarkable. Kidneys are normal, without renal calculi, focal lesion, or hydronephrosis. Bladder is unremarkable. Stomach/Bowel: Multiple loops of dilated jejunum in the left mid abdomen with a transition to normal caliber small bowel in the left mid abdomen. The stomach and small bowel proximal to these loops are normal in caliber. Prominent stool and gas in normal caliber colon. No evidence of appendicitis. Vascular/Lymphatic: No significant vascular findings are present. No enlarged abdominal or pelvic lymph nodes. Reproductive: Status post hysterectomy. No adnexal masses. Other: No abdominal wall hernia seen. Musculoskeletal: Unremarkable bones. IMPRESSION: Partial small bowel obstruction with a  transition in the left mid abdomen. This has an appearance suggesting a closed loop obstruction, possibly due to an internal hernia. Adhesions are also possible. Electronically Signed   By: Beckie Salts M.D.   On: 09/02/2017 21:29        Scheduled Meds: . guaiFENesin  600 mg Oral BID  . heparin  5,000 Units Subcutaneous Q8H  . lamoTRIgine  25 mg Oral TID  . loratadine  10 mg Oral Daily  . milk and molasses  1 enema Rectal Once  . polyethylene glycol  17 g Oral Q1 Hr x 6   Continuous Infusions: . sodium chloride 10 mL/hr at 09/04/17 0455  . famotidine (PEPCID) IV Stopped (09/04/17 1038)     LOS: 2 days    Time spent:    Erick Blinks, MD [facility] Pager [phone]  If 7PM-7AM, please contact night-coverage [email or web address] Password [email or web address] 09/04/2017, 5:13 PM

## 2017-09-04 NOTE — Progress Notes (Signed)
Pt verbalized feeling swollen. Unable to get rings off. No apparent swelling noted otherwise. NS infusing at 112ml.hr. Lungs sounds clear. Verbalizes good urine output. Labs WNL. Sharl Ma, MD notified. Instructed to decreased IVF to [facility]. Will continue to monitor.

## 2017-09-04 NOTE — Progress Notes (Signed)
  Subjective: Patient had mild results with enema.  States she still feels bloated but is back to her baseline.  Objective: Vital signs in last 24 hours: Temp:  [97.3 F (36.3 C)-97.8 F (36.6 C)] 97.6 F (36.4 C) (05/02 0409) Pulse Rate:  [62-82] 82 (05/02 0409) Resp:  [16] 16 (05/01 1300) BP: (99-104)/(62-77) 104/77 (05/02 0409) SpO2:  [99 %-100 %] 99 % (05/02 0409) Last BM Date: 09/02/17  Intake/Output from previous day: 05/01 0701 - 05/02 0700 In: 3700 [P.O.:1200; I.V.:2400; IV Piggyback:100] Out: -  Intake/Output this shift: Total I/O In: -  Out: 1 [Urine:1]  General appearance: alert, cooperative and no distress GI: Soft, mildly distended but not rigid.  Bowel sounds present.  Lab Results:  Recent Labs    09/03/17 0554 09/04/17 0505  WBC 6.2 6.1  HGB 11.6* 11.2*  HCT 36.5 34.3*  PLT 227 191   BMET Recent Labs    09/03/17 0554 09/04/17 0505  NA 141 142  K 3.8 4.1  CL 108 110  CO2 26 23  GLUCOSE 85 94  BUN 15 8  CREATININE 0.77 0.75  CALCIUM 8.4* 8.4*   PT/INR No results for input(s): LABPROT, INR in the last 72 hours.  Studies/Results: Ct Abdomen Pelvis W Contrast  Result Date: 09/02/2017 CLINICAL DATA:  Abdominal pain and distension. Urinary incontinence. Status post hysterectomy 2 months ago. EXAM: CT ABDOMEN AND PELVIS WITH CONTRAST TECHNIQUE: Multidetector CT imaging of the abdomen and pelvis was performed using the standard protocol following bolus administration of intravenous contrast. CONTRAST:  ISOVUE-300 IOPAMIDOL (ISOVUE-300) INJECTION 61% COMPARISON:  None. FINDINGS: Lower chest: Mildly prominent pulmonary vasculature. No pleural fluid. Hepatobiliary: No focal liver abnormality is seen. No gallstones, gallbladder wall thickening, or biliary dilatation. Pancreas: Unremarkable. No pancreatic ductal dilatation or surrounding inflammatory changes. Spleen: Normal in size without focal abnormality. Adrenals/Urinary Tract: Adrenal glands are  unremarkable. Kidneys are normal, without renal calculi, focal lesion, or hydronephrosis. Bladder is unremarkable. Stomach/Bowel: Multiple loops of dilated jejunum in the left mid abdomen with a transition to normal caliber small bowel in the left mid abdomen. The stomach and small bowel proximal to these loops are normal in caliber. Prominent stool and gas in normal caliber colon. No evidence of appendicitis. Vascular/Lymphatic: No significant vascular findings are present. No enlarged abdominal or pelvic lymph nodes. Reproductive: Status post hysterectomy. No adnexal masses. Other: No abdominal wall hernia seen. Musculoskeletal: Unremarkable bones. IMPRESSION: Partial small bowel obstruction with a transition in the left mid abdomen. This has an appearance suggesting a closed loop obstruction, possibly due to an internal hernia. Adhesions are also possible. Electronically Signed   By: Beckie Salts M.D.   On: 09/02/2017 21:29    Anti-infectives: Anti-infectives (From admission, onward)   None      Assessment/Plan: Impression: Partial small bowel obstruction, chronic in nature.  Patient states that she has had this issue for many years, though it was slightly increased after her recent surgery.  She has been on MiraLAX in the past and I will reorder this.  She does not want surgical intervention at this time, which is fine with me.  Will advance her diet as tolerated.  Will follow her as an outpatient on discharge.  Anticipate discharge in next 24 to 48 hours.  LOS: 2 days    Franky Macho 09/04/2017

## 2017-09-05 ENCOUNTER — Ambulatory Visit: Payer: BLUE CROSS/BLUE SHIELD | Admitting: Obstetrics and Gynecology

## 2017-09-05 LAB — CBC WITH DIFFERENTIAL/PLATELET
BASOS PCT: 1 %
Basophils Absolute: 0.1 10*3/uL (ref 0.0–0.1)
Eosinophils Absolute: 0.2 10*3/uL (ref 0.0–0.7)
Eosinophils Relative: 3 %
HCT: 36.6 % (ref 36.0–46.0)
HEMOGLOBIN: 11.9 g/dL — AB (ref 12.0–15.0)
Lymphocytes Relative: 34 %
Lymphs Abs: 2.3 10*3/uL (ref 0.7–4.0)
MCH: 29.1 pg (ref 26.0–34.0)
MCHC: 32.5 g/dL (ref 30.0–36.0)
MCV: 89.5 fL (ref 78.0–100.0)
Monocytes Absolute: 0.6 10*3/uL (ref 0.1–1.0)
Monocytes Relative: 8 %
NEUTROS PCT: 54 %
Neutro Abs: 3.8 10*3/uL (ref 1.7–7.7)
Platelets: 243 10*3/uL (ref 150–400)
RBC: 4.09 MIL/uL (ref 3.87–5.11)
RDW: 12.6 % (ref 11.5–15.5)
WBC: 7 10*3/uL (ref 4.0–10.5)

## 2017-09-05 LAB — BASIC METABOLIC PANEL
ANION GAP: 10 (ref 5–15)
BUN: 14 mg/dL (ref 6–20)
CHLORIDE: 103 mmol/L (ref 101–111)
CO2: 27 mmol/L (ref 22–32)
CREATININE: 0.96 mg/dL (ref 0.44–1.00)
Calcium: 8.7 mg/dL — ABNORMAL LOW (ref 8.9–10.3)
GFR calc non Af Amer: >60 mL/min (ref >60)
GLUCOSE: 100 mg/dL — AB (ref 65–99)
Potassium: 4.6 mmol/L (ref 3.5–5.1)
Sodium: 140 mmol/L (ref 135–145)

## 2017-09-05 MED ORDER — GUAIFENESIN ER 600 MG PO TB12: 600.0000 mg | ORAL_TABLET | Freq: Two times a day (BID) | ORAL | 0 refills | Status: DC

## 2017-09-05 MED ORDER — LINACLOTIDE 145 MCG PO CAPS: 290.0000 ug | ORAL_CAPSULE | Freq: Every day | ORAL | Status: DC

## 2017-09-05 MED ORDER — KETOROLAC TROMETHAMINE 30 MG/ML IJ SOLN: 30.0000 mg | Freq: Once | INTRAMUSCULAR | Status: DC

## 2017-09-05 MED ORDER — TOLTERODINE TARTRATE ER 2 MG PO CP24: 2.0000 mg | ORAL_CAPSULE | Freq: Every day | ORAL | 0 refills | Status: DC

## 2017-09-05 MED ORDER — POLYETHYLENE GLYCOL 3350 17 G PO PACK: 17.0000 g | PACK | Freq: Every day | ORAL | 0 refills | Status: DC

## 2017-09-05 MED ORDER — LINACLOTIDE 145 MCG PO CAPS: 145.0000 ug | ORAL_CAPSULE | Freq: Every day | ORAL | Status: DC

## 2017-09-05 MED ORDER — LORATADINE 10 MG PO TABS: 10.0000 mg | ORAL_TABLET | Freq: Every day | ORAL | 0 refills | Status: DC

## 2017-09-05 MED ORDER — LINACLOTIDE 290 MCG PO CAPS: 290.0000 ug | ORAL_CAPSULE | Freq: Every day | ORAL | 30 refills | Status: DC

## 2017-09-05 NOTE — Progress Notes (Signed)
  Subjective: Patient having waxing and waning abdominal cramping.  No nausea or vomiting have been noted.  She is also complaining of a headache.  Objective: Vital signs in last 24 hours: Temp:  [98.9 F (37.2 C)-99.4 F (37.4 C)] 99.4 F (37.4 C) (05/03 0547) Pulse Rate:  [81-113] 81 (05/03 0547) Resp:  [16] 16 (05/03 0547) BP: (109-113)/(59-74) 113/74 (05/03 0547) SpO2:  [83 %-99 %] 99 % (05/03 0547) Last BM Date: 09/02/17  Intake/Output from previous day: 05/02 0701 - 05/03 0700 In: 820 [P.O.:720; IV Piggyback:100] Out: 1 [Urine:1] Intake/Output this shift: No intake/output data recorded.  General appearance: alert, cooperative and no distress GI: Soft without specific point tenderness.  Bowel sounds present.  Lab Results:  Recent Labs    09/04/17 0505 09/05/17 0530  WBC 6.1 7.0  HGB 11.2* 11.9*  HCT 34.3* 36.6  PLT 191 243   BMET Recent Labs    09/04/17 0505 09/05/17 0530  NA 142 140  K 4.1 4.6  CL 110 103  CO2 23 27  GLUCOSE 94 100*  BUN 8 14  CREATININE 0.75 0.96  CALCIUM 8.4* 8.7*   PT/INR No results for input(s): LABPROT, INR in the last 72 hours.  Studies/Results: No results found.  Anti-infectives: Anti-infectives (From admission, onward)   None      Assessment/Plan: Impression: Abdominal cramping with small bowel dysfunction.  Patient does not have a complete small bowel obstruction, thus no need for acute surgical intervention at this time. Plan: I did discuss with the patient that we could start her on Linzess as she does have symptoms of IBS.  She understands that and agrees.  She wants to go home today.  I will see her in 1 month for follow-up.  LOS: 3 days    Franky Macho 09/05/2017

## 2017-09-05 NOTE — Progress Notes (Signed)
IV removed, WNL. D/C instructions given to pt. Verbalized understanding. Pt family member to pick up at front entrance.

## 2017-09-05 NOTE — Discharge Summary (Signed)
Physician Discharge Summary  Christine Douglas JYN:[id number] DOB: Jul 16, 1960 DOA: 09/02/2017  PCP: Renaye Rakers, MD  Admit date: 09/02/2017 Discharge date: 09/05/2017  Admitted From: Home Disposition: Home  Recommendations for Outpatient Follow-up:  1. Follow up with PCP in 1-2 weeks 2. Please obtain BMP/CBC in one week 3. Follow-up with Dr. Lovell Sheehan in 4 weeks 4. Follow-up with Dr. Emelda Fear in 4 weeks  Home Health: Equipment/Devices:  Discharge Condition: Stable CODE STATUS: Full code Diet recommendation: Heart Healthy   Brief/Interim Summary: 57 year old female with nasal hospital with abdominal pain.  Imaging indicated partial small bowel obstruction. Admitted to the hospital for further treatment.  General surgery following.  Discharge Diagnoses:  Principal Problem:   SBO (small bowel obstruction) (HCC) Active Problems:   Bipolar I disorder, most recent episode depressed (HCC)   Anxiety   Stress incontinence  1. Partial small bowel obstruction.    Seen by general surgery.  She does is not felt to be obstructed at this time.  Tolerating solid diet without any vomiting.  Started on Linzess for constipation with IBS symptoms.  Continue on MiraLAX.  Cleared for discharge by general surgery.  We will follow-up with Dr. Lovell Sheehan in 4 weeks. 2. Anxiety/bipolar 1 disorder.  Reports that she was recently started on regimen of lamotrigine and diazepam.    This was continued on the hospital 3. Urinary incontinence.  Reports having incontinence since hysterectomy.  Discussed with Dr. Emelda Fear. Recommended starting patient on detrol LA  po daily. Follow up with GYN after discharge     Discharge Instructions  Discharge Instructions    Diet - low sodium heart healthy   Complete by:  As directed    Increase activity slowly   Complete by:  As directed      Allergies as of 09/05/2017      Reactions   Anesthetics, Amide Anaphylaxis   Neck surgery pt unsure of exact anesthetic-afraid to have  any further surgeries; Feels weird for a while   Flexeril [cyclobenzaprine] Swelling   Cannot take muscle relaxers -- swelling of legs and body       Medication List    TAKE these medications   diazepam 10 MG tablet Commonly known as:  VALIUM Take 1 tablet (10 mg total) by mouth every 12 (twelve) hours as needed for anxiety.   guaiFENesin 600 MG 12 hr tablet Commonly known as:  MUCINEX Take 1 tablet (600 mg total) by mouth 2 (two) times daily.   lamoTRIgine 25 MG tablet Commonly known as:  LAMICTAL Take 25 mg by mouth 3 (three) times daily.   linaclotide 290 MCG Caps capsule Commonly known as:  LINZESS Take 1 capsule (290 mcg total) by mouth daily before breakfast.   loratadine 10 MG tablet Commonly known as:  CLARITIN Take 1 tablet (10 mg total) by mouth daily. Start taking on:  09/06/2017   polyethylene glycol packet Commonly known as:  MIRALAX Take 17 g by mouth daily.   tolterodine 2 MG 24 hr capsule Commonly known as:  DETROL LA Take 1 capsule (2 mg total) by mouth daily.      Follow-up Information    Franky Macho, MD Follow up in 4 week(s).   Specialty:  General Surgery Contact information: [address] [place] [address] [phone]        Tilda Burrow, MD. Schedule an appointment as soon as possible for a visit in 4 week(s).   Specialties:  Obstetrics and Gynecology, Radiology Contact information: [address]  [place] [address] [phone]          Allergies  Allergen Reactions  . Anesthetics, Amide Anaphylaxis    Neck surgery pt unsure of exact anesthetic-afraid to have any further surgeries; Feels weird for a while  . Flexeril [Cyclobenzaprine] Swelling    Cannot take muscle relaxers -- swelling of legs and body     Consultations:  General surgery   Procedures/Studies: Ct Abdomen Pelvis W Contrast  Result Date: 09/02/2017 CLINICAL DATA:  Abdominal pain and distension. Urinary incontinence. Status post  hysterectomy 2 months ago. EXAM: CT ABDOMEN AND PELVIS WITH CONTRAST TECHNIQUE: Multidetector CT imaging of the abdomen and pelvis was performed using the standard protocol following bolus administration of intravenous contrast. CONTRAST:  ISOVUE-300 IOPAMIDOL (ISOVUE-300) INJECTION 61% COMPARISON:  None. FINDINGS: Lower chest: Mildly prominent pulmonary vasculature. No pleural fluid. Hepatobiliary: No focal liver abnormality is seen. No gallstones, gallbladder wall thickening, or biliary dilatation. Pancreas: Unremarkable. No pancreatic ductal dilatation or surrounding inflammatory changes. Spleen: Normal in size without focal abnormality. Adrenals/Urinary Tract: Adrenal glands are unremarkable. Kidneys are normal, without renal calculi, focal lesion, or hydronephrosis. Bladder is unremarkable. Stomach/Bowel: Multiple loops of dilated jejunum in the left mid abdomen with a transition to normal caliber small bowel in the left mid abdomen. The stomach and small bowel proximal to these loops are normal in caliber. Prominent stool and gas in normal caliber colon. No evidence of appendicitis. Vascular/Lymphatic: No significant vascular findings are present. No enlarged abdominal or pelvic lymph nodes. Reproductive: Status post hysterectomy. No adnexal masses. Other: No abdominal wall hernia seen. Musculoskeletal: Unremarkable bones. IMPRESSION: Partial small bowel obstruction with a transition in the left mid abdomen. This has an appearance suggesting a closed loop obstruction, possibly due to an internal hernia. Adhesions are also possible. Electronically Signed   By: Beckie Salts M.D.   On: 09/02/2017 21:29        Subjective: Has some headache which has improved with Tylenol, having flatus, last movement was yesterday.  No vomiting, tolerating diet  Discharge Exam: Vitals:   09/04/17 2114 09/05/17 0547  BP: (!) 109/59 113/74  Pulse: 84 81  Resp: 16 16  Temp: 98.9 F (37.2 C) 99.4 F (37.4 C)   SpO2: 99% 99%   Vitals:   09/04/17 0409 09/04/17 1457 09/04/17 2114 09/05/17 0547  BP: 104/77 110/72 (!) 109/59 113/74  Pulse: 82 (!) 113 84 81  Resp:  Temp: 97.6 F (36.4 C)  98.9 F (37.2 C) 99.4 F (37.4 C)  TempSrc: Oral  Oral Oral  SpO2: 99% (!) 83% 99% 99%  Weight:      Height:        General: Pt is alert, awake, not in acute distress Cardiovascular: RRR, S1/S2 +, no rubs, no gallops Respiratory: CTA bilaterally, no wheezing, no rhonchi Abdominal: Soft, NT, ND, bowel sounds + Extremities: no edema, no cyanosis    The results of significant diagnostics from this hospitalization (including imaging, microbiology, ancillary and laboratory) are listed below for reference.     Microbiology: No results found for this or any previous visit (from the past 240 hour(s)).   Labs: BNP (last 3 results) No results for input(s): BNP in the last 8760 hours. Basic Metabolic Panel: Recent Labs  Lab 09/02/17 1716 09/02/17 2046 09/03/17 0554 09/04/17 0505 09/05/17 0530  NA 142  --  141 142 140  K 4.1  --  3.8 4.1 4.6  CL 105  --  108 110  103  CO2 29  --  GLUCOSE 104*  --  85 94 100*  BUN 20  --  CREATININE 0.84  --  0.77 0.75 0.96  CALCIUM 9.0  --  8.4* 8.4* 8.7*  MG  --  2.0  --   --   --    Liver Function Tests: Recent Labs  Lab 09/02/17 1716  AST 25  ALT 20  ALKPHOS 86  BILITOT 0.5  PROT 7.3  ALBUMIN 4.2   Recent Labs  Lab 09/02/17 2046  LIPASE 39   No results for input(s): AMMONIA in the last 168 hours. CBC: Recent Labs  Lab 09/02/17 1716 09/03/17 0554 09/04/17 0505 09/05/17 0530  WBC 7.7 6.2 6.1 7.0  NEUTROABS 4.9 3.1 3.5 3.8  HGB 12.3 11.6* 11.2* 11.9*  HCT 38.9 36.5 34.3* 36.6  MCV 89.6 89.7 90.7 89.5  PLT 265 227 191 243   Cardiac Enzymes: No results for input(s): CKTOTAL, CKMB, CKMBINDEX, TROPONINI in the last 168 hours. BNP: Invalid input(s): POCBNP CBG: No results for input(s): GLUCAP in the last 168  hours. D-Dimer No results for input(s): DDIMER in the last 72 hours. Hgb A1c No results for input(s): HGBA1C in the last 72 hours. Lipid Profile No results for input(s): CHOL, HDL, LDLCALC, TRIG, CHOLHDL, LDLDIRECT in the last 72 hours. Thyroid function studies No results for input(s): TSH, T4TOTAL, T3FREE, THYROIDAB in the last 72 hours.  Invalid input(s): FREET3 Anemia work up No results for input(s): VITAMINB12, FOLATE, FERRITIN, TIBC, IRON, RETICCTPCT in the last 72 hours. Urinalysis    Component Value Date/Time   COLORURINE STRAW (A) 09/02/2017 1657   APPEARANCEUR CLEAR 09/02/2017 1657   LABSPEC 1.015 09/02/2017 1657   PHURINE 7.0 09/02/2017 1657   GLUCOSEU NEGATIVE 09/02/2017 1657   HGBUR NEGATIVE 09/02/2017 1657   BILIRUBINUR NEGATIVE 09/02/2017 1657   KETONESUR NEGATIVE 09/02/2017 1657   PROTEINUR NEGATIVE 09/02/2017 1657   UROBILINOGEN 0.2 01/26/2013 1115   NITRITE NEGATIVE 09/02/2017 1657   LEUKOCYTESUR NEGATIVE 09/02/2017 1657   Sepsis Labs Invalid input(s): PROCALCITONIN,  WBC,  LACTICIDVEN Microbiology No results found for this or any previous visit (from the past 240 hour(s)).   Time coordinating discharge:  SIGNED:   Erick Blinks, MD  [facility] 09/05/2017, 7:28 PM Pager   If 7PM-7AM, please contact night-coverage [email or web address] Password [email or web address]

## 2017-09-11 ENCOUNTER — Ambulatory Visit (HOSPITAL_COMMUNITY): Payer: BLUE CROSS/BLUE SHIELD | Admitting: Psychiatry
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
## 2017-09-12 DIAGNOSIS — F314 Bipolar disorder, current episode depressed, severe, without psychotic features: Secondary | ICD-10-CM | POA: Diagnosis not present

## 2017-09-15 ENCOUNTER — Ambulatory Visit: Payer: BLUE CROSS/BLUE SHIELD | Admitting: Obstetrics and Gynecology

## 2017-09-16 ENCOUNTER — Telehealth: Payer: Self-pay | Admitting: Obstetrics and Gynecology

## 2017-09-17 NOTE — Telephone Encounter (Signed)
Entered in error

## 2017-10-01 ENCOUNTER — Encounter: Payer: Self-pay | Admitting: Physician Assistant

## 2017-10-01 ENCOUNTER — Ambulatory Visit (INDEPENDENT_AMBULATORY_CARE_PROVIDER_SITE_OTHER)
Admission: RE | Admit: 2017-10-01 | Discharge: 2017-10-01 | Disposition: A | Discharge status: Home / Self Care | Payer: BLUE CROSS/BLUE SHIELD | Source: Ambulatory Visit | Attending: Physician Assistant | Admitting: Physician Assistant

## 2017-10-01 ENCOUNTER — Ambulatory Visit (INDEPENDENT_AMBULATORY_CARE_PROVIDER_SITE_OTHER): Payer: BLUE CROSS/BLUE SHIELD | Admitting: Physician Assistant

## 2017-10-01 VITALS — BP 118/74 | HR 68 | Ht 64.0 in | Wt 128.0 lb

## 2017-10-01 DIAGNOSIS — Z8719 Personal history of other diseases of the digestive system: Secondary | ICD-10-CM | POA: Diagnosis not present

## 2017-10-01 DIAGNOSIS — R194 Change in bowel habit: Secondary | ICD-10-CM

## 2017-10-01 DIAGNOSIS — R140 Abdominal distension (gaseous): Secondary | ICD-10-CM | POA: Diagnosis not present

## 2017-10-01 DIAGNOSIS — K56609 Unspecified intestinal obstruction, unspecified as to partial versus complete obstruction: Secondary | ICD-10-CM | POA: Diagnosis not present

## 2017-10-01 DIAGNOSIS — Z1211 Encounter for screening for malignant neoplasm of colon: Secondary | ICD-10-CM | POA: Diagnosis not present

## 2017-10-01 DIAGNOSIS — Z1212 Encounter for screening for malignant neoplasm of rectum: Secondary | ICD-10-CM

## 2017-10-01 MED ORDER — LINACLOTIDE 290 MCG PO CAPS: ORAL_CAPSULE | ORAL | 5 refills | Status: DC

## 2017-10-01 NOTE — Progress Notes (Addendum)
Chief Complaint:Follow-up hospitalization for small bowel obstruction  HPI:    Christine Douglas is a 57 year old female with a past medical history as listed below, who was referred to me by Renaye Rakers, MD for follow-up after recently being seen in the hospital for small bowel obstruction .      09/02/17-09/05/17 hospitalization for small bowel obstruction. Presented to the ER with several weeks of worsening abdominal pain after eating with nausea. CT revealed a partial small bowel obstruction with a transition point. Gen. Surgery was consulted during admission. Patient had recent bilateral laparoscopic oophorectomy in February 2019. Described chronic issues with abdominal bloating and lower abdominal pain. Improved with an enema and MiraLAX with no surgical intervention necessary. She was started on  Linzess prior to discharge.    Today, explains that she does not feel like they did anything for her while she was in the hospital. She tells me she laid around for 4 days on Morphine and was given an enema and MiraLAX which never worked and she never had a bowel movement during her admission. She asked to be discharged and upon returning home tells me that she did have a large liquid bowel movement. Since then patient has continued with almost daily stools as long as she uses her Linzess 290 mcg and a dose of MiraLAX once a day. The stools are watery and sometimes she has to "strain for them". Describes still feeling very bloated and tells me that if she eats anything it makes her feel miserable immediately afterwards with an increased tightness and distention in her abdomen. She has gained around 15 pounds over the past 3 months. Does describe some of these symptoms prior to her hysterectomy in February, but "everything got worse afterwards".    Cares for her elderly disabled mother.    Denies fever, chills, nausea, vomiting, blood in her stool, melena or symptoms that awaken her at night.  Past Medical  History:  Diagnosis Date  . ADHD (attention deficit hyperactivity disorder)   . Anxiety   . BV (bacterial vaginosis)   . Depression   . Headache   . 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 Restless leg syndrome   . TO (tubal occlusion)     Past Surgical History:  Procedure Laterality Date  . ABDOMINAL HYSTERECTOMY    . BACK SURGERY     spine surgery; 2 screws and plate in neck  . CESAREAN SECTION    . LAPAROSCOPIC BILATERAL SALPINGO OOPHERECTOMY Bilateral 06/10/2017   Procedure: LAPAROSCOPIC BILATERAL OOPHORECTOMY;  Surgeon: Tilda Burrow, MD;  Location: [facility];  Service: Gynecology;  Laterality: Bilateral;  . SHOULDER ARTHROSCOPY WITH ROTATOR CUFF REPAIR AND SUBACROMIAL DECOMPRESSION  06/04/2012   Procedure: SHOULDER ARTHROSCOPY WITH ROTATOR CUFF REPAIR AND SUBACROMIAL DECOMPRESSION;  Surgeon: Loreta Ave, MD;  Location: [facility];  Service: Orthopedics;  Laterality: Left;  LEFT ARTHROSCOPY SHOULDER DECOMPRESSION SUBACROMIAL PARTIAL ACROMIOPLASTY WITH CORACOACROMIAL RELEASE, DISTAL CLAVICULECTOMY,  ROTATOR CUFF REPAIR     Current Outpatient Medications  Medication Sig Dispense Refill  . diazepam (VALIUM) 10 MG tablet Take 1 tablet (10 mg total) by mouth every 12 (twelve) hours as needed for anxiety. 60 tablet 2  . guaiFENesin (MUCINEX) 600 MG 12 hr tablet Take 1 tablet (600 mg total) by mouth 2 (two) times daily. 30 tablet 0  . lamoTRIgine (LAMICTAL) 25 MG tablet Take 25 mg by mouth 3 (three) times daily.    Marland Kitchen linaclotide Karlene Einstein)  290 MCG CAPS capsule Take 1 capsule (290 mcg total) by mouth daily before breakfast. 30 capsule 30  . loratadine (CLARITIN) 10 MG tablet Take 1 tablet (10 mg total) by mouth daily. 30 tablet 0  . polyethylene glycol (MIRALAX) packet Take 17 g by mouth daily. 14 each 0  . tolterodine (DETROL LA) 2 MG 24 hr capsule Take 1 capsule (2 mg total) by mouth daily. 30 capsule 0   No current facility-administered medications  for this visit.     Allergies as of 10/01/2017 - Review Complete 09/04/2017  Allergen Reaction Noted  . Anesthetics, amide Anaphylaxis 11/06/2015  . Flexeril [cyclobenzaprine] Swelling 06/01/2012    Family History  Problem Relation Age of Onset  . Hyperlipidemia Mother   . Thyroid disease Mother   . Other Brother        suicide  . Bipolar disorder Son   . Cancer Other   . Dementia Maternal Aunt     Social History   Socioeconomic History  . Marital status: Divorced    Spouse name: Not on file  . Number of children: Not on file  . Years of education: 30  . Highest education level: Not on file  Occupational History  . Not on file  Social Needs  . Financial resource strain: Not on file  . Food insecurity:    Worry: Not on file    Inability: Not on file  . Transportation needs:    Medical: Not on file    Non-medical: Not on file  Tobacco Use  . Smoking status: Former Smoker    Types: Cigarettes  . Smokeless tobacco: Never Used  Substance and Sexual Activity  . Alcohol use: No    Alcohol/week: 0.0 oz    Frequency: Never    Comment: wine occassionally  . Drug use: No  . Sexual activity: Not Currently    Birth control/protection: Surgical    Comment: hyst  Lifestyle  . Physical activity:    Days per week: Not on file    Minutes per session: Not on file  . Stress: Not on file  Relationships  . Social connections:    Talks on phone: Not on file    Gets together: Not on file    Attends religious service: Not on file    Active member of club or organization: Not on file    Attends meetings of clubs or organizations: Not on file    Relationship status: Not on file  . Intimate partner violence:    Fear of current or ex partner: Not on file    Emotionally abused: Not on file    Physically abused: Not on file    Forced sexual activity: Not on file  Other Topics Concern  . Not on file  Social History Narrative   Drinks 1 cup of coffee a day     Review of  Systems:    Constitutional: No weight loss, fever or chills Skin: No rash  Cardiovascular: No chest pain  Respiratory: No SOB Gastrointestinal: See HPI and otherwise negative Genitourinary: No dysuria  Neurological: No headache, dizziness or syncope Musculoskeletal: No new muscle or joint pain Hematologic: No bleeding  Psychiatric: No history of depression or anxiety   Physical Exam:  Vital signs: BP 118/74   Pulse 68   Ht  (1.626 m)   Wt 128 lb (58.1 kg)   BMI 21.97 kg/m    Constitutional:   Pleasant Caucasian female appears to be in NAD, Well  developed, Well nourished, alert and cooperative Head:  Normocephalic and atraumatic. Eyes:   PEERL, EOMI. No icterus. Conjunctiva pink. Ears:  Normal auditory acuity. Neck:  Supple Throat: Oral cavity and pharynx without inflammation, swelling or lesion.  Respiratory: Respirations even and unlabored. Lungs clear to auscultation bilaterally.   No wheezes, crackles, or rhonchi.  Cardiovascular: Normal S1, S2. No MRG. Regular rate and rhythm. No peripheral edema, cyanosis or pallor.  Gastrointestinal:  Soft, mild distension, nontender. No rebound or guarding. Normal bowel sounds. No appreciable masses or hepatomegaly. Rectal:  Not performed.  Msk:  Symmetrical without gross deformities. Without edema, no deformity or joint abnormality.  Neurologic:  Alert and  oriented x4;  grossly normal neurologically.  Skin:   Dry and intact without significant lesions or rashes. Psychiatric: Demonstrates good judgement and reason without abnormal affect or behaviors.  RELEVANT LABS AND IMAGING: CBC    Component Value Date/Time   WBC 7.0 09/05/2017 0530   RBC 4.09 09/05/2017 0530   HGB 11.9 (L) 09/05/2017 0530   HCT 36.6 09/05/2017 0530   PLT 243 09/05/2017 0530   MCV 89.5 09/05/2017 0530   MCH 29.1 09/05/2017 0530   MCHC 32.5 09/05/2017 0530   RDW 12.6 09/05/2017 0530   LYMPHSABS 2.3 09/05/2017 0530   MONOABS 0.6 09/05/2017 0530    EOSABS 0.2 09/05/2017 0530   BASOSABS 0.1 09/05/2017 0530    CMP     Component Value Date/Time   NA 140 09/05/2017 0530   K 4.6 09/05/2017 0530   CL 103 09/05/2017 0530   CO2 27 09/05/2017 0530   GLUCOSE 100 (H) 09/05/2017 0530   BUN 14 09/05/2017 0530   CREATININE 0.96 09/05/2017 0530   CALCIUM 8.7 (L) 09/05/2017 0530   PROT 7.3 09/02/2017 1716   ALBUMIN 4.2 09/02/2017 1716   AST 25 09/02/2017 1716   ALT 20 09/02/2017 1716   ALKPHOS 86 09/02/2017 1716   BILITOT 0.5 09/02/2017 1716   GFRNONAA >60 09/05/2017 0530   GFRAA >60 09/05/2017 0530   EXAM: CT ABDOMEN AND PELVIS WITH CONTRAST 09/02/17  TECHNIQUE: Multidetector CT imaging of the abdomen and pelvis was performed using the standard protocol following bolus administration of intravenous contrast.  CONTRAST:  ISOVUE-300 IOPAMIDOL (ISOVUE-300) INJECTION 61%  COMPARISON:  None.  FINDINGS: Lower chest: Mildly prominent pulmonary vasculature. No pleural fluid.  Hepatobiliary: No focal liver abnormality is seen. No gallstones, gallbladder wall thickening, or biliary dilatation.  Pancreas: Unremarkable. No pancreatic ductal dilatation or surrounding inflammatory changes.  Spleen: Normal in size without focal abnormality.  Adrenals/Urinary Tract: Adrenal glands are unremarkable. Kidneys are normal, without renal calculi, focal lesion, or hydronephrosis. Bladder is unremarkable.  Stomach/Bowel: Multiple loops of dilated jejunum in the left mid abdomen with a transition to normal caliber small bowel in the left mid abdomen. The stomach and small bowel proximal to these loops are normal in caliber. Prominent stool and gas in normal caliber colon. No evidence of appendicitis.  Vascular/Lymphatic: No significant vascular findings are present. No enlarged abdominal or pelvic lymph nodes.  Reproductive: Status post hysterectomy. No adnexal masses.  Other: No abdominal wall hernia  seen.  Musculoskeletal: Unremarkable bones.  IMPRESSION: Partial small bowel obstruction with a transition in the left mid abdomen. This has an appearance suggesting a closed loop obstruction, possibly due to an internal hernia. Adhesions are also possible.   Electronically Signed   By: Beckie Salts M.D.   On: 09/02/2017 21:29  Assessment: 1. History of partial small bowel obstruction:  Hospitalized for 4 days, see history of present illness, question of internal hernia on CT 2. Screening for colorectal cancer: patient is 65 and never had a colonoscopy 3. Change in bowel habits: Towards constipation ever since hysterectomy in February, recent CT showing partial small bowel obstruction; question possible internal hernia versus IBS 4. Bloating: Likely related to all of the above  Plan: 1. Ordered KUB 2 view today 2. Recommend patient continue her Linzess 290 mcg daily as well as daily MiraLAX in order to continue daily bowel movements 3. Scheduled patient for a screening colonoscopy in [facility] with Dr. Leone Payor. Did discuss risks, benefits, limitations and alternatives the patient agrees to proceed. 4. Briefly discussed IBS. If patient's colonoscopy is normal and her x-ray is normal, she would like to follow with dietitian regarding the low FODMAP diet in the future 5. Patient to follow in clinic per recommendations from Dr. Leone Payor after time of procedure.  Hyacinth Meeker, PA-C [facility] 10/01/2017, 2:36 PM  Cc: Renaye Rakers, MD   Agree with Ms. Vicent Febles's evaluation and management.  Iva Boop, MD, [facility]

## 2017-10-01 NOTE — Patient Instructions (Signed)
If you are age 55 or younger, your body mass index should be between 19-25. Your Body mass index is 21.97 kg/m. If this is out of the aformentioned range listed, please consider follow up with your Primary Care Provider.   Your provider has requested that you go to the basement level for an X-ray, in the radiology department,  Press "B" on the elevator. Go to the Radiology department past the vending machine.   We sent refills to the pharmacy for Linzess 290 mcg.   You have been scheduled for a colonoscopy. Please follow written instructions given to you at your visit today.  Please pick up your prep supplies at the pharmacy within the next 1-3 days. If you use inhalers (even only as needed), please bring them with you on the day of your procedure. Your physician has requested that you go to www.startemmi.com and enter the access code given to you at your visit today. This web site gives a general overview about your procedure. However, you should still follow specific instructions given to you by our office regarding your preparation for the procedure.

## 2017-10-02 ENCOUNTER — Ambulatory Visit (INDEPENDENT_AMBULATORY_CARE_PROVIDER_SITE_OTHER): Payer: BLUE CROSS/BLUE SHIELD | Admitting: Psychiatry

## 2017-10-02 ENCOUNTER — Telehealth: Payer: Self-pay | Admitting: Internal Medicine

## 2017-10-02 ENCOUNTER — Encounter (HOSPITAL_COMMUNITY): Payer: Self-pay | Admitting: Psychiatry

## 2017-10-02 VITALS — BP 107/73 | HR 94 | Ht 64.0 in | Wt 127.2 lb

## 2017-10-02 DIAGNOSIS — F330 Major depressive disorder, recurrent, mild: Secondary | ICD-10-CM

## 2017-10-02 DIAGNOSIS — F4310 Post-traumatic stress disorder, unspecified: Secondary | ICD-10-CM

## 2017-10-02 DIAGNOSIS — Z87891 Personal history of nicotine dependence: Secondary | ICD-10-CM | POA: Diagnosis not present

## 2017-10-02 DIAGNOSIS — Z62810 Personal history of physical and sexual abuse in childhood: Secondary | ICD-10-CM | POA: Diagnosis not present

## 2017-10-02 DIAGNOSIS — Z818 Family history of other mental and behavioral disorders: Secondary | ICD-10-CM | POA: Diagnosis not present

## 2017-10-02 DIAGNOSIS — F900 Attention-deficit hyperactivity disorder, predominantly inattentive type: Secondary | ICD-10-CM

## 2017-10-02 MED ORDER — HYDROXYZINE HCL 25 MG PO TABS: 25.0000 mg | ORAL_TABLET | Freq: Three times a day (TID) | ORAL | 2 refills | Status: DC | PRN

## 2017-10-02 MED ORDER — DIAZEPAM 10 MG PO TABS: 10.0000 mg | ORAL_TABLET | Freq: Two times a day (BID) | ORAL | 2 refills | Status: DC | PRN

## 2017-10-02 MED ORDER — PRAZOSIN HCL 2 MG PO CAPS: 2.0000 mg | ORAL_CAPSULE | Freq: Every day | ORAL | 2 refills | Status: DC

## 2017-10-02 MED ORDER — BUPROPION HCL ER (XL) 150 MG PO TB24: 150.0000 mg | ORAL_TABLET | ORAL | 1 refills | Status: DC

## 2017-10-02 NOTE — Telephone Encounter (Signed)
Hi Dr. Leone Payor, this pt just cancelled colon scheduled for tomorrow 10/03/17 at 8:00am. The person who agreed to bring her has a conflict and cannot longer be her care partner. She r/s to 11/30/17. Thank you.
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
## 2017-10-02 NOTE — Telephone Encounter (Signed)
Patient notified

## 2017-10-03 ENCOUNTER — Encounter: Payer: Self-pay | Admitting: Internal Medicine

## 2017-10-03 NOTE — Telephone Encounter (Signed)
Dr. Leone PayorGessner, I spoke with her and she would like to have procedure in June, she just needs to coordinate with her care partner in advance. Please let me know when you have potential dates that I can offer her. Thank you.

## 2017-10-03 NOTE — Telephone Encounter (Signed)
Understand  No charge  Ask her if she wants to do it sooner - I do add people on for 0730 after my schedule is full and if she wanst to try for June we cam look

## 2017-10-07 NOTE — Telephone Encounter (Signed)
It looks like there is an opening on June 20 she can have.  If this will not work [place] and we can use a 730 slot on any day of the week except a Thursday.

## 2017-10-10 ENCOUNTER — Telehealth: Payer: Self-pay | Admitting: Internal Medicine

## 2017-10-10 NOTE — Telephone Encounter (Signed)
She is wanting a prescription for a "probiotic" which is working well for her.  Explained that she could get that OTC and that we did not send prescriptions for OTC products.  She was agreeable to continuing as an OTC.

## 2017-10-10 NOTE — Telephone Encounter (Signed)
Pt wants speak with a nurse regarding probiotics that she is taking. Pls call her

## 2017-10-13 NOTE — Telephone Encounter (Signed)
Santina EvansCatherine, Please offer her any 7:30 appt when [facility] is [facility] that there is not already a patient there.  I will then open the spot.

## 2017-10-13 NOTE — Telephone Encounter (Signed)
Hi Sheri, could you please help me with this? Please see Dr. Marvell FullerGessner's notes. Thank you.

## 2017-10-15 NOTE — Telephone Encounter (Signed)
Called pt and she decided to keep original proc date on 11/20/17.

## 2017-10-24 ENCOUNTER — Other Ambulatory Visit (HOSPITAL_COMMUNITY): Payer: Self-pay | Admitting: Psychiatry

## 2017-10-24 DIAGNOSIS — F900 Attention-deficit hyperactivity disorder, predominantly inattentive type: Secondary | ICD-10-CM

## 2017-10-24 DIAGNOSIS — F330 Major depressive disorder, recurrent, mild: Secondary | ICD-10-CM

## 2017-11-20 ENCOUNTER — Encounter: Payer: Self-pay | Admitting: Internal Medicine

## 2017-12-09 ENCOUNTER — Telehealth: Payer: Self-pay | Admitting: Physician Assistant

## 2017-12-09 MED ORDER — LINACLOTIDE 290 MCG PO CAPS: ORAL_CAPSULE | ORAL | 5 refills | Status: DC

## 2017-12-09 NOTE — Telephone Encounter (Signed)
Pt informed samples put up front.

## 2017-12-09 NOTE — Telephone Encounter (Signed)
Rx sent to pharmacy   

## 2017-12-11 ENCOUNTER — Encounter (HOSPITAL_COMMUNITY): Payer: Self-pay | Admitting: Psychiatry

## 2017-12-11 ENCOUNTER — Ambulatory Visit (INDEPENDENT_AMBULATORY_CARE_PROVIDER_SITE_OTHER): Payer: BLUE CROSS/BLUE SHIELD | Admitting: Psychiatry

## 2017-12-11 VITALS — BP 124/72 | HR 80 | Ht 64.0 in | Wt 139.0 lb

## 2017-12-11 DIAGNOSIS — F4310 Post-traumatic stress disorder, unspecified: Secondary | ICD-10-CM

## 2017-12-11 DIAGNOSIS — Z87891 Personal history of nicotine dependence: Secondary | ICD-10-CM

## 2017-12-11 DIAGNOSIS — F99 Mental disorder, not otherwise specified: Secondary | ICD-10-CM

## 2017-12-11 DIAGNOSIS — F900 Attention-deficit hyperactivity disorder, predominantly inattentive type: Secondary | ICD-10-CM

## 2017-12-11 DIAGNOSIS — F330 Major depressive disorder, recurrent, mild: Secondary | ICD-10-CM

## 2017-12-11 DIAGNOSIS — F5105 Insomnia due to other mental disorder: Secondary | ICD-10-CM | POA: Diagnosis not present

## 2017-12-11 MED ORDER — BUPROPION HCL ER (XL) 300 MG PO TB24: 300.0000 mg | ORAL_TABLET | Freq: Every day | ORAL | 2 refills | Status: DC

## 2017-12-11 MED ORDER — PRAZOSIN HCL 2 MG PO CAPS: 2.0000 mg | ORAL_CAPSULE | Freq: Every day | ORAL | 2 refills | Status: DC

## 2017-12-11 MED ORDER — DIAZEPAM 10 MG PO TABS: 10.0000 mg | ORAL_TABLET | Freq: Two times a day (BID) | ORAL | 2 refills | Status: DC | PRN

## 2017-12-11 MED ORDER — DOXEPIN HCL 10 MG PO CAPS: 10.0000 mg | ORAL_CAPSULE | Freq: Every day | ORAL | 1 refills | Status: DC
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
## 2018-01-02 ENCOUNTER — Other Ambulatory Visit (HOSPITAL_COMMUNITY): Payer: Self-pay | Admitting: Psychiatry

## 2018-01-02 DIAGNOSIS — F900 Attention-deficit hyperactivity disorder, predominantly inattentive type: Secondary | ICD-10-CM

## 2018-01-02 DIAGNOSIS — F330 Major depressive disorder, recurrent, mild: Secondary | ICD-10-CM

## 2018-01-20 ENCOUNTER — Other Ambulatory Visit (HOSPITAL_COMMUNITY): Payer: Self-pay | Admitting: Psychiatry

## 2018-01-20 DIAGNOSIS — F330 Major depressive disorder, recurrent, mild: Secondary | ICD-10-CM

## 2018-01-20 DIAGNOSIS — F900 Attention-deficit hyperactivity disorder, predominantly inattentive type: Secondary | ICD-10-CM

## 2018-01-30 DIAGNOSIS — Z6823 Body mass index (BMI) 23.0-23.9, adult: Secondary | ICD-10-CM | POA: Diagnosis not present

## 2018-01-30 DIAGNOSIS — G43009 Migraine without aura, not intractable, without status migrainosus: Secondary | ICD-10-CM | POA: Diagnosis not present

## 2018-01-30 DIAGNOSIS — N898 Other specified noninflammatory disorders of vagina: Secondary | ICD-10-CM | POA: Diagnosis not present

## 2018-01-30 DIAGNOSIS — R300 Dysuria: Secondary | ICD-10-CM | POA: Diagnosis not present

## 2018-02-03 ENCOUNTER — Ambulatory Visit: Payer: BLUE CROSS/BLUE SHIELD | Admitting: Women's Health

## 2018-02-05 DIAGNOSIS — G43709 Chronic migraine without aura, not intractable, without status migrainosus: Secondary | ICD-10-CM | POA: Diagnosis not present

## 2018-02-05 DIAGNOSIS — G43009 Migraine without aura, not intractable, without status migrainosus: Secondary | ICD-10-CM | POA: Diagnosis not present

## 2018-02-09 DIAGNOSIS — Z029 Encounter for administrative examinations, unspecified: Secondary | ICD-10-CM

## 2018-02-12 ENCOUNTER — Ambulatory Visit (INDEPENDENT_AMBULATORY_CARE_PROVIDER_SITE_OTHER): Payer: BLUE CROSS/BLUE SHIELD | Admitting: Psychiatry

## 2018-02-12 ENCOUNTER — Encounter (HOSPITAL_COMMUNITY): Payer: Self-pay | Admitting: Psychiatry

## 2018-02-12 VITALS — BP 118/78 | HR 95 | Ht 63.25 in | Wt 135.0 lb

## 2018-02-12 DIAGNOSIS — F330 Major depressive disorder, recurrent, mild: Secondary | ICD-10-CM | POA: Diagnosis not present

## 2018-02-12 DIAGNOSIS — F4310 Post-traumatic stress disorder, unspecified: Secondary | ICD-10-CM | POA: Diagnosis not present

## 2018-02-12 DIAGNOSIS — F900 Attention-deficit hyperactivity disorder, predominantly inattentive type: Secondary | ICD-10-CM | POA: Diagnosis not present

## 2018-02-12 DIAGNOSIS — F99 Mental disorder, not otherwise specified: Secondary | ICD-10-CM

## 2018-02-12 DIAGNOSIS — F5105 Insomnia due to other mental disorder: Secondary | ICD-10-CM

## 2018-02-12 MED ORDER — PRAZOSIN HCL 2 MG PO CAPS: 2.0000 mg | ORAL_CAPSULE | Freq: Every day | ORAL | 2 refills | Status: DC

## 2018-02-12 MED ORDER — BUPROPION HCL ER (XL) 150 MG PO TB24: 450.0000 mg | ORAL_TABLET | Freq: Every day | ORAL | 2 refills | Status: DC

## 2018-02-12 MED ORDER — DIAZEPAM 10 MG PO TABS: 10.0000 mg | ORAL_TABLET | Freq: Two times a day (BID) | ORAL | 2 refills | Status: DC | PRN

## 2018-02-12 MED ORDER — HYDROXYZINE HCL 25 MG PO TABS: 25.0000 mg | ORAL_TABLET | Freq: Three times a day (TID) | ORAL | 2 refills | Status: DC | PRN
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
## 2018-02-24 ENCOUNTER — Other Ambulatory Visit (HOSPITAL_COMMUNITY): Payer: Self-pay | Admitting: Psychiatry

## 2018-02-24 DIAGNOSIS — F99 Mental disorder, not otherwise specified: Principal | ICD-10-CM

## 2018-02-24 DIAGNOSIS — F5105 Insomnia due to other mental disorder: Secondary | ICD-10-CM

## 2018-02-27 DIAGNOSIS — G43709 Chronic migraine without aura, not intractable, without status migrainosus: Secondary | ICD-10-CM | POA: Diagnosis not present

## 2018-03-04 DIAGNOSIS — G43709 Chronic migraine without aura, not intractable, without status migrainosus: Secondary | ICD-10-CM | POA: Diagnosis not present

## 2018-03-10 ENCOUNTER — Emergency Department (HOSPITAL_COMMUNITY): Payer: BLUE CROSS/BLUE SHIELD

## 2018-03-10 ENCOUNTER — Emergency Department (HOSPITAL_COMMUNITY)
Admission: EM | Admit: 2018-03-10 | Discharge: 2018-03-10 | Disposition: A | Discharge status: Home / Self Care | Payer: BLUE CROSS/BLUE SHIELD | Attending: Emergency Medicine | Admitting: Emergency Medicine

## 2018-03-10 ENCOUNTER — Encounter (HOSPITAL_COMMUNITY): Payer: Self-pay | Admitting: Emergency Medicine

## 2018-03-10 ENCOUNTER — Other Ambulatory Visit: Payer: Self-pay

## 2018-03-10 DIAGNOSIS — Z87891 Personal history of nicotine dependence: Secondary | ICD-10-CM | POA: Insufficient documentation

## 2018-03-10 DIAGNOSIS — S299XXA Unspecified injury of thorax, initial encounter: Secondary | ICD-10-CM | POA: Diagnosis not present

## 2018-03-10 DIAGNOSIS — R0781 Pleurodynia: Secondary | ICD-10-CM

## 2018-03-10 DIAGNOSIS — M545 Low back pain: Secondary | ICD-10-CM | POA: Diagnosis not present

## 2018-03-10 DIAGNOSIS — S199XXA Unspecified injury of neck, initial encounter: Secondary | ICD-10-CM | POA: Diagnosis not present

## 2018-03-10 DIAGNOSIS — R0789 Other chest pain: Secondary | ICD-10-CM | POA: Insufficient documentation

## 2018-03-10 DIAGNOSIS — Y92410 Unspecified street and highway as the place of occurrence of the external cause: Secondary | ICD-10-CM | POA: Diagnosis not present

## 2018-03-10 DIAGNOSIS — M542 Cervicalgia: Secondary | ICD-10-CM | POA: Insufficient documentation

## 2018-03-10 DIAGNOSIS — M25511 Pain in right shoulder: Secondary | ICD-10-CM | POA: Diagnosis not present

## 2018-03-10 DIAGNOSIS — S0990XA Unspecified injury of head, initial encounter: Secondary | ICD-10-CM | POA: Diagnosis not present

## 2018-03-10 DIAGNOSIS — Y998 Other external cause status: Secondary | ICD-10-CM | POA: Diagnosis not present

## 2018-03-10 DIAGNOSIS — Y939 Activity, unspecified: Secondary | ICD-10-CM | POA: Diagnosis not present

## 2018-03-10 DIAGNOSIS — H538 Other visual disturbances: Secondary | ICD-10-CM | POA: Insufficient documentation

## 2018-03-10 DIAGNOSIS — Z79899 Other long term (current) drug therapy: Secondary | ICD-10-CM | POA: Diagnosis not present

## 2018-03-10 DIAGNOSIS — S3992XA Unspecified injury of lower back, initial encounter: Secondary | ICD-10-CM | POA: Diagnosis not present

## 2018-03-10 MED ORDER — NAPROXEN 250 MG PO TABS
500.0000 mg | ORAL_TABLET | Freq: Once | ORAL | Status: AC
  Administered 2018-03-10: 500 mg via ORAL
  Filled 2018-03-10 (×2): qty 2
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
## 2018-03-10 NOTE — Discharge Instructions (Addendum)
All your imaging today was normal.You may alternate ibuprofen or Tylenol for your pain as needed.  You can also continue to apply heat to your shoulder region as this will be sore for the next couple days.  Follow-up with your primary care physician as needed.
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
## 2018-04-16 DIAGNOSIS — G43009 Migraine without aura, not intractable, without status migrainosus: Secondary | ICD-10-CM | POA: Diagnosis not present

## 2018-04-16 DIAGNOSIS — H8309 Labyrinthitis, unspecified ear: Secondary | ICD-10-CM | POA: Diagnosis not present

## 2018-04-16 DIAGNOSIS — R110 Nausea: Secondary | ICD-10-CM | POA: Diagnosis not present

## 2018-04-16 DIAGNOSIS — R6883 Chills (without fever): Secondary | ICD-10-CM | POA: Diagnosis not present

## 2018-04-23 ENCOUNTER — Ambulatory Visit (INDEPENDENT_AMBULATORY_CARE_PROVIDER_SITE_OTHER): Payer: BLUE CROSS/BLUE SHIELD | Admitting: Psychiatry

## 2018-04-23 ENCOUNTER — Encounter (HOSPITAL_COMMUNITY): Payer: Self-pay | Admitting: Psychiatry

## 2018-04-23 DIAGNOSIS — F900 Attention-deficit hyperactivity disorder, predominantly inattentive type: Secondary | ICD-10-CM

## 2018-04-23 DIAGNOSIS — F4310 Post-traumatic stress disorder, unspecified: Secondary | ICD-10-CM

## 2018-04-23 DIAGNOSIS — F99 Mental disorder, not otherwise specified: Secondary | ICD-10-CM

## 2018-04-23 DIAGNOSIS — F5105 Insomnia due to other mental disorder: Secondary | ICD-10-CM

## 2018-04-23 DIAGNOSIS — F330 Major depressive disorder, recurrent, mild: Secondary | ICD-10-CM | POA: Diagnosis not present

## 2018-04-23 MED ORDER — PRAZOSIN HCL 2 MG PO CAPS: 2.0000 mg | ORAL_CAPSULE | Freq: Every day | ORAL | 2 refills | Status: DC

## 2018-04-23 MED ORDER — HYDROXYZINE HCL 25 MG PO TABS: 25.0000 mg | ORAL_TABLET | Freq: Three times a day (TID) | ORAL | 2 refills | Status: DC | PRN

## 2018-04-23 MED ORDER — BUPROPION HCL ER (XL) 150 MG PO TB24: 450.0000 mg | ORAL_TABLET | Freq: Every day | ORAL | 2 refills | Status: DC

## 2018-04-23 MED ORDER — DIAZEPAM 10 MG PO TABS: 10.0000 mg | ORAL_TABLET | Freq: Two times a day (BID) | ORAL | 2 refills | Status: DC | PRN
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
## 2018-06-25 ENCOUNTER — Ambulatory Visit (HOSPITAL_COMMUNITY): Payer: BLUE CROSS/BLUE SHIELD | Admitting: Psychiatry

## 2018-08-27 ENCOUNTER — Encounter (HOSPITAL_COMMUNITY): Payer: Self-pay | Admitting: Psychiatry

## 2018-08-27 ENCOUNTER — Other Ambulatory Visit: Payer: Self-pay

## 2018-08-27 ENCOUNTER — Ambulatory Visit (INDEPENDENT_AMBULATORY_CARE_PROVIDER_SITE_OTHER): Payer: BLUE CROSS/BLUE SHIELD | Admitting: Psychiatry

## 2018-08-27 DIAGNOSIS — F4310 Post-traumatic stress disorder, unspecified: Secondary | ICD-10-CM | POA: Diagnosis not present

## 2018-08-27 DIAGNOSIS — F900 Attention-deficit hyperactivity disorder, predominantly inattentive type: Secondary | ICD-10-CM | POA: Diagnosis not present

## 2018-08-27 DIAGNOSIS — F330 Major depressive disorder, recurrent, mild: Secondary | ICD-10-CM | POA: Diagnosis not present

## 2018-08-27 MED ORDER — DIAZEPAM 10 MG PO TABS: 10.0000 mg | ORAL_TABLET | Freq: Two times a day (BID) | ORAL | 2 refills | Status: DC | PRN

## 2018-08-27 MED ORDER — BUPROPION HCL ER (XL) 150 MG PO TB24: 450.0000 mg | ORAL_TABLET | Freq: Every day | ORAL | 2 refills | Status: DC

## 2018-08-27 MED ORDER — PRAZOSIN HCL 1 MG PO CAPS: 3.0000 mg | ORAL_CAPSULE | Freq: Every day | ORAL | 2 refills | Status: DC
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
## 2018-09-14 ENCOUNTER — Telehealth: Payer: Self-pay | Admitting: Obstetrics and Gynecology

## 2018-09-14 NOTE — Telephone Encounter (Signed)
Called patient and left message that she can call back to schedule appt.

## 2018-09-14 NOTE — Telephone Encounter (Signed)
Pt requesting an appt to be seen for a possible bacterial infection.

## 2018-09-15 NOTE — Telephone Encounter (Signed)
Called patient and left message to call back with symptoms to see if we can treat over phone or if she needs in person visit.

## 2018-09-20 ENCOUNTER — Other Ambulatory Visit (HOSPITAL_COMMUNITY): Payer: Self-pay | Admitting: Psychiatry

## 2018-09-20 DIAGNOSIS — F900 Attention-deficit hyperactivity disorder, predominantly inattentive type: Secondary | ICD-10-CM

## 2018-09-20 DIAGNOSIS — F330 Major depressive disorder, recurrent, mild: Secondary | ICD-10-CM

## 2018-09-29 DIAGNOSIS — K439 Ventral hernia without obstruction or gangrene: Secondary | ICD-10-CM | POA: Diagnosis not present

## 2018-10-05 ENCOUNTER — Other Ambulatory Visit (HOSPITAL_COMMUNITY): Payer: Self-pay | Admitting: Psychiatry

## 2018-10-05 DIAGNOSIS — F4310 Post-traumatic stress disorder, unspecified: Secondary | ICD-10-CM

## 2018-10-22 ENCOUNTER — Other Ambulatory Visit: Payer: Self-pay

## 2018-10-22 ENCOUNTER — Ambulatory Visit (INDEPENDENT_AMBULATORY_CARE_PROVIDER_SITE_OTHER): Payer: BLUE CROSS/BLUE SHIELD | Admitting: Psychiatry

## 2018-10-22 ENCOUNTER — Telehealth (HOSPITAL_COMMUNITY): Payer: Self-pay | Admitting: Psychiatry

## 2018-10-22 DIAGNOSIS — Z5329 Procedure and treatment not carried out because of patient's decision for other reasons: Secondary | ICD-10-CM

## 2018-10-22 NOTE — Telephone Encounter (Signed)
I called the home line and it was disconnected. I called her cell phone twice and there was no answer. I left VM asking pt to call the clinic

## 2018-10-22 NOTE — Progress Notes (Signed)
Pt did not answer the phone. I attempted 2x on both numbers. I left her a voice message asking to call the office to reschedule.

## 2018-11-19 ENCOUNTER — Encounter (HOSPITAL_COMMUNITY): Payer: Self-pay | Admitting: Psychiatry

## 2018-11-19 ENCOUNTER — Other Ambulatory Visit: Payer: Self-pay

## 2018-11-19 ENCOUNTER — Ambulatory Visit (INDEPENDENT_AMBULATORY_CARE_PROVIDER_SITE_OTHER): Payer: BC Managed Care – PPO | Admitting: Psychiatry

## 2018-11-19 DIAGNOSIS — F5105 Insomnia due to other mental disorder: Secondary | ICD-10-CM

## 2018-11-19 DIAGNOSIS — F99 Mental disorder, not otherwise specified: Secondary | ICD-10-CM

## 2018-11-19 DIAGNOSIS — F4310 Post-traumatic stress disorder, unspecified: Secondary | ICD-10-CM

## 2018-11-19 DIAGNOSIS — F900 Attention-deficit hyperactivity disorder, predominantly inattentive type: Secondary | ICD-10-CM

## 2018-11-19 DIAGNOSIS — F330 Major depressive disorder, recurrent, mild: Secondary | ICD-10-CM

## 2018-11-19 MED ORDER — DIAZEPAM 10 MG PO TABS: 10.0000 mg | ORAL_TABLET | Freq: Two times a day (BID) | ORAL | 2 refills | Status: DC | PRN

## 2018-11-19 MED ORDER — PRAZOSIN HCL 1 MG PO CAPS: 3.0000 mg | ORAL_CAPSULE | Freq: Every day | ORAL | 2 refills | Status: DC

## 2018-11-19 MED ORDER — BUPROPION HCL ER (XL) 150 MG PO TB24: 450.0000 mg | ORAL_TABLET | Freq: Every day | ORAL | 2 refills | Status: DC

## 2018-11-19 MED ORDER — HYDROXYZINE HCL 25 MG PO TABS: 25.0000 mg | ORAL_TABLET | Freq: Every day | ORAL | 2 refills | Status: DC
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
## 2018-12-04 DIAGNOSIS — M25512 Pain in left shoulder: Secondary | ICD-10-CM | POA: Diagnosis not present

## 2018-12-04 DIAGNOSIS — M542 Cervicalgia: Secondary | ICD-10-CM | POA: Diagnosis not present

## 2018-12-07 ENCOUNTER — Telehealth: Payer: Self-pay | Admitting: Obstetrics and Gynecology

## 2018-12-07 NOTE — Telephone Encounter (Signed)
Pt is requesting to start on estrogen patches. States she had a hysterectomy almost 2 years ago.

## 2018-12-11 ENCOUNTER — Other Ambulatory Visit (HOSPITAL_COMMUNITY): Payer: Self-pay | Admitting: Psychiatry

## 2018-12-11 DIAGNOSIS — F5105 Insomnia due to other mental disorder: Secondary | ICD-10-CM

## 2018-12-11 DIAGNOSIS — F4310 Post-traumatic stress disorder, unspecified: Secondary | ICD-10-CM

## 2018-12-11 DIAGNOSIS — M546 Pain in thoracic spine: Secondary | ICD-10-CM | POA: Diagnosis not present

## 2018-12-11 DIAGNOSIS — M25512 Pain in left shoulder: Secondary | ICD-10-CM | POA: Diagnosis not present

## 2018-12-19 DIAGNOSIS — M542 Cervicalgia: Secondary | ICD-10-CM | POA: Diagnosis not present

## 2018-12-19 DIAGNOSIS — M545 Low back pain: Secondary | ICD-10-CM | POA: Diagnosis not present

## 2019-01-14 DIAGNOSIS — M5412 Radiculopathy, cervical region: Secondary | ICD-10-CM | POA: Diagnosis not present

## 2019-01-19 ENCOUNTER — Other Ambulatory Visit: Payer: Self-pay | Admitting: Neurological Surgery

## 2019-01-27 NOTE — Pre-Procedure Instructions (Signed)
Christine Douglas  01/27/2019      [id number] - [place], [place] [address] AT [address] [place] [address] Phone: [phone] Fax: [phone]  [id number] - [place], [place] [address] Phone: [phone] Fax: [phone]    Your procedure is scheduled on 02/05/19.  Report to [facility] Admitting at 530 A.M.  Call this number if you have problems the morning of surgery:  [phone]   Remember:  Do not eat or drink after midnight.  Y   Take these medicines the morning of surgery with A SIP OF WATER ---valium,hydrocodone    Do not wear jewelry, make-up or nail polish.  Do not wear lotions, powders, or perfumes, or deodorant.  Do not shave 48 hours prior to surgery.  Men may shave face and neck.  Do not bring valuables to the hospital.  [facility] is not responsible for any belongings or valuables.  Contacts, dentures or bridgework may not be worn into surgery.  Leave your suitcase in the car.  After surgery it may be brought to your room.  For patients admitted to the hospital, discharge time will be determined by your treatment team.  Patients discharged the day of surgery will not be allowed to drive home.    Special instructions:  Do not take any aspirin,anti-inflammatories,vitamins,or herbal supplements 5-7 days prior to surgery.[facility] - Preparing for Surgery  Before surgery, you can play an important role.  Because skin is not sterile, your skin needs to be as free of germs as possible.  You can reduce the number of germs on you skin by washing with CHG (chlorahexidine gluconate) soap before surgery.  CHG is an antiseptic cleaner which kills germs and bonds with the skin to continue killing germs even after washing.  Oral Hygiene is also important in reducing the risk of infection.  Remember to brush your teeth with your regular toothpaste the morning of  surgery.  Please DO NOT use if you have an allergy to CHG or antibacterial soaps.  If your skin becomes reddened/irritated stop using the CHG and inform your nurse when you arrive at Short Stay.  Do not shave (including legs and underarms) for at least 48 hours prior to the first CHG shower.  You may shave your face.  Please follow these instructions carefully:   1.  Shower with CHG Soap the night before surgery and the morning of Surgery.  2.  If you choose to wash your hair, wash your hair first as usual with your normal shampoo.  3.  After you shampoo, rinse your hair and body thoroughly to remove the shampoo. 4.  Use CHG as you would any other liquid soap.  You can apply chg directly to the skin and wash gently with a      scrungie or washcloth.           5.  Apply the CHG Soap to your body ONLY FROM THE NECK DOWN.   Do not use on open wounds or open sores. Avoid contact with your eyes, ears, mouth and genitals (private parts).  Wash genitals (private parts) with your normal soap.  6.  Wash thoroughly, paying special attention to the area where your surgery will be performed.  7.  Thoroughly rinse your body with warm water from the neck down.  8.  DO NOT shower/wash with your normal soap after using and  rinsing off the CHG Soap.  9.  Pat yourself dry with a clean towel.            10.  Wear clean pajamas.            11.  Place clean sheets on your bed the night of your first shower and do not sleep with pets.  Day of Surgery  Do not apply any lotions/deoderants the morning of surgery.   Please wear clean clothes to the hospital/surgery center. Remember to brush your teeth with toothpaste.    Please read over the following fact sheets that you were given. MRSA Information

## 2019-01-28 ENCOUNTER — Encounter (HOSPITAL_COMMUNITY): Payer: Self-pay

## 2019-01-28 ENCOUNTER — Encounter (HOSPITAL_COMMUNITY): Payer: Self-pay | Admitting: Psychiatry

## 2019-01-28 ENCOUNTER — Other Ambulatory Visit: Payer: Self-pay

## 2019-01-28 ENCOUNTER — Inpatient Hospital Stay (HOSPITAL_COMMUNITY)
Admission: RE | Admit: 2019-01-28 | Discharge: 2019-01-28 | Disposition: A | Discharge status: Home / Self Care | Payer: BC Managed Care – PPO | Source: Ambulatory Visit

## 2019-01-28 ENCOUNTER — Ambulatory Visit (INDEPENDENT_AMBULATORY_CARE_PROVIDER_SITE_OTHER): Payer: BC Managed Care – PPO | Admitting: Psychiatry

## 2019-01-28 DIAGNOSIS — F4310 Post-traumatic stress disorder, unspecified: Secondary | ICD-10-CM | POA: Diagnosis not present

## 2019-01-28 DIAGNOSIS — F5105 Insomnia due to other mental disorder: Secondary | ICD-10-CM

## 2019-01-28 DIAGNOSIS — F900 Attention-deficit hyperactivity disorder, predominantly inattentive type: Secondary | ICD-10-CM

## 2019-01-28 DIAGNOSIS — F330 Major depressive disorder, recurrent, mild: Secondary | ICD-10-CM | POA: Diagnosis not present

## 2019-01-28 DIAGNOSIS — F99 Mental disorder, not otherwise specified: Secondary | ICD-10-CM

## 2019-01-28 HISTORY — DX: Post-traumatic stress disorder, unspecified: F43.10

## 2019-01-28 MED ORDER — HYDROXYZINE HCL 25 MG PO TABS: ORAL_TABLET | ORAL | 0 refills | Status: DC

## 2019-01-28 MED ORDER — DIAZEPAM 10 MG PO TABS: 10.0000 mg | ORAL_TABLET | Freq: Two times a day (BID) | ORAL | 2 refills | Status: DC | PRN

## 2019-01-28 MED ORDER — BUPROPION HCL ER (XL) 150 MG PO TB24: 450.0000 mg | ORAL_TABLET | Freq: Every day | ORAL | 2 refills | Status: DC

## 2019-01-28 NOTE — Progress Notes (Signed)
Virtual Visit via Telephone Note  I connected with Christine Douglas  on 01/28/19 at  9:30 AM EDT by telephone and verified that I am speaking with the correct person using two identifiers.  Location: Patient: home Provider: office   I discussed the limitations, risks, security and privacy concerns of performing an evaluation and management service by telephone and the availability of in person appointments. I also discussed with the patient that there may be a patient responsible charge related to this service. The patient expressed understanding and agreed to proceed.   History of Present Illness: "I am alright". She fell last week while cleaning. She now has a herniated disk and it is causing pain and numbness in her left arm. Christine Douglas will have surgery on Friday. Pt was approved for disability and even though it was a relief, she cried all day. She wants to do some work when she can. She is depressed that she is retired at the age of 45. Pt is working on fixing on her house. Her mom will be going into a NH next April. They are waiting due to [place]. Pt has been taking pain medication and it makes her "Ill". Depression and anxiety are "basicially the same". She is bored and it makes her feel down. As a result she naps during the day. She is starting to have some more nightmares because she is not able to afford Prazosin. She has night sweats due to pain. She was sleeping well until she hurt her back and arm. 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 Pt is having to care for mom on her own. It is very stressful and pt feels like her mom is denying. The Vistaril helps her sleep and is helping with depression. Pt denies SI/HI. The depression is mostly centered on her health issues and seems situational appropriate.     Observations/Objective: I spoke with Christine Douglas on the phone.  Pt was calm, pleasant and cooperative.  Pt was engaged in the  conversation and answered questions appropriately.  Speech was clear and coherent with normal rate, tone and volume.  Mood is depressed and anxious, affect is congruent. Thought processes are coherent and circumstantial.  Thought content is with ruminations.  Pt denies SI/HI.   Pt denies auditory and visual hallucinations and did not appear to be responding to internal stimuli.  Memory and concentration are good.  Fund of knowledge and use of language are average.  Insight and judgment are fair.  I am unable to comment on psychomotor activity, general appearance, hygiene, or eye contact as I was unable to physically see the patient on the phone.  Vital signs not available since interview conducted virtually.     Assessment and Plan: MDD vs Bipolar II d/o; PTSD; Insomnia; AdHD- inattentive type;  r/o Benzo dependence; r/o Borderline PD  Status of current symptoms:mild improvement symptoms  D/c Prazosin due to cost she has not filled it  Vistaril 25mg  po qHS for sleep  Wellbutrin XL 450mg  po qD  Valium 10mg  po BID prn anxiety  ADHD- pt using coping skills for management of symptoms   pt doesn't want her meds changed today   Follow Up Instructions: In 8-12 weeks weeks or sooner if needed   I discussed the assessment and treatment plan with the patient. The patient was provided an opportunity to ask questions and all were answered. The patient agreed with the  plan and demonstrated an understanding of the instructions.   The patient was advised to call back or seek an in-person evaluation if the symptoms worsen or if the condition fails to improve as anticipated.  I provided 20 minutes of non-face-to-face time during this encounter.   Charlcie Cradle, MD

## 2019-02-02 ENCOUNTER — Other Ambulatory Visit (HOSPITAL_COMMUNITY)
Admission: RE | Admit: 2019-02-02 | Discharge: 2019-02-02 | Disposition: A | Discharge status: Home / Self Care | Payer: BC Managed Care – PPO | Source: Ambulatory Visit | Attending: Neurological Surgery | Admitting: Neurological Surgery

## 2019-02-02 DIAGNOSIS — Z20828 Contact with and (suspected) exposure to other viral communicable diseases: Secondary | ICD-10-CM | POA: Diagnosis not present

## 2019-02-02 DIAGNOSIS — Z01812 Encounter for preprocedural laboratory examination: Secondary | ICD-10-CM | POA: Insufficient documentation

## 2019-02-02 LAB — SARS CORONAVIRUS 2 (TAT 6-24 HRS): SARS Coronavirus 2: NEGATIVE

## 2019-02-03 ENCOUNTER — Encounter (HOSPITAL_COMMUNITY): Payer: Self-pay

## 2019-02-03 ENCOUNTER — Encounter (HOSPITAL_COMMUNITY)
Admission: RE | Admit: 2019-02-03 | Discharge: 2019-02-03 | Disposition: A | Discharge status: Home / Self Care | Payer: BC Managed Care – PPO | Source: Ambulatory Visit | Attending: Neurological Surgery | Admitting: Neurological Surgery

## 2019-02-03 ENCOUNTER — Ambulatory Visit (HOSPITAL_COMMUNITY)
Admission: RE | Admit: 2019-02-03 | Discharge: 2019-02-03 | Disposition: A | Discharge status: Home / Self Care | Payer: BC Managed Care – PPO | Source: Ambulatory Visit | Attending: Neurological Surgery | Admitting: Neurological Surgery

## 2019-02-03 ENCOUNTER — Ambulatory Visit (HOSPITAL_COMMUNITY): Admission: RE | Admit: 2019-02-03 | Payer: BC Managed Care – PPO | Source: Ambulatory Visit

## 2019-02-03 ENCOUNTER — Other Ambulatory Visit: Payer: Self-pay

## 2019-02-03 DIAGNOSIS — J9811 Atelectasis: Secondary | ICD-10-CM | POA: Diagnosis not present

## 2019-02-03 DIAGNOSIS — M4802 Spinal stenosis, cervical region: Secondary | ICD-10-CM | POA: Diagnosis not present

## 2019-02-03 LAB — CBC WITH DIFFERENTIAL/PLATELET
Abs Immature Granulocytes: 0.02 10*3/uL (ref 0.00–0.07)
Basophils Absolute: 0.1 10*3/uL (ref 0.0–0.1)
Basophils Relative: 1 %
Eosinophils Absolute: 0.2 10*3/uL (ref 0.0–0.5)
Eosinophils Relative: 2 %
HCT: 41.5 % (ref 36.0–46.0)
Hemoglobin: 13.5 g/dL (ref 12.0–15.0)
Immature Granulocytes: 0 %
Lymphocytes Relative: 25 %
Lymphs Abs: 2.2 10*3/uL (ref 0.7–4.0)
MCH: 28.3 pg (ref 26.0–34.0)
MCHC: 32.5 g/dL (ref 30.0–36.0)
MCV: 87 fL (ref 80.0–100.0)
Monocytes Absolute: 0.6 10*3/uL (ref 0.1–1.0)
Monocytes Relative: 7 %
Neutro Abs: 5.8 10*3/uL (ref 1.7–7.7)
Neutrophils Relative %: 65 %
Platelets: 289 10*3/uL (ref 150–400)
RBC: 4.77 MIL/uL (ref 3.87–5.11)
RDW: 12.9 % (ref 11.5–15.5)
WBC: 9 10*3/uL (ref 4.0–10.5)
nRBC: 0 % (ref 0.0–0.2)

## 2019-02-03 LAB — SURGICAL PCR SCREEN
MRSA, PCR: NEGATIVE
Staphylococcus aureus: NEGATIVE

## 2019-02-03 LAB — BASIC METABOLIC PANEL
Anion gap: 7 (ref 5–15)
BUN: 19 mg/dL (ref 6–20)
CO2: 27 mmol/L (ref 22–32)
Calcium: 9.1 mg/dL (ref 8.9–10.3)
Chloride: 108 mmol/L (ref 98–111)
Creatinine, Ser: 0.85 mg/dL (ref 0.44–1.00)
GFR calc Af Amer: >60 mL/min (ref >60)
GFR calc non Af Amer: >60 mL/min (ref >60)
Glucose, Bld: 89 mg/dL (ref 70–99)
Potassium: 4.2 mmol/L (ref 3.5–5.1)
Sodium: 142 mmol/L (ref 135–145)

## 2019-02-03 LAB — PROTIME-INR
INR: 1 (ref 0.8–1.2)
Prothrombin Time: 13 seconds (ref 11.4–15.2)

## 2019-02-03 LAB — ABO/RH: ABO/RH(D): O POS

## 2019-02-03 LAB — TYPE AND SCREEN
ABO/RH(D): O POS
Antibody Screen: NEGATIVE

## 2019-02-03 NOTE — Pre-Procedure Instructions (Signed)
[id number] - [place], [place] [address] [place] [address] Phone: [phone] Fax: [phone]  [id number] - [place], [place] [address] Phone: [phone] Fax: [phone]    Your procedure is scheduled on Fri., Oct. 2, 2020 from 7:30AM-10:01AM  Report to [facility] Entrance "A" at 5:30AM  Call this number if you have problems the morning of surgery:  [phone]   Remember:  Do not eat or drink after midnight on Oct. 1st    Take these medicines the morning of surgery with A SIP OF WATER: BuPROPion (WELLBUTRIN XL)  GuaiFENesin ([place])   If Needed: Diazepam (VALIUM) HYDROcodone-acetaminophen (NORCO/VICODIN)  As of today, stop taking all Aspirin (unless instructed by your doctor) and Other Aspirin containing products, Vitamins, Fish oils, and Herbal medications. Also stop all NSAIDS i.e. Advil, Ibuprofen, Motrin, Aleve, Anaprox, Naproxen, BC, Goody Powders, and all Supplements. Including: Celecoxib (CELEBREX)  No Tobacco or Alcohol products 24 hours prior to your procedure.  Special instructions: [facility]- Preparing For Surgery  Before surgery, you can play an important role. Because skin is not sterile, your skin needs to be as free of germs as possible. You can reduce the number of germs on your skin by washing with CHG (chlorahexidine gluconate) Soap before surgery.  CHG is an antiseptic cleaner which kills germs and bonds with the skin to continue killing germs even after washing.    Please do not use if you have an allergy to CHG or antibacterial soaps. If your skin becomes reddened/irritated stop using the CHG.  Do not shave (including legs and underarms) for at least 48 hours prior to first CHG shower. It is OK to shave your face.  Please follow these instructions carefully.   1. Shower the NIGHT BEFORE SURGERY and the MORNING OF  SURGERY with CHG.   2. If you chose to wash your hair, wash your hair first as usual with your normal shampoo.  3. After you shampoo, rinse your hair and body thoroughly to remove the shampoo.  4. Use CHG as you would any other liquid soap. You can apply CHG directly to the skin and wash gently with a scrungie or a clean washcloth.   5. Apply the CHG Soap to your body ONLY FROM THE NECK DOWN.  Do not use on open wounds or open sores. Avoid contact with your eyes, ears, mouth and genitals (private parts). Wash Face and genitals (private parts)  with your normal soap.  6. Wash thoroughly, paying special attention to the area where your surgery will be performed.  7. Thoroughly rinse your body with warm water from the neck down.  8. DO NOT shower/wash with your normal soap after using and rinsing off the CHG Soap.  9. Pat yourself dry with a CLEAN TOWEL.  10. Wear CLEAN PAJAMAS to bed the night before surgery, wear comfortable clothes the morning of surgery  11. Place CLEAN SHEETS on your bed the night of your first shower and DO NOT SLEEP WITH PETS.   Day of Surgery:             Remember to brush your teeth WITH YOUR REGULAR TOOTHPASTE.  Do not wear jewelry, make-up or nail polish.  Do not wear lotions, powders, or perfumes, or deodorant.  Do not shave 48 hours prior to surgery.    Do not bring valuables to the hospital.  [facility] is not responsible for any belongings or valuables.  Contacts, dentures or bridgework may not be worn into surgery.  For patients admitted to the hospital, discharge time will be determined by your treatment team.  Patients discharged the day of surgery will not be allowed to drive home, and person 18 and over needs to stay with them for 24 hours.   Please wear clean clothes to the hospital/surgery center.    Please read over the following fact sheets that you were given. Pain Booklet, Coughing and Deep Breathing, MRSA Information and Surgical Site  Infection Prevention

## 2019-02-03 NOTE — Progress Notes (Signed)
PCP - Dr. Clayburn Pert  Cardiologist - Denies  Chest x-ray - 02/03/2019   EKG - 02/03/2019  Stress Test - Denies  ECHO - Denies  Cardiac Cath - Denies  AICD-na PM-na LOOP-na  Sleep Study - Denies CPAP - Denies  LABS- 02/03/2019: CBC w/D, BMP, PT, T/S, PCR 02/02/2019: COVID-GV-Neg  ASA- Denies  ERAS- No  Anesthesia- Yes- EKG  Pt denies having chest pain, sob, or fever at this time. All instructions explained to the pt, with a verbal understanding of the material. Pt agrees to go over the instructions while at home for a better understanding. Pt also instructed to self quarantine after being tested for COVID-19. The opportunity to ask questions was provided.   Coronavirus Screening  Have you experienced the following symptoms:  Cough yes/no: No Fever (>100.90F)  yes/no: No Runny nose yes/no: No Sore throat yes/no: No Difficulty breathing/shortness of breath  yes/no: No  Have you or a family member traveled in the last 14 days and where? yes/no: No   If the patient indicates "YES" to the above questions, their PAT will be rescheduled to limit the exposure to others and, the surgeon will be notified. THE PATIENT WILL NEED TO BE ASYMPTOMATIC FOR 14 DAYS.   If the patient is not experiencing any of these symptoms, the PAT nurse will instruct them to NOT bring anyone with them to their appointment since they may have these symptoms or traveled as well.   Please remind your patients and families that hospital visitation restrictions are in effect and the importance of the restrictions.

## 2019-02-04 ENCOUNTER — Encounter (HOSPITAL_COMMUNITY): Payer: Self-pay | Admitting: Anesthesiology

## 2019-02-04 NOTE — Anesthesia Preprocedure Evaluation (Addendum)
Anesthesia Evaluation  Patient identified by MRN, date of birth, ID band Patient awake    Reviewed: Allergy & Precautions, NPO status , Patient's Chart, lab work & pertinent test results  Airway Mallampati: III  TM Distance: >3 FB Neck ROM: Full    Dental  (+) Teeth Intact, Dental Advisory Given   Pulmonary former smoker,    breath sounds clear to auscultation       Cardiovascular negative cardio ROS   Rhythm:Regular Rate:Normal     Neuro/Psych  Headaches, Anxiety Depression Bipolar Disorder  Neuromuscular disease    GI/Hepatic negative GI ROS, Neg liver ROS,   Endo/Other  negative endocrine ROS  Renal/GU negative Renal ROS     Musculoskeletal negative musculoskeletal ROS (+)   Abdominal Normal abdominal exam  (+)   Peds  Hematology negative hematology ROS (+)   Anesthesia Other Findings   Reproductive/Obstetrics                            Anesthesia Physical Anesthesia Plan  ASA: II  Anesthesia Plan: General   Post-op Pain Management:    Induction: Intravenous  PONV Risk Score and Plan: 4 or greater and Ondansetron, Dexamethasone, Midazolam and Scopolamine patch - Pre-op  Airway Management Planned: Oral ETT and Video Laryngoscope Planned  Additional Equipment: None  Intra-op Plan:   Post-operative Plan: Extubation in OR  Informed Consent: I have reviewed the patients History and Physical, chart, labs and discussed the procedure including the risks, benefits and alternatives for the proposed anesthesia with the patient or authorized representative who has indicated his/her understanding and acceptance.     Dental advisory given  Plan Discussed with: CRNA  Anesthesia Plan Comments:         Anesthesia Quick Evaluation

## 2019-02-05 ENCOUNTER — Inpatient Hospital Stay (HOSPITAL_COMMUNITY): Payer: BC Managed Care – PPO | Admitting: Anesthesiology

## 2019-02-05 ENCOUNTER — Inpatient Hospital Stay (HOSPITAL_COMMUNITY): Payer: BC Managed Care – PPO

## 2019-02-05 ENCOUNTER — Encounter (HOSPITAL_COMMUNITY): Payer: Self-pay

## 2019-02-05 ENCOUNTER — Inpatient Hospital Stay (HOSPITAL_COMMUNITY): Payer: BC Managed Care – PPO | Admitting: Physician Assistant

## 2019-02-05 ENCOUNTER — Other Ambulatory Visit: Payer: Self-pay

## 2019-02-05 ENCOUNTER — Inpatient Hospital Stay (HOSPITAL_COMMUNITY)
Admission: RE | Admit: 2019-02-05 | Discharge: 2019-02-06 | DRG: 473 | Disposition: A | Discharge status: Home / Self Care | Payer: BC Managed Care – PPO | Attending: Neurological Surgery | Admitting: Neurological Surgery

## 2019-02-05 ENCOUNTER — Encounter (HOSPITAL_COMMUNITY)
Admission: RE | Disposition: A | Discharge status: Home / Self Care | Payer: Self-pay | Source: Home / Self Care | Attending: Neurological Surgery

## 2019-02-05 DIAGNOSIS — M4322 Fusion of spine, cervical region: Secondary | ICD-10-CM | POA: Diagnosis not present

## 2019-02-05 DIAGNOSIS — F319 Bipolar disorder, unspecified: Secondary | ICD-10-CM | POA: Diagnosis not present

## 2019-02-05 DIAGNOSIS — Z419 Encounter for procedure for purposes other than remedying health state, unspecified: Secondary | ICD-10-CM

## 2019-02-05 DIAGNOSIS — G2581 Restless legs syndrome: Secondary | ICD-10-CM | POA: Diagnosis present

## 2019-02-05 DIAGNOSIS — M542 Cervicalgia: Secondary | ICD-10-CM | POA: Diagnosis not present

## 2019-02-05 DIAGNOSIS — M4803 Spinal stenosis, cervicothoracic region: Secondary | ICD-10-CM | POA: Diagnosis not present

## 2019-02-05 DIAGNOSIS — Z20828 Contact with and (suspected) exposure to other viral communicable diseases: Secondary | ICD-10-CM | POA: Diagnosis not present

## 2019-02-05 DIAGNOSIS — Z8249 Family history of ischemic heart disease and other diseases of the circulatory system: Secondary | ICD-10-CM

## 2019-02-05 DIAGNOSIS — Z90710 Acquired absence of both cervix and uterus: Secondary | ICD-10-CM

## 2019-02-05 DIAGNOSIS — Z8349 Family history of other endocrine, nutritional and metabolic diseases: Secondary | ICD-10-CM

## 2019-02-05 DIAGNOSIS — F418 Other specified anxiety disorders: Secondary | ICD-10-CM | POA: Diagnosis not present

## 2019-02-05 DIAGNOSIS — Z79891 Long term (current) use of opiate analgesic: Secondary | ICD-10-CM

## 2019-02-05 DIAGNOSIS — F909 Attention-deficit hyperactivity disorder, unspecified type: Secondary | ICD-10-CM | POA: Diagnosis not present

## 2019-02-05 DIAGNOSIS — M47813 Spondylosis without myelopathy or radiculopathy, cervicothoracic region: Secondary | ICD-10-CM | POA: Diagnosis present

## 2019-02-05 DIAGNOSIS — Z79899 Other long term (current) drug therapy: Secondary | ICD-10-CM

## 2019-02-05 DIAGNOSIS — F4310 Post-traumatic stress disorder, unspecified: Secondary | ICD-10-CM | POA: Diagnosis present

## 2019-02-05 DIAGNOSIS — M4802 Spinal stenosis, cervical region: Secondary | ICD-10-CM | POA: Diagnosis not present

## 2019-02-05 DIAGNOSIS — Z87891 Personal history of nicotine dependence: Secondary | ICD-10-CM | POA: Diagnosis not present

## 2019-02-05 DIAGNOSIS — Z981 Arthrodesis status: Secondary | ICD-10-CM

## 2019-02-05 HISTORY — PX: POSTERIOR CERVICAL FUSION/FORAMINOTOMY: SHX5038

## 2019-02-05 SURGERY — POSTERIOR CERVICAL FUSION/FORAMINOTOMY LEVEL 1
Anesthesia: General | Laterality: Left

## 2019-02-05 MED ORDER — ONDANSETRON HCL 4 MG PO TABS: 4.0000 mg | ORAL_TABLET | Freq: Four times a day (QID) | ORAL | Status: DC | PRN

## 2019-02-05 MED ORDER — ACETAMINOPHEN 325 MG PO TABS
650.0000 mg | ORAL_TABLET | ORAL | Status: DC | PRN
  Administered 2019-02-05: 650 mg via ORAL
  Filled 2019-02-05: qty 2

## 2019-02-05 MED ORDER — LACTATED RINGERS IV SOLN: INTRAVENOUS | Status: DC

## 2019-02-05 MED ORDER — MIDAZOLAM HCL 2 MG/2ML IJ SOLN
INTRAMUSCULAR | Status: AC
  Filled 2019-02-05: qty 2

## 2019-02-05 MED ORDER — PHENOL 1.4 % MT LIQD: 1.0000 | OROMUCOSAL | Status: DC | PRN

## 2019-02-05 MED ORDER — THROMBIN 5000 UNITS EX SOLR
CUTANEOUS | Status: AC
  Filled 2019-02-05: qty 15000

## 2019-02-05 MED ORDER — OXYCODONE HCL 5 MG PO TABS
10.0000 mg | ORAL_TABLET | ORAL | Status: DC | PRN
  Administered 2019-02-05 – 2019-02-06 (×4): 10 mg via ORAL
  Filled 2019-02-05 (×4): qty 2

## 2019-02-05 MED ORDER — CHLORHEXIDINE GLUCONATE CLOTH 2 % EX PADS: 6.0000 | MEDICATED_PAD | Freq: Once | CUTANEOUS | Status: DC

## 2019-02-05 MED ORDER — METHOCARBAMOL 500 MG PO TABS
500.0000 mg | ORAL_TABLET | Freq: Four times a day (QID) | ORAL | Status: DC | PRN
  Administered 2019-02-05 – 2019-02-06 (×4): 500 mg via ORAL
  Filled 2019-02-05 (×4): qty 1

## 2019-02-05 MED ORDER — FENTANYL CITRATE (PF) 250 MCG/5ML IJ SOLN
INTRAMUSCULAR | Status: AC
  Filled 2019-02-05: qty 5

## 2019-02-05 MED ORDER — DEXAMETHASONE SODIUM PHOSPHATE 10 MG/ML IJ SOLN
INTRAMUSCULAR | Status: DC | PRN
  Administered 2019-02-05: 10 mg via INTRAVENOUS

## 2019-02-05 MED ORDER — SENNA 8.6 MG PO TABS
1.0000 | ORAL_TABLET | Freq: Two times a day (BID) | ORAL | Status: DC
  Administered 2019-02-05 – 2019-02-06 (×3): 8.6 mg via ORAL
  Filled 2019-02-05 (×3): qty 1

## 2019-02-05 MED ORDER — PROPOFOL 10 MG/ML IV BOLUS
INTRAVENOUS | Status: DC | PRN
  Administered 2019-02-05: 130 mg via INTRAVENOUS

## 2019-02-05 MED ORDER — ONDANSETRON HCL 4 MG/2ML IJ SOLN: 4.0000 mg | Freq: Four times a day (QID) | INTRAMUSCULAR | Status: DC | PRN

## 2019-02-05 MED ORDER — DEXMEDETOMIDINE HCL 200 MCG/2ML IV SOLN
INTRAVENOUS | Status: DC | PRN
  Administered 2019-02-05 (×4): 4 ug via INTRAVENOUS

## 2019-02-05 MED ORDER — ACETAMINOPHEN 10 MG/ML IV SOLN: 1000.0000 mg | Freq: Once | INTRAVENOUS | Status: DC | PRN

## 2019-02-05 MED ORDER — SODIUM CHLORIDE 0.9 % IV SOLN: 250.0000 mL | INTRAVENOUS | Status: DC

## 2019-02-05 MED ORDER — DEXAMETHASONE SODIUM PHOSPHATE 10 MG/ML IJ SOLN
INTRAMUSCULAR | Status: AC
  Filled 2019-02-05: qty 1

## 2019-02-05 MED ORDER — METHOCARBAMOL 1000 MG/10ML IJ SOLN
500.0000 mg | Freq: Four times a day (QID) | INTRAVENOUS | Status: DC | PRN
  Filled 2019-02-05: qty 5

## 2019-02-05 MED ORDER — DEXAMETHASONE SODIUM PHOSPHATE 10 MG/ML IJ SOLN: 10.0000 mg | Freq: Once | INTRAMUSCULAR | Status: DC

## 2019-02-05 MED ORDER — SODIUM CHLORIDE 0.9% FLUSH
3.0000 mL | Freq: Two times a day (BID) | INTRAVENOUS | Status: DC
  Administered 2019-02-05: 13:00:00 3 mL via INTRAVENOUS

## 2019-02-05 MED ORDER — SODIUM CHLORIDE 0.9% FLUSH: 3.0000 mL | INTRAVENOUS | Status: DC | PRN

## 2019-02-05 MED ORDER — DEXAMETHASONE SODIUM PHOSPHATE 4 MG/ML IJ SOLN: 4.0000 mg | Freq: Four times a day (QID) | INTRAMUSCULAR | Status: DC

## 2019-02-05 MED ORDER — BUPIVACAINE HCL (PF) 0.25 % IJ SOLN
INTRAMUSCULAR | Status: AC
  Filled 2019-02-05: qty 30

## 2019-02-05 MED ORDER — MENTHOL 3 MG MT LOZG: 1.0000 | LOZENGE | OROMUCOSAL | Status: DC | PRN

## 2019-02-05 MED ORDER — MIDAZOLAM HCL 2 MG/2ML IJ SOLN
INTRAMUSCULAR | Status: DC | PRN
  Administered 2019-02-05: 2 mg via INTRAVENOUS

## 2019-02-05 MED ORDER — 0.9 % SODIUM CHLORIDE (POUR BTL) OPTIME
TOPICAL | Status: DC | PRN
  Administered 2019-02-05: 1000 mL

## 2019-02-05 MED ORDER — OXYCODONE HCL 5 MG PO TABS
5.0000 mg | ORAL_TABLET | ORAL | Status: DC | PRN
  Administered 2019-02-05 (×3): 5 mg via ORAL
  Filled 2019-02-05 (×3): qty 1

## 2019-02-05 MED ORDER — CEFAZOLIN SODIUM-DEXTROSE 2-4 GM/100ML-% IV SOLN: 2.0000 g | Freq: Three times a day (TID) | INTRAVENOUS | Status: AC

## 2019-02-05 MED ORDER — THROMBIN 5000 UNITS EX SOLR
CUTANEOUS | Status: DC | PRN
  Administered 2019-02-05 (×2): 5000 [IU] via TOPICAL

## 2019-02-05 MED ORDER — LACTATED RINGERS IV SOLN
INTRAVENOUS | Status: DC | PRN
  Administered 2019-02-05: 07:00:00 via INTRAVENOUS

## 2019-02-05 MED ORDER — PROPOFOL 10 MG/ML IV BOLUS
INTRAVENOUS | Status: AC
  Filled 2019-02-05: qty 20

## 2019-02-05 MED ORDER — BUPROPION HCL ER (XL) 300 MG PO TB24
450.0000 mg | ORAL_TABLET | Freq: Every day | ORAL | Status: DC
  Administered 2019-02-06: 450 mg via ORAL
  Filled 2019-02-05 (×2): qty 1

## 2019-02-05 MED ORDER — MORPHINE SULFATE (PF) 2 MG/ML IV SOLN: 2.0000 mg | INTRAVENOUS | Status: DC | PRN

## 2019-02-05 MED ORDER — ACETAMINOPHEN 10 MG/ML IV SOLN
INTRAVENOUS | Status: DC | PRN
  Administered 2019-02-05: 1000 mg via INTRAVENOUS

## 2019-02-05 MED ORDER — FENTANYL CITRATE (PF) 250 MCG/5ML IJ SOLN
INTRAMUSCULAR | Status: DC | PRN
  Administered 2019-02-05: 50 ug via INTRAVENOUS
  Administered 2019-02-05: 25 ug via INTRAVENOUS
  Administered 2019-02-05: 100 ug via INTRAVENOUS
  Administered 2019-02-05: 25 ug via INTRAVENOUS
  Administered 2019-02-05: 50 ug via INTRAVENOUS
  Administered 2019-02-05 (×2): 25 ug via INTRAVENOUS
  Administered 2019-02-05 (×2): 50 ug via INTRAVENOUS

## 2019-02-05 MED ORDER — CEFAZOLIN SODIUM-DEXTROSE 2-4 GM/100ML-% IV SOLN
INTRAVENOUS | Status: AC
  Filled 2019-02-05: qty 100

## 2019-02-05 MED ORDER — ACETAMINOPHEN 325 MG PO TABS: 325.0000 mg | ORAL_TABLET | Freq: Once | ORAL | Status: DC | PRN

## 2019-02-05 MED ORDER — SODIUM CHLORIDE 0.9 % IV SOLN
INTRAVENOUS | Status: DC | PRN
  Administered 2019-02-05: 500 mL

## 2019-02-05 MED ORDER — ROCURONIUM BROMIDE 10 MG/ML (PF) SYRINGE
PREFILLED_SYRINGE | INTRAVENOUS | Status: DC | PRN
  Administered 2019-02-05: 50 mg via INTRAVENOUS

## 2019-02-05 MED ORDER — CELECOXIB 200 MG PO CAPS
200.0000 mg | ORAL_CAPSULE | Freq: Two times a day (BID) | ORAL | Status: DC
  Administered 2019-02-05 – 2019-02-06 (×3): 200 mg via ORAL
  Filled 2019-02-05 (×3): qty 1

## 2019-02-05 MED ORDER — ACETAMINOPHEN 650 MG RE SUPP: 650.0000 mg | RECTAL | Status: DC | PRN

## 2019-02-05 MED ORDER — POTASSIUM CHLORIDE IN NACL 20-0.9 MEQ/L-% IV SOLN: INTRAVENOUS | Status: DC

## 2019-02-05 MED ORDER — HEMOSTATIC AGENTS (NO CHARGE) OPTIME
TOPICAL | Status: DC | PRN
  Administered 2019-02-05: 1 via TOPICAL

## 2019-02-05 MED ORDER — THROMBIN 5000 UNITS EX SOLR
OROMUCOSAL | Status: DC | PRN
  Administered 2019-02-05: 5 mL via TOPICAL

## 2019-02-05 MED ORDER — SUGAMMADEX SODIUM 200 MG/2ML IV SOLN
INTRAVENOUS | Status: DC | PRN
  Administered 2019-02-05: 150 mg via INTRAVENOUS

## 2019-02-05 MED ORDER — MEPERIDINE HCL 25 MG/ML IJ SOLN: 6.2500 mg | INTRAMUSCULAR | Status: DC | PRN

## 2019-02-05 MED ORDER — KETOROLAC TROMETHAMINE 15 MG/ML IJ SOLN
INTRAMUSCULAR | Status: DC | PRN
  Administered 2019-02-05: 15 mg via INTRAVENOUS

## 2019-02-05 MED ORDER — ONDANSETRON HCL 4 MG/2ML IJ SOLN
INTRAMUSCULAR | Status: DC | PRN
  Administered 2019-02-05: 4 mg via INTRAVENOUS

## 2019-02-05 MED ORDER — LIDOCAINE 2% (20 MG/ML) 5 ML SYRINGE
INTRAMUSCULAR | Status: DC | PRN
  Administered 2019-02-05: 50 mg via INTRAVENOUS

## 2019-02-05 MED ORDER — CEFAZOLIN SODIUM-DEXTROSE 2-4 GM/100ML-% IV SOLN
2.0000 g | INTRAVENOUS | Status: AC
  Administered 2019-02-05: 2 g via INTRAVENOUS

## 2019-02-05 MED ORDER — PROMETHAZINE HCL 25 MG/ML IJ SOLN: 6.2500 mg | INTRAMUSCULAR | Status: DC | PRN

## 2019-02-05 MED ORDER — DEXAMETHASONE 4 MG PO TABS
4.0000 mg | ORAL_TABLET | Freq: Four times a day (QID) | ORAL | Status: DC
  Administered 2019-02-05 – 2019-02-06 (×4): 4 mg via ORAL
  Filled 2019-02-05 (×4): qty 1

## 2019-02-05 MED ORDER — HYDROMORPHONE HCL 1 MG/ML IJ SOLN
INTRAMUSCULAR | Status: AC
  Filled 2019-02-05: qty 1

## 2019-02-05 MED ORDER — ACETAMINOPHEN 160 MG/5ML PO SOLN: 325.0000 mg | Freq: Once | ORAL | Status: DC | PRN

## 2019-02-05 MED ORDER — SODIUM CHLORIDE 0.9 % IV SOLN
INTRAVENOUS | Status: DC | PRN
  Administered 2019-02-05: 08:00:00 15 ug/min via INTRAVENOUS

## 2019-02-05 MED ORDER — HYDROMORPHONE HCL 1 MG/ML IJ SOLN
0.2500 mg | INTRAMUSCULAR | Status: DC | PRN
  Administered 2019-02-05: 0.5 mg via INTRAVENOUS

## 2019-02-05 SURGICAL SUPPLY — 63 items
ADH SKN CLS APL DERMABOND .7 (GAUZE/BANDAGES/DRESSINGS) ×1
APL SKNCLS STERI-STRIP NONHPOA (GAUZE/BANDAGES/DRESSINGS) ×1
BAG DECANTER FOR FLEXI CONT (MISCELLANEOUS) ×2 IMPLANT
BASKET BONE COLLECTION (BASKET) ×1 IMPLANT
BENZOIN TINCTURE PRP APPL 2/3 (GAUZE/BANDAGES/DRESSINGS) ×2 IMPLANT
BLADE CLIPPER SURG (BLADE) IMPLANT
BUR MATCHSTICK NEURO 3.0 LAGG (BURR) ×1 IMPLANT
CANISTER SUCT 3000ML PPV (MISCELLANEOUS) ×2 IMPLANT
CARTRIDGE OIL MAESTRO DRILL (MISCELLANEOUS) ×1 IMPLANT
COVER WAND RF STERILE (DRAPES) ×2 IMPLANT
DERMABOND ADVANCED (GAUZE/BANDAGES/DRESSINGS) ×1
DERMABOND ADVANCED .7 DNX12 (GAUZE/BANDAGES/DRESSINGS) IMPLANT
DIFFUSER DRILL AIR PNEUMATIC (MISCELLANEOUS) ×2 IMPLANT
DRAPE C-ARM 42X72 X-RAY (DRAPES) ×6 IMPLANT
DRAPE LAPAROTOMY 100X72 PEDS (DRAPES) ×2 IMPLANT
DRSG OPSITE POSTOP 4X6 (GAUZE/BANDAGES/DRESSINGS) ×1 IMPLANT
DURAPREP 26ML APPLICATOR (WOUND CARE) ×2 IMPLANT
ELECT REM PT RETURN 9FT ADLT (ELECTROSURGICAL) ×2
ELECTRODE REM PT RTRN 9FT ADLT (ELECTROSURGICAL) ×1 IMPLANT
EVACUATOR 1/8 PVC DRAIN (DRAIN) IMPLANT
GAUZE 4X4 16PLY RFD (DISPOSABLE) IMPLANT
GLOVE BIO SURGEON STRL SZ7 (GLOVE) ×1 IMPLANT
GLOVE BIO SURGEON STRL SZ8 (GLOVE) ×2 IMPLANT
GLOVE BIOGEL PI IND STRL 7.0 (GLOVE) IMPLANT
GLOVE BIOGEL PI IND STRL 7.5 (GLOVE) IMPLANT
GLOVE BIOGEL PI INDICATOR 7.0 (GLOVE) ×1
GLOVE BIOGEL PI INDICATOR 7.5 (GLOVE) ×2
GLOVE SURG SS PI 7.0 STRL IVOR (GLOVE) ×1 IMPLANT
GLOVE SURG SS PI 7.5 STRL IVOR (GLOVE) ×2 IMPLANT
GOWN STRL REUS W/ TWL LRG LVL3 (GOWN DISPOSABLE) IMPLANT
GOWN STRL REUS W/ TWL XL LVL3 (GOWN DISPOSABLE) ×1 IMPLANT
GOWN STRL REUS W/TWL 2XL LVL3 (GOWN DISPOSABLE) IMPLANT
GOWN STRL REUS W/TWL LRG LVL3 (GOWN DISPOSABLE) ×6
GOWN STRL REUS W/TWL XL LVL3 (GOWN DISPOSABLE) ×2
HEMOSTAT POWDER KIT SURGIFOAM (HEMOSTASIS) ×1 IMPLANT
KIT BASIN OR (CUSTOM PROCEDURE TRAY) ×2 IMPLANT
KIT TURNOVER KIT B (KITS) ×2 IMPLANT
MARKER SKIN DUAL TIP RULER LAB (MISCELLANEOUS) ×2 IMPLANT
NDL HYPO 25X1 1.5 SAFETY (NEEDLE) ×1 IMPLANT
NDL SPNL 20GX3.5 QUINCKE YW (NEEDLE) IMPLANT
NEEDLE HYPO 25X1 1.5 SAFETY (NEEDLE) ×2 IMPLANT
NEEDLE SPNL 20GX3.5 QUINCKE YW (NEEDLE) IMPLANT
NS IRRIG 1000ML POUR BTL (IV SOLUTION) ×2 IMPLANT
OIL CARTRIDGE MAESTRO DRILL (MISCELLANEOUS) ×2
PACK LAMINECTOMY NEURO (CUSTOM PROCEDURE TRAY) ×2 IMPLANT
PAD ARMBOARD 7.5X6 YLW CONV (MISCELLANEOUS) ×1 IMPLANT
PIN MAYFIELD SKULL DISP (PIN) ×2 IMPLANT
PUTTY DBM 5CC ×1 IMPLANT
ROD LORD TI 3.5X25 (Rod) ×2 IMPLANT
SCREW PA 3.5X14 (Screw) ×2 IMPLANT
SCREW PA 3.5X20 (Screw) ×1 IMPLANT
SCREW PA 3.5X22 (Screw) ×1 IMPLANT
SCREW SET ATEC (Screw) ×4 IMPLANT
SPONGE LAP 4X18 RFD (DISPOSABLE) IMPLANT
SPONGE SURGIFOAM ABS GEL SZ50 (HEMOSTASIS) ×2 IMPLANT
STRIP CLOSURE SKIN 1/2X4 (GAUZE/BANDAGES/DRESSINGS) ×2 IMPLANT
SUT VIC AB 0 CT1 18XCR BRD8 (SUTURE) ×1 IMPLANT
SUT VIC AB 0 CT1 8-18 (SUTURE) ×2
SUT VIC AB 2-0 CP2 18 (SUTURE) ×2 IMPLANT
SUT VIC AB 3-0 SH 8-18 (SUTURE) ×2 IMPLANT
TOWEL GREEN STERILE (TOWEL DISPOSABLE) ×2 IMPLANT
TOWEL GREEN STERILE FF (TOWEL DISPOSABLE) ×2 IMPLANT
WATER STERILE IRR 1000ML POUR (IV SOLUTION) ×2 IMPLANT

## 2019-02-05 NOTE — Anesthesia Postprocedure Evaluation (Signed)
Anesthesia Post Note  Patient: Christine Douglas  Procedure(s) Performed: Posterior cervical fusion with lateral mass fixation - Cervical seven-Thoracic one with left foraminotomy/ diskectomy (Left )     Patient location during evaluation: PACU Anesthesia Type: General Level of consciousness: awake and alert Pain management: pain level controlled Vital Signs Assessment: post-procedure vital signs reviewed and stable Respiratory status: spontaneous breathing, nonlabored ventilation, respiratory function stable and patient connected to nasal cannula oxygen Cardiovascular status: blood pressure returned to baseline and stable Postop Assessment: no apparent nausea or vomiting Anesthetic complications: no    Last Vitals:  Vitals:   02/05/19 1050 02/05/19 1128  BP:  138/74  Pulse:  73  Resp:  18  Temp:  36.4 C  SpO2: 100%     Last Pain:  Vitals:   02/05/19 1341  TempSrc:   PainSc: [name] [name]

## 2019-02-05 NOTE — Anesthesia Procedure Notes (Signed)
Procedure Name: Intubation Date/Time: 02/05/2019 7:48 AM Performed by: Clearnce Sorrel, CRNA Pre-anesthesia Checklist: Patient identified, Emergency Drugs available, Suction available, Patient being monitored and Timeout performed Patient Re-evaluated:Patient Re-evaluated prior to induction Oxygen Delivery Method: Circle system utilized Preoxygenation: Pre-oxygenation with 100% oxygen Induction Type: IV induction Ventilation: Mask ventilation without difficulty Laryngoscope Size: Glidescope and 3 (limited neck ROM) Grade View: Grade I Tube type: Oral Tube size: 7.0 mm Number of attempts: 1 Airway Equipment and Method: Stylet Placement Confirmation: ETT inserted through vocal cords under direct vision,  breath sounds checked- equal and bilateral and positive ETCO2 Secured at: 22 cm Tube secured with: Tape Dental Injury: Teeth and Oropharynx as per pre-operative assessment

## 2019-02-05 NOTE — Evaluation (Signed)
Physical Therapy Evaluation Patient Details Name: Christine Douglas MRN: [id number] DOB: Sep 17, 1960 Today's Date: 02/05/2019   History of Present Illness  Pt is a 58 y/o female s/p C7-T1 posterior cervical fusion. PMH includes PTSD and spinal surgery.   Clinical Impression  Patient is s/p above surgery resulting in the deficits listed below (see PT Problem List). Pt with unsteadiness requiring min guard A for mobility tasks. Also required multiple cues and repetition for reminders of precautions throughout mobility; may be secondary to medication affects. 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 Patient will benefit from skilled PT to increase their independence and safety with mobility (while adhering to their precautions) to allow discharge to the venue listed below.     Follow Up Recommendations No PT follow up    Equipment Recommendations  None recommended by PT    Recommendations for Other Services       Precautions / Restrictions Precautions Precautions: Cervical Precaution Booklet Issued: Yes (comment) Precaution Comments: Reviewed cervical precautions, however, pt requiring reminders to maintain during session.  Restrictions Weight Bearing Restrictions: No      Mobility  Bed Mobility Overal bed mobility: Needs Assistance Bed Mobility: Sit to Sidelying;Rolling Rolling: Supervision       Sit to sidelying: Supervision General bed mobility comments: Pt sitting at EOB upon entry. Required step by step cues for using log roll technique to return to supine.   Transfers Overall transfer level: Needs assistance Equipment used: None Transfers: Sit to/from Stand Sit to Stand: Min guard         General transfer comment: Min guard for steadying assist.   Ambulation/Gait Ambulation/Gait assistance: Min guard Gait Distance (Feet): 120 Feet Assistive device: None Gait Pattern/deviations: Step-through  pattern;Decreased stance time - left;Drifts right/left Gait velocity: Decreased    General Gait Details: Pt with unsteadiness, however, likely secondary to pain medication. Min guard for safety. Pt also reporting some dizziness which limited ambulation tolerance.   Stairs            Wheelchair Mobility    Modified Rankin (Stroke Patients Only)       Balance Overall balance assessment: Needs assistance Sitting-balance support: No upper extremity supported;Feet supported Sitting balance-Leahy Scale: Good     Standing balance support: No upper extremity supported;During functional activity Standing balance-Leahy Scale: Fair Standing balance comment: able to maintain static standing                              Pertinent Vitals/Pain Pain Assessment: Faces Faces Pain Scale: Hurts a little bit Pain Location: neck  Pain Descriptors / Indicators: Aching;Operative site guarding Pain Intervention(s): Limited activity within patient's tolerance;Monitored during session;Repositioned    Home Living Family/patient expects to be discharged to:: Private residence Living Arrangements: Parent Available Help at Discharge: Family;Available PRN/intermittently Type of Home: Apartment Home Access: Level entry     Home Layout: One level Home Equipment: None Additional Comments: Mother is bed bound. 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      Hand Dominance        Extremity/Trunk Assessment   Upper Extremity Assessment Upper Extremity Assessment: Defer to OT evaluation    Lower Extremity Assessment Lower Extremity Assessment: Generalized weakness    Cervical /  Trunk Assessment Cervical / Trunk Assessment: Other exceptions Cervical / Trunk Exceptions: s/p cervical surgery   Communication   Communication: No difficulties  Cognition  Arousal/Alertness: Awake/alert Behavior During Therapy: WFL for tasks assessed/performed Overall Cognitive Status: No family/caregiver present to determine baseline cognitive functioning                                 General Comments: Pt requiring repetition of precautions throughout session. Pt responding with unrelated answers to some home information; i.e. When asked about steps, pt stated, "oh, I haven't been anywhere because of COVID." and did not answer questions about steps until after repeating question.       General Comments General comments (skin integrity, edema, etc.): Educated about walking program to perform at home     Exercises     Assessment/Plan    PT Assessment Patient needs continued PT services  PT Problem List Decreased balance;Decreased mobility;Decreased knowledge of precautions;Decreased safety awareness;Decreased activity tolerance;Pain       PT Treatment Interventions Gait training;Functional mobility training;Therapeutic activities;Therapeutic exercise;Balance training;Patient/family education    PT Goals (Current goals can be found in the Care Plan section)  Acute Rehab PT Goals Patient Stated Goal: to go home PT Goal Formulation: With patient Time For Goal Achievement: 02/19/19 Potential to Achieve Goals: Good    Frequency Min 5X/week   Barriers to discharge        Co-evaluation               AM-PAC PT "6 Clicks" Mobility  Outcome Measure Help needed turning from your back to your side while in a flat bed without using bedrails?: A Little Help needed moving from lying on your back to sitting on the side of a flat bed without using bedrails?: A Little Help needed moving to and from a bed to a chair (including a wheelchair)?: A Little Help needed standing up from a chair using your arms (e.g., wheelchair or bedside chair)?: A Little Help needed to walk in hospital room?: A Little Help needed climbing 3-5 steps with a  railing? : A Little 6 Click Score: 18    End of Session   Activity Tolerance: Treatment limited secondary to medical complications (Comment)(dizziness) Patient left: in bed;with call bell/phone within reach;with SCD's reapplied Nurse Communication: Mobility status PT Visit Diagnosis: Unsteadiness on feet (R26.81);Other abnormalities of gait and mobility (R26.89)    Time: 2229-7989 PT Time Calculation (min) (ACUTE ONLY): 13 min   Charges:   PT Evaluation $PT Eval Low Complexity: 1 Low          Gladys Damme, PT, DPT  Acute Rehabilitation Services  Pager: [phone] Office: [phone]   Lehman Prom 02/05/2019, 3:39 PM

## 2019-02-05 NOTE — Transfer of Care (Signed)
Immediate Anesthesia Transfer of Care Note  Patient: Christine Douglas  Procedure(s) Performed: Posterior cervical fusion with lateral mass fixation - Cervical seven-Thoracic one with left foraminotomy/ diskectomy (Left )  Patient Location: PACU  Anesthesia Type:General  Level of Consciousness: awake, alert  and oriented  Airway & Oxygen Therapy: Patient Spontanous Breathing and Patient connected to nasal cannula oxygen  Post-op Assessment: Report given to RN and Post -op Vital signs reviewed and stable  Post vital signs: Reviewed and stable  Last Vitals:  Vitals Value Taken Time  BP 131/96 02/05/19 1018  Temp    Pulse 99 02/05/19 1019  Resp 19 02/05/19 1019  SpO2 98 % 02/05/19 1019  Vitals shown include unvalidated device data.  Last Pain:  Vitals:   02/05/19 0649  TempSrc:   PainSc: 0-No pain      Patients Stated Pain Goal: 4 (34/19/37 9024)  Complications: No apparent anesthesia complications

## 2019-02-05 NOTE — H&P (Signed)
Subjective:   Patient is a 58 y.o. female admitted for neck and L arm pain. The patient first presented to me with complaints of neck pain, shooting pains in the arm(s), numbness of the arm(s) and loss of strength of the arm(s). Onset of symptoms was several months ago. The pain is described as aching and occurs all day. The pain is rated severe, and is located in the neck and radiates to the LUE. The symptoms have been progressive. Symptoms are exacerbated by extending head backwards, and are relieved by none.  Previous work up includes MRI of cervical spine, results: spinal stenosis.  Past Medical History:  Diagnosis Date  . ADHD (attention deficit hyperactivity disorder)   . Anxiety   . BV (bacterial vaginosis)   . Depression   . Headache   . IBS (irritable bowel syndrome)   . Neuromuscular disorder ([place])    nerve damage C6  C7  . PTSD (post-traumatic stress disorder)   . Restless leg syndrome   . TO (tubal occlusion)     Past Surgical History:  Procedure Laterality Date  . ABDOMINAL HYSTERECTOMY    . BACK SURGERY     spine surgery; 2 screws and plate in neck  . CESAREAN SECTION    . LAPAROSCOPIC BILATERAL SALPINGO OOPHERECTOMY Bilateral 06/10/2017   Procedure: LAPAROSCOPIC BILATERAL OOPHORECTOMY;  Surgeon: Jonnie Kind, MD;  Location: [facility];  Service: Gynecology;  Laterality: Bilateral;  . SHOULDER ARTHROSCOPY WITH ROTATOR CUFF REPAIR AND SUBACROMIAL DECOMPRESSION  06/04/2012   Procedure: SHOULDER ARTHROSCOPY WITH ROTATOR CUFF REPAIR AND SUBACROMIAL DECOMPRESSION;  Surgeon: Ninetta Lights, MD;  Location: [facility];  Service: Orthopedics;  Laterality: Left;  LEFT ARTHROSCOPY SHOULDER DECOMPRESSION SUBACROMIAL PARTIAL ACROMIOPLASTY WITH CORACOACROMIAL RELEASE, DISTAL CLAVICULECTOMY,  ROTATOR CUFF REPAIR   . TUBAL LIGATION      Allergies  Allergen Reactions  . Anesthetics, [place] Anaphylaxis    Neck surgery pt unsure of exact anesthetic-afraid to have any further  surgeries; Feels weird for a while  . Flexeril [Cyclobenzaprine] Swelling    Cannot take muscle relaxers -- swelling of legs and body     Social History   Tobacco Use  . Smoking status: Former Smoker    Types: Cigarettes  . Smokeless tobacco: Never Used  Substance Use Topics  . Alcohol use: Yes    Frequency: Never    Comment: occ    Family History  Problem Relation Age of Onset  . Hyperlipidemia Mother   . Thyroid disease Mother   . Other Brother        suicide  . Heart attack Father   . Bipolar disorder Son   . Cancer Other   . Dementia Maternal Aunt    Prior to Admission medications   Medication Sig Start Date End Date Taking? Authorizing Provider  buPROPion (WELLBUTRIN XL) 150 MG 24 hr tablet Take 3 tablets (450 mg total) by mouth daily. 01/28/19  Yes Charlcie Cradle, MD  celecoxib (CELEBREX) 200 MG capsule Take 200 mg by mouth daily.   Yes [provider]  diazepam (VALIUM) 10 MG tablet Take 1 tablet (10 mg total) by mouth every 12 (twelve) hours as needed for anxiety. 01/28/19  Yes Charlcie Cradle, MD  guaiFENesin (MUCINEX) 600 MG 12 hr tablet Take 600 mg by mouth daily.   Yes [provider]  HYDROcodone-acetaminophen (NORCO/VICODIN) 5-325 MG tablet Take 1 tablet by mouth every 6 (six) hours as needed for moderate pain (pain.).   Yes [provider]  hydrOXYzine (ATARAX/VISTARIL) 25 MG tablet TAKE 1 TABLET BY MOUTH EVERYDAY AT BEDTIME 01/28/19  Yes Oletta Darter, MD  prazosin (MINIPRESS) 1 MG capsule Take 3 capsules (3 mg total) by mouth at bedtime. Patient not taking: Reported on 01/25/2019 11/19/18   Oletta Darter, MD     Review of Systems  Positive ROS: neg  All other systems have been reviewed and were otherwise negative with the exception of those mentioned in the HPI and as above.  Objective: Vital signs in last 24 hours: Temp:  [97.6 F (36.4 C)] 97.6 F (36.4 C) (10/02 0601) Pulse Rate:  [84] 84 (10/02 0601) Resp:  [18] 18  (10/02 0601) BP: (111)/(82) 111/82 (10/02 0601) SpO2:  [96 %] 96 % (10/02 0601) Weight:  [68.5 kg] 68.5 kg (10/02 0601)  General Appearance: Alert, cooperative, no distress, appears stated age Head: Normocephalic, without obvious abnormality, atraumatic Eyes: PERRL, conjunctiva/corneas clear, EOM's intact      Neck: Supple, symmetrical, trachea midline, Back: Symmetric, no curvature, ROM normal, no CVA tenderness Lungs:  respirations unlabored Heart: Regular rate and rhythm Abdomen: Soft, non-tender Extremities: Extremities normal, atraumatic, no cyanosis or edema Pulses: 2+ and symmetric all extremities Skin: Skin color, texture, turgor normal, no rashes or lesions  NEUROLOGIC:  Mental status: Alert and oriented x4, no aphasia, good attention span, fund of knowledge and memory  Motor Exam - grossly normal Sensory Exam - grossly normal Reflexes: neg Coordination - grossly normal Gait - grossly normal Balance - grossly normal Cranial Nerves: I: smell Not tested  II: visual acuity  OS: nl    OD: nl  II: visual fields Full to confrontation  II: pupils Equal, round, reactive to light  III,VII: ptosis None  III,IV,VI: extraocular muscles  Full ROM  V: mastication Normal  V: facial light touch sensation  Normal  V,VII: corneal reflex  Present  VII: facial muscle function - upper  Normal  VII: facial muscle function - lower Normal  VIII: hearing Not tested  IX: soft palate elevation  Normal  IX,X: gag reflex Present  XI: trapezius strength  5/5  XI: sternocleidomastoid strength 5/5  XI: neck flexion strength  5/5  XII: tongue strength  Normal    Data Review Lab Results  Component Value Date   WBC 9.0 02/03/2019   HGB 13.5 02/03/2019   HCT 41.5 02/03/2019   MCV 87.0 02/03/2019   PLT 289 02/03/2019   Lab Results  Component Value Date   NA 142 02/03/2019   K 4.2 02/03/2019   CL 108 02/03/2019   CO2 27 02/03/2019   BUN 19 02/03/2019   CREATININE 0.85 02/03/2019    GLUCOSE 89 02/03/2019   Lab Results  Component Value Date   INR 1.0 02/03/2019    Assessment:   Cervical neck pain with 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 Estimated body mass index is 25.92 kg/m as calculated from the following:   Height as of this encounter: 5\' 4"  (1.626 m).   Weight as of this encounter: 68.5 kg.  Patient has failed conservative therapy. Planned surgery : CL and PCF C7-T1  Plan:   I explained the condition and procedure to the patient and answered any questions.  Patient wishes to proceed with procedure as planned. Understands risks/ benefits/ and expected or typical outcomes.  02/05/2019 7:35 AM

## 2019-02-05 NOTE — Op Note (Signed)
02/05/2019  10:13 AM  PATIENT:  Christine Douglas  58 y.o. 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  Posterior cervical arthrodesis C7-T1 utilizing morselized autograft and allograft, 2.  Decompressive laminectomy medial facetectomy foraminotomies C7-T1 bilaterally, 3.  Nonsegmental fixation C7-T1 utilizing Alphatec lateral mass screws at C7 and pedicle screws at T1  SURGEON:  Sherley Bounds, MD  ASSISTANTS: Glenford Peers, FNP  ANESTHESIA:   General  EBL: 50 ml  Total I/O In: 600 [I.V.:600] Out: 50 [Blood:50]  BLOOD ADMINISTERED: none  DRAINS: None  SPECIMEN:  none  INDICATION FOR PROCEDURE: This patient presented with neck pain with left arm pain. Imaging showed adjacent level spondylosis with neuroforaminal stenosis C7-T1. The patient tried conservative measures without relief. Pain was debilitating. Recommended posterior cervical decompression and fusion C7-T1. Patient understood the risks, benefits, and alternatives and potential outcomes and wished to proceed.  PROCEDURE DETAILS: The patient was brought to the operating room. Generalized endotracheal anesthesia was induced. The patient was affixed a 3 point [place] headrest and rolled into the prone position on chest rolls. All pressure points were padded. The posterior cervical region was cleaned and prepped with DuraPrep and then draped in the usual sterile fashion. 7 cc of local anesthesia was injected and a dorsal midline incision made in the posterior cervical region and carried down to the cervical fascia. The fascia was opened and the paraspinous musculature was taken down to expose C7-T1. Intraoperative fluoroscopy confirmed my level and then the dissection was carried out over the lateral facets. I localized the midpoint of each lateral mass of C7 and marked a region 1 mm medial to the midpoint of  the lateral mass, and then drilled in an upward and outward direction into the safe zone of each lateral mass. I drilled to a depth of 14 mm and then checked my drill hole with a ball probe. I then placed a 14 mm lateral mass screws into the safe zone of each lateral mass of C7  until they were 2 fingers tight.  Keyhole foraminotomies were done bilaterally and medial facetectomies were then  performed, and foraminotomies were performed at C7-T1 bilaterally.  The drill shavings were saved in a mucous trap for later arthrodesis.  Much more generous decompression was performed on the left because of her left-sided symptoms.  We palpated under the nerve root and inspected the axilla of the nerve root and found a disc bulge but no significant herniation.  The nerve hook passed easily.  We then used AP and lateral fluoroscopy to identify the starting points of the T1 pedicle screws.  We used a hand drill to drill up to 16 mm and then tapped and then placed 3.5 x 22 mm pedicle screws at T1.  I then decorticated the lateral masses and the facet joints and packed them with local autograft and morcellized allograft to perform arthrodesis from C7-T1. I then placed rods into the multiaxial screw heads of the screws and locked these into position with the locking caps and anti-torque device. I then checked the final construct with AP/Lat fluoroscopy. I irrigated with saline solution containing bacitracin. I, lined the dura with Gelfoam. After hemostasis was achieved I closed the muscle and the fascia with 0 Vicryl, subcutaneous tissue with 2-0 Vicryl, and the subcuticular tissue with 3-0 Vicryl. The skin was closed with benzoin and Steri-Strips. A sterile dressing was applied, the patient  was turned to the supine position and taken out of the headrest, awakened from general anesthesia and transferred to the recovery room in stable condition. At the end of the procedure all sponge, needle and instrument counts were  correct.    PLAN OF CARE: Admit for overnight observation  PATIENT DISPOSITION:  PACU - hemodynamically stable.   Delay start of Pharmacological VTE agent (>24hrs) due to surgical blood loss or risk of bleeding:  yes

## 2019-02-06 MED ORDER — POLYETHYLENE GLYCOL 3350 17 G PO PACK
17.0000 g | PACK | Freq: Every day | ORAL | Status: DC
  Administered 2019-02-06: 17 g via ORAL

## 2019-02-06 NOTE — Progress Notes (Addendum)
Physical Therapy Treatment/ Discharge Patient Details Name: Christine Douglas MRN: [id number] DOB: 14-Feb-1961 Today's Date: 02/06/2019    History of Present Illness (P) Pt is a 58 y/o female s/p C7-T1 posterior cervical fusion. PMH includes PTSD and spinal surgery.     PT Comments    Pt very pleasant and eager to mobilize. Pt reports she is feeling better than last date and that she has set up assist for mom at home so she will not be responsible for assisting with mobility. Pt educated for all precautions with limited demonstration of reception to education. Pt moving well and able to perform all transfers and gait without assist with goals met and no further acute therapy needs. Will sign off with pt aware and agreeable.    Follow Up Recommendations  No PT follow up     Equipment Recommendations  None recommended by PT    Recommendations for Other Services       Precautions / Restrictions Precautions Precautions: (P) Cervical Precaution Comments: educated for precautions    Mobility  Bed Mobility   Bed Mobility: Supine to Sit;Sit to Supine     Supine to sit: Supervision Sit to supine: Supervision   General bed mobility comments: supervision from high bed to simulate home with cues for sequence and side<>sit  Transfers Overall transfer level: Modified independent                  Ambulation/Gait Ambulation/Gait assistance: Modified independent (Device/Increase time) Gait Distance (Feet): 500 Feet Assistive device: None Gait Pattern/deviations: WFL(Within Functional Limits)   Gait velocity interpretation: >2.62 ft/sec, indicative of community ambulatory General Gait Details: one partial LOB with pt able to recover on her own which pt reports is baseline for 8 years   Stairs             Wheelchair Mobility    Modified Rankin (Stroke Patients Only)       Balance Overall balance assessment: Mild deficits observed, not formally tested                                           Cognition Arousal/Alertness: Awake/alert Behavior During Therapy: WFL for tasks assessed/performed Overall Cognitive Status: No family/caregiver present to determine baseline cognitive functioning                                 General Comments: decreased recall of precautions with pt able to state but not demonstrating understanding of education and repetitive with statements stating "I'm not allowed to do anything"      Exercises      General Comments        Pertinent Vitals/Pain Pain Assessment: (P) Faces    Home Living Family/patient expects to be discharged to:: (P) Private residence Living Arrangements: (P) Parent Available Help at Discharge: (P) Family;Available PRN/intermittently Type of Home: (P) Apartment Home Access: (P) Level entry   Home Layout: (P) One level Home Equipment: (P) None Additional Comments: (P) Mother is bed bound. 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    PT Goals (current goals can now be found in the care plan section) Progress towards PT goals: Goals met/education completed, patient discharged from  PT    Frequency           PT Plan Current plan remains appropriate    Co-evaluation              AM-PAC PT "6 Clicks" Mobility   Outcome Measure  Help needed turning from your back to your side while in a flat bed without using bedrails?: None Help needed moving from lying on your back to sitting on the side of a flat bed without using bedrails?: None Help needed moving to and from a bed to a chair (including a wheelchair)?: A Little Help needed standing up from a chair using your arms (e.g., wheelchair or bedside chair)?: None Help needed to walk in hospital room?: A Little Help needed climbing 3-5 steps with a railing? : A Little 6 Click Score: 21     End of Session   Activity Tolerance: Patient tolerated treatment well Patient left: in chair;with call bell/phone within reach Nurse Communication: Mobility status;Precautions PT Visit Diagnosis: Unsteadiness on feet (R26.81);Other abnormalities of gait and mobility (R26.89)     Time: 7354-3014 PT Time Calculation (min) (ACUTE ONLY): 12 min  Charges:  $Gait Training: 8-22 mins                     Redbird Smith, PT Acute Rehabilitation Services Pager: [phone] Office: [place] 02/06/2019, 9:32 AM

## 2019-02-06 NOTE — Plan of Care (Signed)
Patient alert and oriented, mae's well, voiding adequate amount of urine, swallowing without difficulty, no c/o pain at time of discharge. Patient discharged home with family. Script and discharged instructions given to patient. Patient and family stated understanding of instructions given. Patient has an appointment with Dr. Jones °

## 2019-02-06 NOTE — Evaluation (Signed)
Occupational Therapy Evaluation Patient Details Name: Christine Douglas MRN: [id number] DOB: [birth year] Today's Date: 02/06/2019    History of Present Illness Pt is a 58 y/o female s/p C7-T1 posterior cervical fusion. PMH includes PTSD and spinal surgery.    Clinical Impression   Patient evaluated by Occupational Therapy with no further acute OT needs identified. All education has been completed and the patient has no further questions. See below for any follow-up Occupational Therapy or equipment needs. OT to sign off. Thank you for referral.      Follow Up Recommendations  No OT follow up    Equipment Recommendations  None recommended by OT    Recommendations for Other Services       Precautions / Restrictions Precautions Precautions: Cervical Precaution Comments: hand out for cervical, fine motor and theraputty provided ( red putty)      Mobility Bed Mobility Overal bed mobility: Modified Independent                Transfers Overall transfer level: Modified independent                    Balance Overall balance assessment: Mild deficits observed, not formally tested                                         ADL either performed or assessed with clinical judgement   ADL Overall ADL's : Modified independent                                             Vision         Perception     Praxis      Pertinent Vitals/Pain Pain Assessment: Faces Faces Pain Scale: Hurts a little bit Pain Location: neck  Pain Descriptors / Indicators: Aching;Operative site guarding Pain Intervention(s): Monitored during session;Premedicated before session;Repositioned     Hand Dominance Left   Extremity/Trunk Assessment Upper Extremity Assessment Upper Extremity Assessment: LUE deficits/detail LUE Deficits / Details: reports sensation changes and complete numbness in pinky finger  LUE Sensation: decreased light touch LUE Coordination:  decreased fine motor;decreased gross motor   Lower Extremity Assessment Lower Extremity Assessment: Defer to PT evaluation   Cervical / Trunk Assessment Cervical / Trunk Assessment: Other exceptions Cervical / Trunk Exceptions: s/p cervical surgery    Communication Communication Communication: No difficulties   Cognition Arousal/Alertness: Awake/alert Behavior During Therapy: WFL for tasks assessed/performed Overall Cognitive Status: No family/caregiver present to determine baseline cognitive functioning                                 General Comments: 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 pt needs reminders at times for functional use of precautions.    General Comments  educated on theraputty red use with handout and return demo    Exercises     Shoulder Instructions      Home Living Family/patient expects to be discharged to:: Private residence Living Arrangements: Parent Available Help at Discharge: Family;Available PRN/intermittently Type of Home: Apartment Home Access: Level entry     Home Layout: One level     Bathroom Shower/Tub: Tub/shower unit   [employer]  Toilet: Standard     Home Equipment: None   Additional Comments: Mother is bed bound. Reports daughter can assist with caring for her mother   Cervical precautions ( handout provided): Educated patient onoral care using cups, washing face with cloth, never to wash directly on incision site, avoid neck rotation flexion and extension, positioning with pillows in chair for bil UE, sleeping positioning, avoiding pushing / pulling with bil UE, and fine motor exercises ( handout provided). 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         OT Problem List:        OT Treatment/Interventions:      OT Goals(Current goals can be found in the care plan section) Acute Rehab OT Goals Patient  Stated Goal: to return home and get use of L arm again  OT Frequency:     Barriers to D/C:            Co-evaluation              AM-PAC OT "6 Clicks" Daily Activity     Outcome Measure Help from another person eating meals?: None Help from another person taking care of personal grooming?: None Help from another person toileting, which includes using toliet, bedpan, or urinal?: None Help from another person bathing (including washing, rinsing, drying)?: None Help from another person to put on and taking off regular upper body clothing?: None Help from another person to put on and taking off regular lower body clothing?: None 6 Click Score: 24   End of Session Nurse Communication: Mobility status;Precautions  Activity Tolerance: Patient tolerated treatment well Patient left: in chair;with call bell/phone within reach  OT Visit Diagnosis: Unsteadiness on feet (R26.81)                Time: 8003-4917 OT Time Calculation (min): 33 min Charges:  OT General Charges $OT Visit: 1 Visit OT Evaluation $OT Eval Moderate Complexity: 1 Mod   Mateo Flow, OTR/L  Acute Rehabilitation Services Pager: [phone] Office: [phone] .   Mateo Flow 02/06/2019, 9:34 AM

## 2019-02-06 NOTE — Discharge Instructions (Signed)

## 2019-02-06 NOTE — Discharge Summary (Signed)
Physician Discharge Summary  Patient ID: Christine Douglas MRN: [id number] DOB/AGE: 1961/02/23 58 y.o.  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 She post op is ambulating, voiding, and tolerating a regular diet. Her wound is clean, dry, and without signs of infection.   Consults: None  Significant Diagnostic Studies: none  Treatments: surgery: 1.  Posterior cervical arthrodesis C7-T1 utilizing morselized autograft and allograft, 2.  Decompressive laminectomy medial facetectomy foraminotomies C7-T1 bilaterally, 3.  Nonsegmental fixation C7-T1 utilizing Alphatec lateral mass screws at C7 and pedicle screws at T1    Discharge Exam: Blood pressure (!) 104/59, pulse 89, temperature 97.7 F (36.5 C), temperature source Oral, resp. rate 20, height 5\' 4"  (1.626 m), weight 68.5 kg, SpO2 96 %. General appearance: alert, cooperative, appears stated age and mild distress Neck: supple, symmetrical, trachea midline and healing incision Moving all extremities well. Disposition: Discharge disposition: 01-Home or Self Care        Allergies as of 02/06/2019      Reactions   Anesthetics, Amide Anaphylaxis   Neck surgery pt unsure of exact anesthetic-afraid to have any further surgeries; Feels weird for a while   Flexeril [cyclobenzaprine] Swelling   Cannot take muscle relaxers -- swelling of legs and body       Medication List    STOP taking these medications   celecoxib 200 MG capsule Commonly known as: CELEBREX     TAKE these medications    buPROPion 150 MG 24 hr tablet Commonly known as: WELLBUTRIN XL Take 3 tablets (450 mg total) by mouth daily.   diazepam 10 MG tablet Commonly known as: VALIUM Take 1 tablet (10 mg total) by mouth every 12 (twelve) hours as needed for anxiety.   guaiFENesin 600 MG 12 hr tablet Commonly known as: MUCINEX Take 600 mg by mouth daily.   HYDROcodone-acetaminophen 5-325 MG tablet Commonly known as: NORCO/VICODIN Take 1 tablet by mouth every 6 (six) hours as needed for moderate pain (pain.).   hydrOXYzine 25 MG tablet Commonly known as: ATARAX/VISTARIL TAKE 1 TABLET BY MOUTH EVERYDAY AT BEDTIME   prazosin 1 MG capsule Commonly known as: MINIPRESS Take 3 capsules (3 mg total) by mouth at bedtime.      Follow-up Information    Christine Moore, MD Follow up in 2 week(s).   Specialty: Neurosurgery Why: keep your scheduled appointment Contact information: [address] [place] [address] [phone]           Signed: Winfield Cunas 02/06/2019, 7:23 AM

## 2019-02-11 ENCOUNTER — Encounter (HOSPITAL_COMMUNITY): Payer: Self-pay | Admitting: Neurological Surgery

## 2019-02-24 ENCOUNTER — Telehealth (HOSPITAL_COMMUNITY): Payer: Self-pay

## 2019-02-24 NOTE — Telephone Encounter (Signed)
Patient called and would like to know if you can increase her Valium for a month. Patient just had a cervical fusion and can not take muscle relaxers, and Dr. Ronnald Ramp will not prescribe pain medication because she takes the Valium. He told her to talk to you to get it increased for a short time.

## 2019-02-25 NOTE — Telephone Encounter (Signed)
I am not comfortable increasing the dose. She will need to speak with her doctor about other pain med options

## 2019-03-17 ENCOUNTER — Telehealth (HOSPITAL_COMMUNITY): Payer: Self-pay

## 2019-03-17 NOTE — Telephone Encounter (Signed)
Pt has had a couple surgeries since last visit and cannot take muscle relax res due to allergic reaction. She is currently taking diazepam bid and would like to know if you would increase to tid times two months until she can get off pain med. Please review and advise.

## 2019-03-18 DIAGNOSIS — M5412 Radiculopathy, cervical region: Secondary | ICD-10-CM | POA: Diagnosis not present

## 2019-03-18 NOTE — Telephone Encounter (Signed)
No I will not increase the dose

## 2019-03-22 NOTE — Telephone Encounter (Signed)
I called patient and let her know, she is going to call her surgeon and see if there is anything he can do.

## 2019-04-08 ENCOUNTER — Ambulatory Visit (INDEPENDENT_AMBULATORY_CARE_PROVIDER_SITE_OTHER): Payer: BC Managed Care – PPO | Admitting: Psychiatry

## 2019-04-08 ENCOUNTER — Encounter (HOSPITAL_COMMUNITY): Payer: Self-pay | Admitting: Psychiatry

## 2019-04-08 ENCOUNTER — Other Ambulatory Visit: Payer: Self-pay

## 2019-04-08 DIAGNOSIS — F330 Major depressive disorder, recurrent, mild: Secondary | ICD-10-CM

## 2019-04-08 DIAGNOSIS — F4310 Post-traumatic stress disorder, unspecified: Secondary | ICD-10-CM

## 2019-04-08 DIAGNOSIS — F900 Attention-deficit hyperactivity disorder, predominantly inattentive type: Secondary | ICD-10-CM | POA: Diagnosis not present

## 2019-04-08 DIAGNOSIS — F99 Mental disorder, not otherwise specified: Secondary | ICD-10-CM

## 2019-04-08 DIAGNOSIS — F5105 Insomnia due to other mental disorder: Secondary | ICD-10-CM | POA: Diagnosis not present

## 2019-04-08 MED ORDER — DIAZEPAM 10 MG PO TABS: 10.0000 mg | ORAL_TABLET | Freq: Two times a day (BID) | ORAL | 2 refills | Status: DC | PRN

## 2019-04-08 MED ORDER — HYDROXYZINE HCL 25 MG PO TABS: ORAL_TABLET | ORAL | 0 refills | Status: DC

## 2019-04-08 MED ORDER — BUPROPION HCL ER (XL) 150 MG PO TB24: 450.0000 mg | ORAL_TABLET | Freq: Every day | ORAL | 0 refills | Status: DC

## 2019-04-08 NOTE — Progress Notes (Signed)
  Virtual Visit via Telephone Note  I connected with Christine Douglas  on 04/08/19 at  1:00 PM EST by telephone and verified that I am speaking with the correct person using two identifiers.  Location: Patient: home Provider: office   I discussed the limitations, risks, security and privacy concerns of performing an evaluation and management service by telephone and the availability of in person appointments. I also discussed with the patient that there may be a patient responsible charge related to this service. The patient expressed understanding and agreed to proceed.   History of Present Illness: Pt states she is feeling better. Her pain is now mostly in her legs. She was given Neurontin and is sleeping well at night. She can take it 3 times a day but it makes her very tired so she tries to avoid doing that. She is not going out most days to avoid COVID. "I'm ok right now. Considering all that is gone- COVID, health and mom I think I am doing fantastic". Her daughter moved back in about 1 month ago. Her daughter is helping take care of her and her mom. Pt has good and bad days. Bad days happen about once a week. someone will point it out to her and she will take a nap and feel better. It helps that her daughter is better. Pt is doing home remodeling and that triggered her PTSD due to having so many people in the house. HV is up. She copes by using breathing techniques. She denies any manic or hypomanic like symptoms. [place]onna denies SI/HI and hopelessness. Her meds are working and she does not want them changed at this time.    Observations/Objective: I spoke with Christine Douglas on the phone.  Pt was calm, pleasant and cooperative.  Pt was engaged in the conversation and answered questions appropriately.  Speech was clear and coherent with normal rate, tone and volume.  Mood is euthymic, affect is full. Thought processes are coherent and circumstantial.  Thought content is logical.  Pt denies SI/HI.   Pt  denies auditory and visual hallucinations and did not appear to be responding to internal stimuli.  Memory and concentration are good.  Fund of knowledge and use of language are average.  Insight and judgment are fair.  I am unable to comment on psychomotor activity, general appearance, hygiene, or eye contact as I was unable to physically see the patient on the phone.  Vital signs not available since interview conducted virtually.     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 nightly for sleep  Wellbutrin XL 450 mg p.o. daily for mood and ADHD  Valium 10 mg p.o. twice daily as needed anxiety    Follow Up Instructions: In 8 weeks or sooner if needed   I discussed the assessment and treatment plan with the patient. The patient was provided an opportunity to ask questions and all were answered. The patient agreed with the plan and demonstrated an understanding of the instructions.   The patient was advised to call back or seek an in-person evaluation if the symptoms worsen or if the condition fails to improve as anticipated.  I provided 20 minutes of non-face-to-face time during this encounter.   Christine Cradle, MD

## 2019-05-07 HISTORY — PX: AUGMENTATION MAMMAPLASTY: SUR837

## 2019-06-10 ENCOUNTER — Telehealth (HOSPITAL_COMMUNITY): Payer: Self-pay | Admitting: Psychiatry

## 2019-06-10 ENCOUNTER — Ambulatory Visit (HOSPITAL_COMMUNITY): Payer: BC Managed Care – PPO | Admitting: Psychiatry

## 2019-06-10 ENCOUNTER — Other Ambulatory Visit: Payer: Self-pay

## 2019-06-10 NOTE — Telephone Encounter (Signed)
I called the patient on the number listed in the chart several times at our scheduled appointment time. There was no answer. I was unable to leave a voice message.  I was unable to speak with the patient today.

## 2019-06-28 ENCOUNTER — Telehealth (HOSPITAL_COMMUNITY): Payer: Self-pay | Admitting: *Deleted

## 2019-06-28 NOTE — Telephone Encounter (Signed)
Pt called requesting refill on the Diazepam 10mg  and she has changed pharmacy to [facility] in [place] on [address]  She has an upcoming appointment on 07/01/19. Please review.

## 2019-07-01 ENCOUNTER — Other Ambulatory Visit: Payer: Self-pay

## 2019-07-01 ENCOUNTER — Ambulatory Visit (INDEPENDENT_AMBULATORY_CARE_PROVIDER_SITE_OTHER): Payer: Self-pay | Admitting: Psychiatry

## 2019-07-01 DIAGNOSIS — F4310 Post-traumatic stress disorder, unspecified: Secondary | ICD-10-CM

## 2019-07-01 DIAGNOSIS — F900 Attention-deficit hyperactivity disorder, predominantly inattentive type: Secondary | ICD-10-CM

## 2019-07-01 DIAGNOSIS — F5105 Insomnia due to other mental disorder: Secondary | ICD-10-CM

## 2019-07-01 DIAGNOSIS — F330 Major depressive disorder, recurrent, mild: Secondary | ICD-10-CM

## 2019-07-01 DIAGNOSIS — F99 Mental disorder, not otherwise specified: Secondary | ICD-10-CM

## 2019-07-01 MED ORDER — BUPROPION HCL ER (XL) 150 MG PO TB24: 450.0000 mg | ORAL_TABLET | Freq: Every day | ORAL | 0 refills | Status: DC

## 2019-07-01 MED ORDER — DIAZEPAM 10 MG PO TABS: 10.0000 mg | ORAL_TABLET | Freq: Two times a day (BID) | ORAL | 0 refills | Status: DC | PRN

## 2019-07-01 MED ORDER — HYDROXYZINE HCL 25 MG PO TABS: ORAL_TABLET | ORAL | 0 refills | Status: DC
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
## 2019-07-22 ENCOUNTER — Ambulatory Visit (INDEPENDENT_AMBULATORY_CARE_PROVIDER_SITE_OTHER): Payer: Medicaid Other | Admitting: Psychiatry

## 2019-07-22 ENCOUNTER — Encounter (HOSPITAL_COMMUNITY): Payer: Self-pay | Admitting: Psychiatry

## 2019-07-22 ENCOUNTER — Other Ambulatory Visit: Payer: Self-pay

## 2019-07-22 DIAGNOSIS — F900 Attention-deficit hyperactivity disorder, predominantly inattentive type: Secondary | ICD-10-CM

## 2019-07-22 DIAGNOSIS — F99 Mental disorder, not otherwise specified: Secondary | ICD-10-CM

## 2019-07-22 DIAGNOSIS — F5105 Insomnia due to other mental disorder: Secondary | ICD-10-CM

## 2019-07-22 DIAGNOSIS — F330 Major depressive disorder, recurrent, mild: Secondary | ICD-10-CM

## 2019-07-22 DIAGNOSIS — F4310 Post-traumatic stress disorder, unspecified: Secondary | ICD-10-CM

## 2019-07-22 MED ORDER — HYDROXYZINE HCL 25 MG PO TABS: ORAL_TABLET | ORAL | 0 refills | Status: DC

## 2019-07-22 MED ORDER — LURASIDONE HCL 20 MG PO TABS: 20.0000 mg | ORAL_TABLET | Freq: Every day | ORAL | 1 refills | Status: DC

## 2019-07-22 MED ORDER — BUPROPION HCL ER (XL) 150 MG PO TB24: 450.0000 mg | ORAL_TABLET | Freq: Every day | ORAL | 0 refills | Status: DC

## 2019-07-22 MED ORDER — DIAZEPAM 10 MG PO TABS: 10.0000 mg | ORAL_TABLET | Freq: Two times a day (BID) | ORAL | 1 refills | Status: DC | PRN

## 2019-07-22 NOTE — Progress Notes (Signed)
Virtual Visit via Telephone Note  I connected with Christine Douglas on 07/22/19 at  9:30 AM EDT by telephone and verified that I am speaking with the correct person using two identifiers.  Location: Patient: home Provider: office   I discussed the limitations, risks, security and privacy concerns of performing an evaluation and management service by telephone and the availability of in person appointments. I also discussed with the patient that there may be a patient responsible charge related to this service. The patient expressed understanding and agreed to proceed.   History of Present Illness: 5he is having 2  good and about 5 bad days a week. She is easily frustrated. She is procrastinating and has low motivation to do alot of things. She is depressed and spends a lot of time lying around. Christine Douglas is endorsing anhedonia. She denies SI/HI. Less denies manic and hypomanic.  Her anxiety is unchanged. She has racing thoughts and can not calm herself down. The increase in Vistaril did help improve her sleep. Energy is low. Her PTSD is unchanged and has been triggered by her daughter. HV is high. Christine Douglas is lonely. She is frustrated about being the sole caretaker of her mother. She plans to place her in a NH in 1 year.      Observations/Objective:  General Appearance: unable to assess  Eye Contact:  unable to assess  Speech:  Clear and Coherent and Normal Rate  Volume:  Normal  Mood:  Anxious and Depressed  Affect:  Congruent  Thought Process:  Coherent and Descriptions of Associations: Circumstantial  Orientation:  Full (Time, Place, and Person)  Thought Content:  Logical  Suicidal Thoughts:  No  Homicidal Thoughts:  No  Memory:  Immediate;   Good  Judgement:  Good  Insight:  Good  Psychomotor Activity: unable to assess  Concentration:  Concentration: Good  Recall:  Good  Fund of Knowledge:  Good  Language:  Good  Akathisia:  unable to assess  Handed:  Right  AIMS (if indicated):      Assets:  Communication Skills Desire for Improvement Financial Resources/Insurance Housing Transportation  ADL's:  unable to assess  Cognition:  WNL  Sleep:        I reviewed the information below on 07/22/19 and have updated it Assessment and Plan:MDD versus bipolar 2 disorder; PTSD; ADHD-inattentive type; insomnia; rule out benzodiazepines abuse; rule out borderline personality disorder   Status of current symptoms: worsening depression   Vistaril 25 mg p.o. nightly for sleep   Wellbutrin XL 450 mg p.o. daily for mood and ADHD   Valium 10 mg p.o. twice daily as needed anxiety  Start Latuda 20mg  po qD   Follow Up Instructions: In 1-2 months or sooner if needed   I discussed the assessment and treatment plan with the patient. The patient was provided an opportunity to ask questions and all were answered. The patient agreed with the plan and demonstrated an understanding of the instructions.   The patient was advised to call back or seek an in-person evaluation if the symptoms worsen or if the condition fails to improve as anticipated.  I provided 25 minutes of non-face-to-face time during this encounter.   , MD

## 2019-08-03 ENCOUNTER — Telehealth: Payer: Self-pay

## 2019-08-03 NOTE — Telephone Encounter (Signed)
[employer] COVERAGE DENIED AS OF 07/29/2019  LATUDA 20MG  TABLET

## 2019-08-05 NOTE — Telephone Encounter (Signed)
Patient LVM stating that she spoke with her insurance company and they stated that if she has a diagnosis of Bipolar Manic Depression, her Latuda 20mg  will get approved. That diagnosis was not originally used because there's no reflection of that diagnosis in patient's last office visit notes. Can that be used to appeal her PA? Please review and advise. Thank you.

## 2019-08-19 ENCOUNTER — Other Ambulatory Visit: Payer: Self-pay

## 2019-08-19 ENCOUNTER — Ambulatory Visit (HOSPITAL_COMMUNITY): Payer: Medicaid Other | Admitting: Psychiatry

## 2019-08-19 DIAGNOSIS — F330 Major depressive disorder, recurrent, mild: Secondary | ICD-10-CM

## 2019-08-19 DIAGNOSIS — F4310 Post-traumatic stress disorder, unspecified: Secondary | ICD-10-CM

## 2019-08-19 DIAGNOSIS — F5105 Insomnia due to other mental disorder: Secondary | ICD-10-CM

## 2019-08-19 DIAGNOSIS — F900 Attention-deficit hyperactivity disorder, predominantly inattentive type: Secondary | ICD-10-CM

## 2019-08-19 MED ORDER — DIAZEPAM 10 MG PO TABS: 10.0000 mg | ORAL_TABLET | Freq: Two times a day (BID) | ORAL | 1 refills | Status: DC | PRN

## 2019-08-19 MED ORDER — LURASIDONE HCL 20 MG PO TABS: 20.0000 mg | ORAL_TABLET | Freq: Every day | ORAL | 1 refills | Status: DC

## 2019-08-19 MED ORDER — HYDROXYZINE HCL 25 MG PO TABS: ORAL_TABLET | ORAL | 0 refills | Status: DC

## 2019-08-19 MED ORDER — BUPROPION HCL ER (XL) 150 MG PO TB24: 450.0000 mg | ORAL_TABLET | Freq: Every day | ORAL | 0 refills | Status: DC

## 2019-08-19 NOTE — Progress Notes (Unsigned)
A little better because of weather. She is still feeling down  Insurance denied latuda- wants to try  She is trying to figure out if she is depressed or it is more issues with focus. She is unmotivated, forgetting things, behind on bills. It was not like this before. She used to be active. Doesn&#39;t feel like deep depression. Denies SI/HI. Denies hypomanic and manic like symptoms.wellbutrin works but needs a booster to make it more effective.   Was tested and diagnosed with ADHD.   PTSD - HV is high. She has not gone to the grocery store in 2 months  Start Latuda- for bipolar d/o  No adderall, ritalin because ineffective Liked Vyvanse  6 weeks, put in orders

## 2019-08-24 ENCOUNTER — Telehealth (HOSPITAL_COMMUNITY): Payer: Self-pay

## 2019-08-24 NOTE — Telephone Encounter (Signed)
Received verbal order to file appeal prior authorization for Latuda and to include patient's diagnosis of Bipolar type 2, depressed episode.

## 2019-08-26 NOTE — Telephone Encounter (Signed)
Received fax from [employer]. Prior authorization for Latuda approved 4/22/204 through 08/25/2020.

## 2019-09-17 ENCOUNTER — Other Ambulatory Visit: Payer: Self-pay

## 2019-09-17 ENCOUNTER — Ambulatory Visit (INDEPENDENT_AMBULATORY_CARE_PROVIDER_SITE_OTHER): Payer: Self-pay | Admitting: Adult Health

## 2019-09-17 ENCOUNTER — Encounter: Payer: Self-pay | Admitting: Adult Health

## 2019-09-17 VITALS — BP 121/85 | HR 93 | Ht 64.0 in | Wt 151.0 lb

## 2019-09-17 DIAGNOSIS — N390 Urinary tract infection, site not specified: Secondary | ICD-10-CM

## 2019-09-17 DIAGNOSIS — R319 Hematuria, unspecified: Secondary | ICD-10-CM

## 2019-09-17 DIAGNOSIS — Z113 Encounter for screening for infections with a predominantly sexual mode of transmission: Secondary | ICD-10-CM

## 2019-09-17 DIAGNOSIS — Z7189 Other specified counseling: Secondary | ICD-10-CM | POA: Insufficient documentation

## 2019-09-17 LAB — POCT URINALYSIS DIPSTICK
Glucose, UA: NEGATIVE
Ketones, UA: NEGATIVE
Nitrite, UA: POSITIVE
Protein, UA: NEGATIVE

## 2019-09-17 MED ORDER — SULFAMETHOXAZOLE-TRIMETHOPRIM 800-160 MG PO TABS: 1.0000 | ORAL_TABLET | Freq: Two times a day (BID) | ORAL | 0 refills | Status: DC

## 2019-09-17 MED ORDER — ESTRADIOL 0.1 MG/24HR TD PTTW: 1.0000 | MEDICATED_PATCH | TRANSDERMAL | 12 refills | Status: DC

## 2019-09-17 NOTE — Progress Notes (Signed)
  Subjective:     Patient ID: Christine Douglas, female   DOB: 1960-10-20, 59 y.o.   MRN: [id number]  HPI Christine Douglas is a 59 year old white female, divorced, sp hysterectomy BSO, in complaining of pressure and pain with urination, and wants to get on estrogen therapy. PCP is Dr Parke Simmers.  Review of Systems +pain and pressure with urination +moody and anxiety +weight gain Decreased libido Reviewed past medical,surgical, social and family history. Reviewed medications and allergies.     Objective:   Physical Exam BP 121/85 (BP Location: Left Arm, Patient Position: Sitting, Cuff Size: Normal)   Pulse 93   Ht 5\' 4"  (1.626 m)   Wt 151 lb (68.5 kg)   BMI 25.92 kg/m   Urine dipstick 2+leuks,+nitrates, trace blood. Fall risk is high.  Skin warm and dry.Pelvic: external genitalia is normal in appearance no lesions, vagina: pale with loss of moisture and rugae,urethra has no lesions or masses noted, cervix and uterus are absent, adnexa: no masses or tenderness noted. Bladder is non tender and no masses felt. Nuswab obtained.   Examination chaperoned by LPN.  Assessment:    1. Hematuria, unspecified type Push fluids   2. Urinary tract infection with hematuria, site unspecified Will rx septra ds Meds ordered this encounter  Medications  . sulfamethoxazole-trimethoprim (BACTRIM DS) 800-160 MG tablet    Sig: Take 1 tablet by mouth 2 (two) times daily. Take 1 bid    Dispense:  14 tablet    Refill:  0    Order Specific Question:   Supervising Provider    Answer:   Malachy Mood, LUTHER H [[id number]]  . estradiol (VIVELLE-DOT) 0.1 MG/24HR patch    Sig: Place 1 patch (0.1 mg total) onto the skin 2 (two) times a week.    Dispense:  8 patch    Refill:  12    Order Specific Question:   Supervising Provider    Answer:   Despina Hidden H [[id number]]   UA C&S sent  3. Screening examination for STD (sexually transmitted disease) Nuswab sent   4. Counseling for estrogen replacement therapy Pt aware of risks and  benefits and she wants to try the patch     Plan:     Follow up in 8 weeks for ROS

## 2019-09-18 LAB — URINALYSIS, ROUTINE W REFLEX MICROSCOPIC
Bilirubin, UA: NEGATIVE
Glucose, UA: NEGATIVE
Ketones, UA: NEGATIVE
Nitrite, UA: POSITIVE — AB
Specific Gravity, UA: 1.026 (ref 1.005–1.030)
Urobilinogen, Ur: 0.2 mg/dL (ref 0.2–1.0)
pH, UA: 5.5 (ref 5.0–7.5)

## 2019-09-18 LAB — MICROSCOPIC EXAMINATION
Bacteria, UA: NONE SEEN
Casts: NONE SEEN /lpf
WBC, UA: >30 /hpf — AB (ref 0–5)

## 2019-09-21 LAB — NUSWAB VAGINITIS PLUS (VG+)
Atopobium vaginae: HIGH Score — AB
Candida albicans, NAA: NEGATIVE
Candida glabrata, NAA: NEGATIVE
Chlamydia trachomatis, NAA: NEGATIVE
Neisseria gonorrhoeae, NAA: NEGATIVE
Trich vag by NAA: NEGATIVE

## 2019-09-21 LAB — URINE CULTURE

## 2019-09-23 ENCOUNTER — Telehealth: Payer: Self-pay | Admitting: Adult Health

## 2019-09-23 MED ORDER — METRONIDAZOLE 500 MG PO TABS: 500.0000 mg | ORAL_TABLET | Freq: Two times a day (BID) | ORAL | 0 refills | Status: DC

## 2019-09-23 NOTE — Telephone Encounter (Signed)
Pt aware +urine culture finish septra ds and will rx flagyl

## 2019-09-30 ENCOUNTER — Encounter (HOSPITAL_COMMUNITY): Payer: Self-pay | Admitting: Psychiatry

## 2019-09-30 ENCOUNTER — Other Ambulatory Visit: Payer: Self-pay

## 2019-09-30 ENCOUNTER — Telehealth (INDEPENDENT_AMBULATORY_CARE_PROVIDER_SITE_OTHER): Payer: Self-pay | Admitting: Psychiatry

## 2019-09-30 DIAGNOSIS — F900 Attention-deficit hyperactivity disorder, predominantly inattentive type: Secondary | ICD-10-CM

## 2019-09-30 DIAGNOSIS — F330 Major depressive disorder, recurrent, mild: Secondary | ICD-10-CM

## 2019-09-30 DIAGNOSIS — F4310 Post-traumatic stress disorder, unspecified: Secondary | ICD-10-CM

## 2019-09-30 DIAGNOSIS — F5105 Insomnia due to other mental disorder: Secondary | ICD-10-CM

## 2019-09-30 DIAGNOSIS — F99 Mental disorder, not otherwise specified: Secondary | ICD-10-CM

## 2019-09-30 MED ORDER — BUPROPION HCL ER (XL) 150 MG PO TB24: 450.0000 mg | ORAL_TABLET | Freq: Every day | ORAL | 0 refills | Status: DC

## 2019-09-30 MED ORDER — DIAZEPAM 10 MG PO TABS: 10.0000 mg | ORAL_TABLET | Freq: Two times a day (BID) | ORAL | 2 refills | Status: DC | PRN

## 2019-09-30 MED ORDER — LURASIDONE HCL 20 MG PO TABS: 20.0000 mg | ORAL_TABLET | Freq: Every day | ORAL | 0 refills | Status: DC

## 2019-09-30 NOTE — Progress Notes (Signed)
Virtual Visit via Telephone Note  I connected with [name] on 09/30/19 at  2:30 PM EDT by telephone and verified that I am speaking with the correct person using two identifiers.  Location: Patient: home Provider: office   I discussed the limitations, risks, security and privacy concerns of performing an evaluation and management service by telephone and the availability of in person appointments. I also discussed with the patient that there may be a patient responsible charge related to this service. The patient expressed understanding and agreed to proceed.   History of Present Illness: Chakita shares that the [place] is helping. She is more active and motivated. She is going out more when she can tolerate the pollen. Lauralyn has started some projects around the house. Her depression has improved. She is feeling better. She denies SI/HI. Echo denies manic and hypomanic like symptoms. Her relationship with her mom has improved. Dinia's HV remains high but she has gone out to eat a few times.    Observations/Objective:  General Appearance: unable to assess  Eye Contact:  unable to assess  Speech:  Clear and Coherent and Normal Rate  Volume:  Normal  Mood:  Depressed  Affect:  Full Range  Thought Process:  Coherent and Descriptions of Associations: Circumstantial  Orientation:  Full (Time, Place, and Person)  Thought Content:  Logical  Suicidal Thoughts:  No  Homicidal Thoughts:  No  Memory:  Immediate;   Good  Judgement:  Fair  Insight:  Fair  Psychomotor Activity: unable to assess  Concentration:  Concentration: Poor  Recall:  Poor  Fund of Knowledge:  Good  Language:  Good  Akathisia:  unable to assess  Handed:  Right  AIMS (if indicated):     Assets:  Communication Skills Desire for Improvement Financial Resources/Insurance Housing Resilience Social Support Talents/Skills Transportation  ADL's:  unable to assess  Cognition:  WNL  Sleep:        I reviewed the  information below on 09/30/19 and have updated it. Assessment and Plan:  MDD versus bipolar 2 disorder; PTSD; ADHD-inattentive type; insomnia; rule out benzodiazepines abuse; rule out borderline personality disorder   Status of current symptoms: improved depression    Wellbutrin XL 450 mg p.o. daily for mood and ADHD   Valium 10 mg p.o. twice daily as needed anxiety   Latuda 20mg  po qD    Follow Up Instructions: In 2-3 months or sooner if needed   I discussed the assessment and treatment plan with the patient. The patient was provided an opportunity to ask questions and all were answered. The patient agreed with the plan and demonstrated an understanding of the instructions.   The patient was advised to call back or seek an in-person evaluation if the symptoms worsen or if the condition fails to improve as anticipated.  I provided 15 minutes of non-face-to-face time during this encounter.   , MD

## 2019-11-12 ENCOUNTER — Ambulatory Visit: Payer: Self-pay | Admitting: Adult Health

## 2019-12-02 ENCOUNTER — Other Ambulatory Visit (HOSPITAL_COMMUNITY): Payer: Self-pay | Admitting: Internal Medicine

## 2019-12-02 DIAGNOSIS — M810 Age-related osteoporosis without current pathological fracture: Secondary | ICD-10-CM

## 2019-12-02 DIAGNOSIS — Z1231 Encounter for screening mammogram for malignant neoplasm of breast: Secondary | ICD-10-CM

## 2019-12-16 ENCOUNTER — Ambulatory Visit (HOSPITAL_COMMUNITY): Payer: Medicaid Other

## 2019-12-20 ENCOUNTER — Inpatient Hospital Stay (HOSPITAL_COMMUNITY): Admission: RE | Admit: 2019-12-20 | Payer: Medicaid Other | Source: Ambulatory Visit

## 2019-12-23 ENCOUNTER — Telehealth (INDEPENDENT_AMBULATORY_CARE_PROVIDER_SITE_OTHER): Payer: Self-pay | Admitting: Psychiatry

## 2019-12-23 ENCOUNTER — Other Ambulatory Visit: Payer: Self-pay

## 2019-12-23 DIAGNOSIS — F99 Mental disorder, not otherwise specified: Secondary | ICD-10-CM

## 2019-12-23 DIAGNOSIS — F5105 Insomnia due to other mental disorder: Secondary | ICD-10-CM

## 2019-12-23 DIAGNOSIS — F900 Attention-deficit hyperactivity disorder, predominantly inattentive type: Secondary | ICD-10-CM

## 2019-12-23 DIAGNOSIS — F330 Major depressive disorder, recurrent, mild: Secondary | ICD-10-CM

## 2019-12-23 DIAGNOSIS — F4310 Post-traumatic stress disorder, unspecified: Secondary | ICD-10-CM

## 2019-12-25 MED ORDER — DIAZEPAM 10 MG PO TABS: 10.0000 mg | ORAL_TABLET | Freq: Two times a day (BID) | ORAL | 2 refills | Status: DC | PRN

## 2019-12-25 MED ORDER — LURASIDONE HCL 20 MG PO TABS: 20.0000 mg | ORAL_TABLET | Freq: Every day | ORAL | 0 refills | Status: DC

## 2019-12-25 MED ORDER — BUPROPION HCL ER (XL) 150 MG PO TB24: 450.0000 mg | ORAL_TABLET | Freq: Every day | ORAL | 0 refills | Status: DC
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
## 2020-02-17 ENCOUNTER — Other Ambulatory Visit: Payer: Self-pay

## 2020-02-17 ENCOUNTER — Telehealth (INDEPENDENT_AMBULATORY_CARE_PROVIDER_SITE_OTHER): Payer: Self-pay | Admitting: Psychiatry

## 2020-02-17 DIAGNOSIS — F330 Major depressive disorder, recurrent, mild: Secondary | ICD-10-CM

## 2020-02-17 DIAGNOSIS — F4310 Post-traumatic stress disorder, unspecified: Secondary | ICD-10-CM

## 2020-02-17 DIAGNOSIS — F5105 Insomnia due to other mental disorder: Secondary | ICD-10-CM

## 2020-02-17 DIAGNOSIS — F99 Mental disorder, not otherwise specified: Secondary | ICD-10-CM

## 2020-02-17 DIAGNOSIS — F900 Attention-deficit hyperactivity disorder, predominantly inattentive type: Secondary | ICD-10-CM

## 2020-02-17 MED ORDER — DIAZEPAM 10 MG PO TABS: 10.0000 mg | ORAL_TABLET | Freq: Two times a day (BID) | ORAL | 2 refills | Status: DC | PRN

## 2020-02-17 MED ORDER — BUPROPION HCL ER (XL) 150 MG PO TB24: 450.0000 mg | ORAL_TABLET | Freq: Every day | ORAL | 0 refills | Status: DC

## 2020-02-17 NOTE — Progress Notes (Signed)
Virtual Visit via Telephone Note  I connected with Christine Douglas on 02/17/20 at  9:30 AM EDT by telephone and verified that I am speaking with the correct person using two identifiers.  Location: Patient: home Provider: office   I discussed the limitations, risks, security and privacy concerns of performing an evaluation and management service by telephone and the availability of in person appointments. I also discussed with the patient that there may be a patient responsible charge related to this service. The patient expressed understanding and agreed to proceed.   History of Present Illness: "I am ok. I am better this time than I was last time". Bay shares that she is getting by. Her mom was sick with COVID for about 8 days. It was tiring. Philana plans to put her mom in a home soon. Sona's daughter has moved in and it helps to have someone else there.  Aliscia did not pick up the Latuda because she was off of it for a while. Now that she has been off of it for a while and thinks it made her act differently.  She is starting medicare on Nov 1st. Shyanne is not happy about it. Her depression is overall unchanged. She continues to have low energy, low motivation and poor concentration. Diamone spends a lot of time lying around in her home. Since her hernia repair she hasn't been able to lift anything. As a result she can't do much and it just contributes to her depression. Genasis has trouble calming her mind at bedtime unless she takes Valium 15mg . It is partly due to caring for her mom, her own health and other stressors. The biggest problem is that she doesn't turn off the tv.  With Valium she is able to fall asleep quickly and gets about 8 hrs.  Jenna avoids going out shopping and especially in large crowds. COVID prevents her from going out due to anxiety of getting sick. Pt denies recent manic and hypomanic symptoms including periods of decreased need for sleep, increased energy, mood lability, impulsivity, FOI,  and excessive spending. Thomasine has gained 25 lbs since her hysterectomy. She denies SI/HI. Eldoris is taking the Valium in the evening and bedtime.     Observations/Objective:  General Appearance: unable to assess  Eye Contact:  unable to assess  Speech:  Clear and Coherent and Normal Rate  Volume:  Normal  Mood:  Anxious and Depressed  Affect:  Congruent  Thought Process:  Coherent and Descriptions of Associations: Circumstantial  Orientation:  Full (Time, Place, and Person)  Thought Content:  Logical  Suicidal Thoughts:  No  Homicidal Thoughts:  No  Memory:  Immediate;   Good  Judgement:  Good  Insight:  Good  Psychomotor Activity: unable to assess  Concentration:  Concentration: Good  Recall:  Good  Fund of Knowledge:  Good  Language:  Good  Akathisia:  unable to assess  Handed:  Right  AIMS (if indicated):     Assets:  Communication Skills Desire for Improvement Financial Resources/Insurance Housing Social Support Transportation Vocational/Educational  ADL's:  unable to assess  Cognition:  WNL  Sleep:         Assessment and Plan:  MDD vs Bipolar 2 disorder; PTSD; ADHD- inattentive type; Insomnia; r/o benzo abuse; r/o borderline PD  Wellbutrin XL 450mg  po qD  Valium 10mg  po BID  D/c Latuda Sahar does not want her meds changed at this time.   Follow Up Instructions: In 2-3 months or sooner if needed  I discussed the assessment and treatment plan with the patient. The patient was provided an opportunity to ask questions and all were answered. The patient agreed with the plan and demonstrated an understanding of the instructions.   The patient was advised to call back or seek an in-person evaluation if the symptoms worsen or if the condition fails to improve as anticipated.  I provided 20 minutes of non-face-to-face time during this encounter.   Charlcie Cradle, MD

## 2020-03-14 DIAGNOSIS — N760 Acute vaginitis: Secondary | ICD-10-CM | POA: Diagnosis not present

## 2020-03-14 DIAGNOSIS — Z6827 Body mass index (BMI) 27.0-27.9, adult: Secondary | ICD-10-CM | POA: Diagnosis not present

## 2020-03-19 ENCOUNTER — Encounter (HOSPITAL_COMMUNITY): Payer: Self-pay

## 2020-03-19 ENCOUNTER — Other Ambulatory Visit: Payer: Self-pay

## 2020-03-19 ENCOUNTER — Emergency Department (HOSPITAL_COMMUNITY)
Admission: EM | Admit: 2020-03-19 | Discharge: 2020-03-19 | Disposition: A | Discharge status: Home / Self Care | Payer: Medicare HMO | Attending: Emergency Medicine | Admitting: Emergency Medicine

## 2020-03-19 ENCOUNTER — Emergency Department (HOSPITAL_COMMUNITY): Payer: Medicare HMO

## 2020-03-19 DIAGNOSIS — R000 Tachycardia, unspecified: Secondary | ICD-10-CM | POA: Diagnosis not present

## 2020-03-19 DIAGNOSIS — S4991XA Unspecified injury of right shoulder and upper arm, initial encounter: Secondary | ICD-10-CM

## 2020-03-19 DIAGNOSIS — W19XXXA Unspecified fall, initial encounter: Secondary | ICD-10-CM

## 2020-03-19 DIAGNOSIS — R609 Edema, unspecified: Secondary | ICD-10-CM | POA: Diagnosis not present

## 2020-03-19 DIAGNOSIS — W01198A Fall on same level from slipping, tripping and stumbling with subsequent striking against other object, initial encounter: Secondary | ICD-10-CM | POA: Insufficient documentation

## 2020-03-19 DIAGNOSIS — M25511 Pain in right shoulder: Secondary | ICD-10-CM | POA: Diagnosis not present

## 2020-03-19 DIAGNOSIS — Y92512 Supermarket, store or market as the place of occurrence of the external cause: Secondary | ICD-10-CM | POA: Insufficient documentation

## 2020-03-19 DIAGNOSIS — R52 Pain, unspecified: Secondary | ICD-10-CM | POA: Diagnosis not present

## 2020-03-19 NOTE — Discharge Instructions (Signed)
You have been seen here for right shoulder pain. I recommend taking over-the-counter pain medications like ibuprofen and/or Tylenol every 6 as needed.  Please follow dosage and on the back of bottle.  I also recommend applying heat to the area and stretching out the muscles as this will help decrease stiffness and pain.  I have given you information on exercises please follow.  I recommend follow-up with orthopedics for further evaluation management  Come back to the emergency department if you develop chest pain, shortness of breath, severe abdominal pain, uncontrolled nausea, vomiting, diarrhea.

## 2020-03-19 NOTE — ED Notes (Signed)
This RN to bedside for discharge and sling placement. On arrival to room, this RN notes room to be empty of patient and patient belongings. This RN searched surrounding areas with no sight of patient or belongings.

## 2020-03-19 NOTE — ED Triage Notes (Signed)
Pt to er via ems, per ems pt fell at [employer] and hurt her R arm.  Pt moving her arm and talking on the phone.  Pt states the pain is up in her shoulder.
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
## 2020-03-29 DIAGNOSIS — M25511 Pain in right shoulder: Secondary | ICD-10-CM | POA: Diagnosis not present

## 2020-03-29 DIAGNOSIS — Z6827 Body mass index (BMI) 27.0-27.9, adult: Secondary | ICD-10-CM | POA: Diagnosis not present

## 2020-04-05 ENCOUNTER — Telehealth: Payer: Self-pay | Admitting: Adult Health

## 2020-04-05 DIAGNOSIS — M25511 Pain in right shoulder: Secondary | ICD-10-CM | POA: Diagnosis not present

## 2020-04-05 MED ORDER — ESTRADIOL 0.1 MG/24HR TD PTWK: 0.1000 mg | MEDICATED_PATCH | TRANSDERMAL | 12 refills | Status: DC

## 2020-04-05 NOTE — Telephone Encounter (Signed)
Pt states it's hard for the patch to stay on. Also, she has gained 30 lbs since hyst 2 years ago. Is there another patch she can try that she won't gain weight on.

## 2020-04-05 NOTE — Telephone Encounter (Signed)
Patient called stating that she would like to speak with Victorino Dike regarding a Hormone patch. Please contact pt

## 2020-04-05 NOTE — Telephone Encounter (Signed)
Will try climara patch to see if sticks better

## 2020-04-05 NOTE — Addendum Note (Signed)
Addended by: Cyril Mourning A on: 04/05/2020 04:34 PM   Modules accepted: Orders

## 2020-04-13 ENCOUNTER — Other Ambulatory Visit: Payer: Self-pay

## 2020-04-13 ENCOUNTER — Telehealth (HOSPITAL_COMMUNITY): Payer: Medicare HMO | Admitting: Psychiatry

## 2020-04-13 DIAGNOSIS — F5105 Insomnia due to other mental disorder: Secondary | ICD-10-CM

## 2020-04-13 DIAGNOSIS — F900 Attention-deficit hyperactivity disorder, predominantly inattentive type: Secondary | ICD-10-CM

## 2020-04-13 DIAGNOSIS — F330 Major depressive disorder, recurrent, mild: Secondary | ICD-10-CM

## 2020-04-13 DIAGNOSIS — F99 Mental disorder, not otherwise specified: Secondary | ICD-10-CM

## 2020-04-13 DIAGNOSIS — M25511 Pain in right shoulder: Secondary | ICD-10-CM | POA: Diagnosis not present

## 2020-04-13 DIAGNOSIS — F4310 Post-traumatic stress disorder, unspecified: Secondary | ICD-10-CM

## 2020-04-13 MED ORDER — ARIPIPRAZOLE 2 MG PO TABS: 2.0000 mg | ORAL_TABLET | Freq: Every day | ORAL | 1 refills | Status: DC

## 2020-04-13 MED ORDER — BUPROPION HCL ER (XL) 150 MG PO TB24: 450.0000 mg | ORAL_TABLET | Freq: Every day | ORAL | 0 refills | Status: DC

## 2020-04-13 MED ORDER — DIAZEPAM 10 MG PO TABS: 10.0000 mg | ORAL_TABLET | Freq: Two times a day (BID) | ORAL | 1 refills | Status: DC | PRN

## 2020-04-13 NOTE — Progress Notes (Unsigned)
Virtual Visit via Telephone Note  I connected with [name] on 04/13/20 at 10:00 AM EST by telephone and verified that I am speaking with the correct person using two identifiers.  Location: Patient: home Provider: office   I discussed the limitations, risks, security and privacy concerns of performing an evaluation and management service by telephone and the availability of in person appointments. I also discussed with the patient that there may be a patient responsible charge related to this service. The patient expressed understanding and agreed to proceed.   History of Present Illness: ***   Observations/Objective:  General Appearance: unable to assess  Eye Contact:  unable to assess  Speech:  {Speech:22685}  Volume:  {Volume (PAA):22686}  Mood:  {BHH MOOD:22306}  Affect:  {Affect (PAA):22687}  Thought Process:  {Thought Process (PAA):22688}  Orientation:  {BHH ORIENTATION (PAA):22689}  Thought Content:  {Thought Content:22690}  Suicidal Thoughts:  {ST/HT (PAA):22692}  Homicidal Thoughts:  {ST/HT (PAA):22692}  Memory:  {BHH MEMORY:22881}  Judgement:  {Judgement (PAA):22694}  Insight:  {Insight (PAA):22695}  Psychomotor Activity: unable to assess  Concentration:  {Concentration:21399}  Recall:  {BHH GOOD/FAIR/POOR:22877}  Fund of Knowledge:  {BHH GOOD/FAIR/POOR:22877}  Language:  {BHH GOOD/FAIR/POOR:22877}  Akathisia:  unable to assess  Handed:  {Handed:22697}  AIMS (if indicated):     Assets:  {Assets (PAA):22698}  ADL's:  unable to assess  Cognition:  {chl bhh cognition:304700322}  Sleep:         Assessment and Plan:  Start trial of Abilify 2mg  po qD  Follow Up Instructions: In 4-8 weeks or sooner if needed   I discussed the assessment and treatment plan with the patient. The patient was provided an opportunity to ask questions and all were answered. The patient agreed with the plan and demonstrated an understanding of the instructions.   The patient was  advised to call back or seek an in-person evaluation if the symptoms worsen or if the condition fails to improve as anticipated.  I provided 30 minutes of non-face-to-face time during this encounter.   , MD

## 2020-04-19 DIAGNOSIS — M25511 Pain in right shoulder: Secondary | ICD-10-CM | POA: Diagnosis not present

## 2020-05-10 ENCOUNTER — Other Ambulatory Visit (HOSPITAL_COMMUNITY): Payer: Self-pay | Admitting: Psychiatry

## 2020-05-10 DIAGNOSIS — F330 Major depressive disorder, recurrent, mild: Secondary | ICD-10-CM

## 2020-05-15 DIAGNOSIS — Z6827 Body mass index (BMI) 27.0-27.9, adult: Secondary | ICD-10-CM | POA: Diagnosis not present

## 2020-05-15 DIAGNOSIS — J208 Acute bronchitis due to other specified organisms: Secondary | ICD-10-CM | POA: Diagnosis not present

## 2020-05-27 DIAGNOSIS — M542 Cervicalgia: Secondary | ICD-10-CM | POA: Diagnosis not present

## 2020-05-27 DIAGNOSIS — R52 Pain, unspecified: Secondary | ICD-10-CM | POA: Diagnosis not present

## 2020-05-27 DIAGNOSIS — I959 Hypotension, unspecified: Secondary | ICD-10-CM | POA: Diagnosis not present

## 2020-05-31 DIAGNOSIS — Z0100 Encounter for examination of eyes and vision without abnormal findings: Secondary | ICD-10-CM | POA: Diagnosis not present

## 2020-06-06 DIAGNOSIS — M542 Cervicalgia: Secondary | ICD-10-CM | POA: Diagnosis not present

## 2020-06-15 ENCOUNTER — Telehealth (HOSPITAL_COMMUNITY): Payer: Self-pay | Admitting: Psychiatry

## 2020-06-15 DIAGNOSIS — S46811A Strain of other muscles, fascia and tendons at shoulder and upper arm level, right arm, initial encounter: Secondary | ICD-10-CM | POA: Diagnosis not present

## 2020-06-15 DIAGNOSIS — S46011A Strain of muscle(s) and tendon(s) of the rotator cuff of right shoulder, initial encounter: Secondary | ICD-10-CM | POA: Diagnosis not present

## 2020-06-15 DIAGNOSIS — M24111 Other articular cartilage disorders, right shoulder: Secondary | ICD-10-CM | POA: Diagnosis not present

## 2020-06-15 DIAGNOSIS — G8918 Other acute postprocedural pain: Secondary | ICD-10-CM | POA: Diagnosis not present

## 2020-06-15 DIAGNOSIS — M7551 Bursitis of right shoulder: Secondary | ICD-10-CM | POA: Diagnosis not present

## 2020-06-15 DIAGNOSIS — M7541 Impingement syndrome of right shoulder: Secondary | ICD-10-CM | POA: Diagnosis not present

## 2020-06-15 DIAGNOSIS — M19011 Primary osteoarthritis, right shoulder: Secondary | ICD-10-CM | POA: Diagnosis not present

## 2020-06-15 DIAGNOSIS — M7521 Bicipital tendinitis, right shoulder: Secondary | ICD-10-CM | POA: Diagnosis not present

## 2020-06-22 ENCOUNTER — Other Ambulatory Visit: Payer: Self-pay

## 2020-06-22 ENCOUNTER — Telehealth (INDEPENDENT_AMBULATORY_CARE_PROVIDER_SITE_OTHER): Payer: Medicare HMO | Admitting: Psychiatry

## 2020-06-22 DIAGNOSIS — F99 Mental disorder, not otherwise specified: Secondary | ICD-10-CM

## 2020-06-22 DIAGNOSIS — F330 Major depressive disorder, recurrent, mild: Secondary | ICD-10-CM | POA: Diagnosis not present

## 2020-06-22 DIAGNOSIS — F900 Attention-deficit hyperactivity disorder, predominantly inattentive type: Secondary | ICD-10-CM | POA: Diagnosis not present

## 2020-06-22 DIAGNOSIS — F5105 Insomnia due to other mental disorder: Secondary | ICD-10-CM

## 2020-06-22 DIAGNOSIS — F4310 Post-traumatic stress disorder, unspecified: Secondary | ICD-10-CM

## 2020-06-22 MED ORDER — BUPROPION HCL ER (XL) 150 MG PO TB24: 450.0000 mg | ORAL_TABLET | Freq: Every day | ORAL | 0 refills | Status: DC

## 2020-06-22 MED ORDER — DIAZEPAM 10 MG PO TABS: 10.0000 mg | ORAL_TABLET | Freq: Two times a day (BID) | ORAL | 3 refills | Status: DC | PRN

## 2020-06-22 MED ORDER — ARIPIPRAZOLE 2 MG PO TABS: 2.0000 mg | ORAL_TABLET | Freq: Every day | ORAL | 0 refills | Status: DC

## 2020-06-22 NOTE — Progress Notes (Signed)
Virtual Visit via Telephone Note  I connected with Christine Douglas on 06/22/20 at  3:00 PM EST by telephone and verified that I am speaking with the correct person using two identifiers.  Location: Patient: home Provider: office   I discussed the limitations, risks, security and privacy concerns of performing an evaluation and management service by telephone and the availability of in person appointments. I also discussed with the patient that there may be a patient responsible charge related to this service. The patient expressed understanding and agreed to proceed.   History of Present Illness: Christine Douglas fell and had rotator cuff surgery 1 week ago. She is in pain but it is tolerable. Christine Douglas really likes the Abilify. She states it is very effective and she feels good about herself. Her depression is stable. She is motivated and is going out. Her stress tolerance is increased. She is no longer isolating. Christine Douglas denies SI/HI. She denies any manic or hypomanic like episodes. It is overwhelming to care for her mother and at some point she plans to put her in NH. Her sleep is fair but her sleep schedule is a little off. Her PTSD is manageable. It is difficult to go out in crowds.    Observations/Objective:  General Appearance: unable to assess  Eye Contact:  unable to assess  Speech:  Clear and Coherent and Normal Rate  Volume:  Normal  Mood:  Euthymic  Affect:  Full Range  Thought Process:  Goal Directed, Linear and Descriptions of Associations: Intact  Orientation:  Full (Time, Place, and Person)  Thought Content:  Logical  Suicidal Thoughts:  No  Homicidal Thoughts:  No  Memory:  Immediate;   Good  Judgement:  Good  Insight:  Good  Psychomotor Activity: unable to assess  Concentration:  Concentration: Good  Recall:  Good  Fund of Knowledge:  Good  Language:  Good  Akathisia:  unable to assess  Handed:  Right  AIMS (if indicated):     Assets:  Communication Skills Desire for  Improvement Financial Resources/Insurance Housing Leisure Time Social Support Talents/Skills Transportation Vocational/Educational  ADL's:  unable to assess  Cognition:  WNL  Sleep:         Assessment and Plan: 1. MDD (major depressive disorder), recurrent episode, mild (HCC) - ARIPiprazole (ABILIFY) 2 MG tablet; Take 1 tablet (2 mg total) by mouth daily.  Dispense: 90 tablet; Refill: 0 - buPROPion (WELLBUTRIN XL) 150 MG 24 hr tablet; Take 3 tablets (450 mg total) by mouth daily.  Dispense: 270 tablet; Refill: 0  2. Attention deficit hyperactivity disorder (ADHD), predominantly inattentive type - buPROPion (WELLBUTRIN XL) 150 MG 24 hr tablet; Take 3 tablets (450 mg total) by mouth daily.  Dispense: 270 tablet; Refill: 0  3. PTSD (post-traumatic stress disorder) - diazepam (VALIUM) 10 MG tablet; Take 1 tablet (10 mg total) by mouth every 12 (twelve) hours as needed for anxiety.  Dispense: 60 tablet; Refill: 3  4. Insomnia due to other mental disorder - diazepam (VALIUM) 10 MG tablet; Take 1 tablet (10 mg total) by mouth every 12 (twelve) hours as needed for anxiety.  Dispense: 60 tablet; Refill: 3    Follow Up Instructions: In 2-3 months or sooner if needed   I discussed the assessment and treatment plan with the patient. The patient was provided an opportunity to ask questions and all were answered. The patient agreed with the plan and demonstrated an understanding of the instructions.   The patient was advised to call back or  seek an in-person evaluation if the symptoms worsen or if the condition fails to improve as anticipated.  I provided 15 minutes of non-face-to-face time during this encounter.   Oletta Darter, MD

## 2020-06-26 DIAGNOSIS — M19011 Primary osteoarthritis, right shoulder: Secondary | ICD-10-CM | POA: Diagnosis not present

## 2020-07-07 DIAGNOSIS — M25511 Pain in right shoulder: Secondary | ICD-10-CM | POA: Diagnosis not present

## 2020-07-14 DIAGNOSIS — M25511 Pain in right shoulder: Secondary | ICD-10-CM | POA: Diagnosis not present

## 2020-07-18 DIAGNOSIS — M25511 Pain in right shoulder: Secondary | ICD-10-CM | POA: Diagnosis not present

## 2020-07-25 DIAGNOSIS — M25511 Pain in right shoulder: Secondary | ICD-10-CM | POA: Diagnosis not present

## 2020-07-28 DIAGNOSIS — R319 Hematuria, unspecified: Secondary | ICD-10-CM | POA: Diagnosis not present

## 2020-07-28 DIAGNOSIS — M25511 Pain in right shoulder: Secondary | ICD-10-CM | POA: Diagnosis not present

## 2020-07-28 DIAGNOSIS — N390 Urinary tract infection, site not specified: Secondary | ICD-10-CM | POA: Diagnosis not present

## 2020-07-28 DIAGNOSIS — Z7251 High risk heterosexual behavior: Secondary | ICD-10-CM | POA: Diagnosis not present

## 2020-07-28 DIAGNOSIS — R3589 Other polyuria: Secondary | ICD-10-CM | POA: Diagnosis not present

## 2020-08-08 IMAGING — RF DG C-ARM 1-60 MIN
1 series · 2 of 2 positions shown · IV contrast (agent unspecified)
Comparison: MRI December 19, 2018.

CLINICAL DATA: Surgical posterior cervical fusion.

EXAM:
DG C-ARM 1-60 MIN; CERVICAL SPINE - 2-3 VIEW
CONTRAST:  None.
FLUOROSCOPY TIME:  Fluoroscopy Time:  1 minutes 17 seconds.
Number of Acquired Spot Images: 2.

[Series 1: run · 2 of 2 slices shown]
[im 1/2]
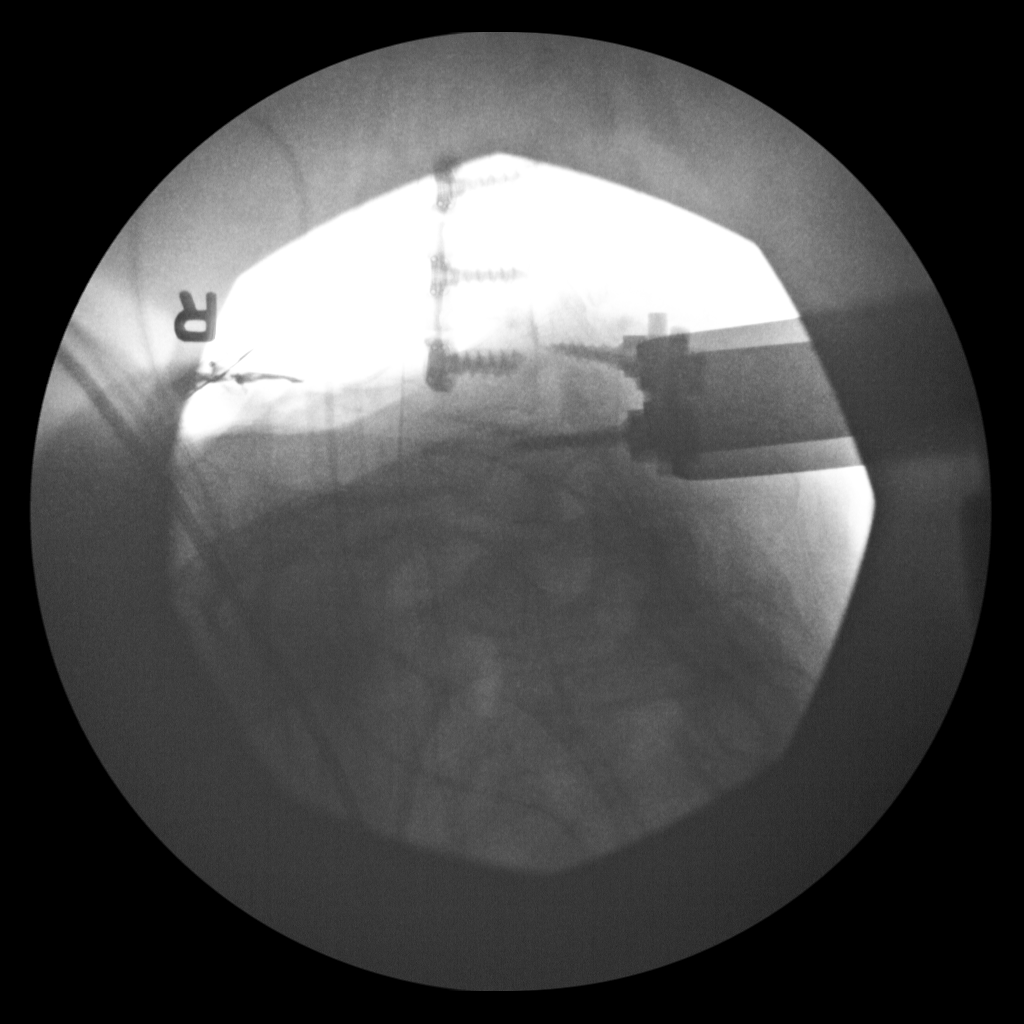
[im 2/2]
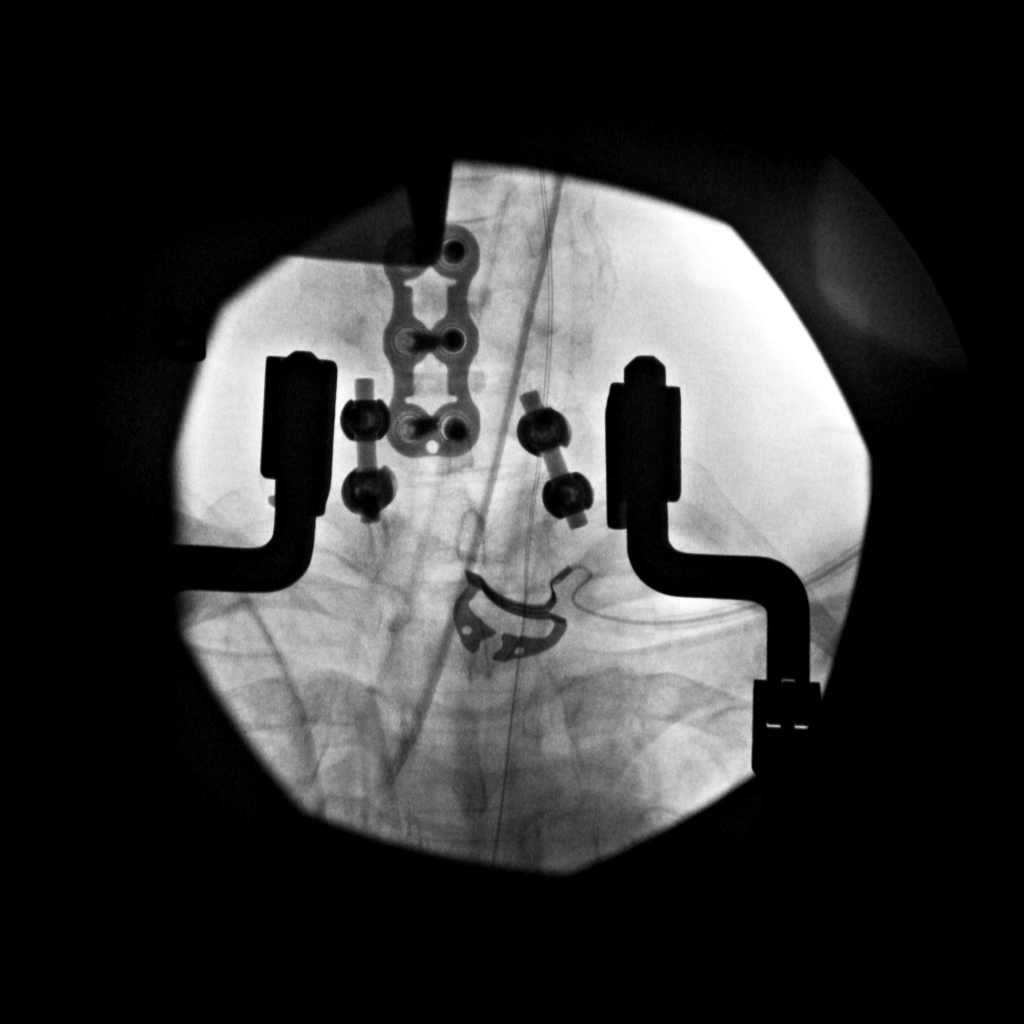

[2 of 2 positions shown; findings below may reference images not displayed]

FINDINGS: Two intraoperative fluoroscopic images demonstrate the patient to be
undergoing surgical posterior fusion of C7-T1. Previous anterior
fusion of C5-6 and C6-7 is noted.
IMPRESSION: Fluoroscopic guidance provided during surgical posterior fusion of
C7-T1.

## 2020-08-08 IMAGING — RF DG CERVICAL SPINE 2 OR 3 VIEWS
1 series · 2 of 2 positions shown · IV contrast (agent unspecified)
Comparison: MRI December 19, 2018.

CLINICAL DATA: Surgical posterior cervical fusion.

EXAM:
DG C-ARM 1-60 MIN; CERVICAL SPINE - 2-3 VIEW
CONTRAST:  None.
FLUOROSCOPY TIME:  Fluoroscopy Time:  1 minutes 17 seconds.
Number of Acquired Spot Images: 2.

[Series 1: run · 2 of 2 slices shown]
[im 1/2]
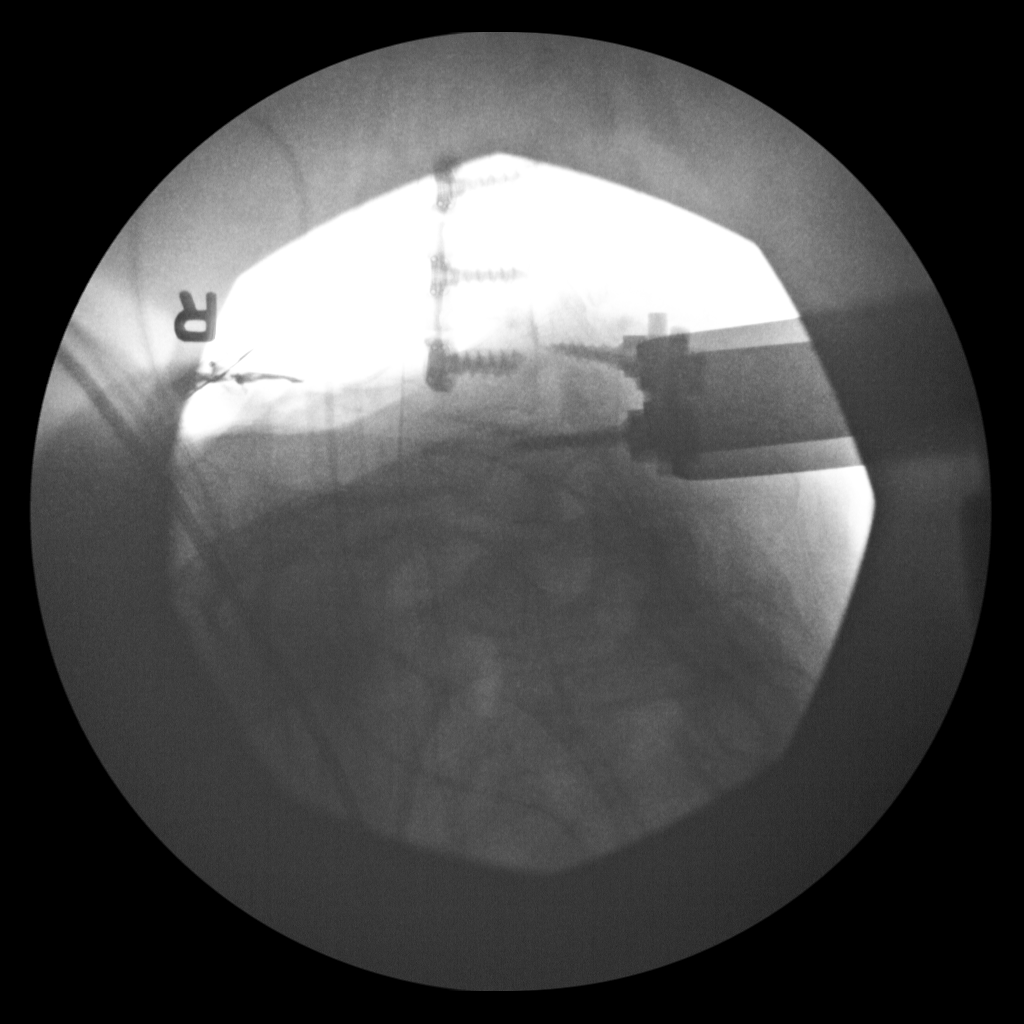
[im 2/2]
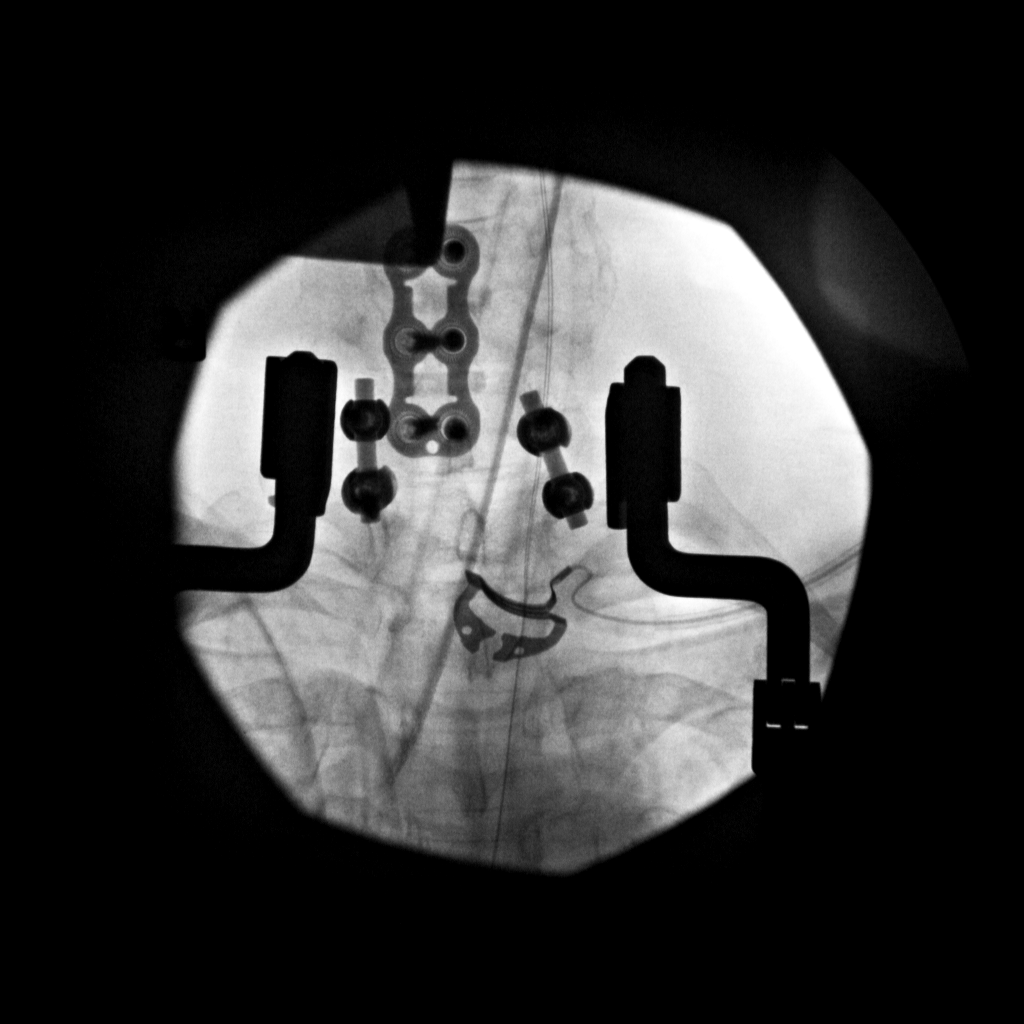

[2 of 2 positions shown; findings below may reference images not displayed]

FINDINGS: Two intraoperative fluoroscopic images demonstrate the patient to be
undergoing surgical posterior fusion of C7-T1. Previous anterior
fusion of C5-6 and C6-7 is noted.
IMPRESSION: Fluoroscopic guidance provided during surgical posterior fusion of
C7-T1.

## 2020-08-21 DIAGNOSIS — R69 Illness, unspecified: Secondary | ICD-10-CM | POA: Diagnosis not present

## 2020-08-24 ENCOUNTER — Telehealth (INDEPENDENT_AMBULATORY_CARE_PROVIDER_SITE_OTHER): Payer: Medicare HMO | Admitting: Psychiatry

## 2020-08-24 DIAGNOSIS — F330 Major depressive disorder, recurrent, mild: Secondary | ICD-10-CM

## 2020-08-24 NOTE — Telephone Encounter (Addendum)
Location: Patient- Home Provider- office  HPI: Christine Douglas states her mother has been in the hospital for 1 week. Christine Douglas is depressed because she can't take care of her mom anymore. She feels that her mom would be better served in a nursing home. She spoke to the staff and they told her that Christine Douglas needs to apply for her mom's medicaid. Christine Douglas states it is too overwhelming to do it. They told Christine Douglas that her mom would need to go back home until the medicaid is approved. Her mom is on oxygen, pureed food and is on bed rest. Christine Douglas states she is not able to do all of that for her mom. This morning the staff came by and asked her mom a lot of questions. It was not done with Christine Douglas present and it made her mom very anxious. Her mom was discharged but it is still in the hospital because Christine Douglas is not able to care for her at home. Christine Douglas is worried that others think she is abandoning her mom but Christine Douglas is only trying to get her mom the best care possible. She is depressed and has been for a while. She feels mentally and physically drained. Christine Douglas is not sleeping well. She denies SI/HI.Marland Kitchen   Plan: Continue current meds F/up appointment on 09/14/2020  Time: 16 minutes

## 2020-09-06 DIAGNOSIS — M19011 Primary osteoarthritis, right shoulder: Secondary | ICD-10-CM | POA: Diagnosis not present

## 2020-09-07 ENCOUNTER — Institutional Professional Consult (permissible substitution): Payer: Medicare HMO | Admitting: Plastic Surgery

## 2020-09-07 DIAGNOSIS — F3341 Major depressive disorder, recurrent, in partial remission: Secondary | ICD-10-CM | POA: Diagnosis not present

## 2020-09-07 DIAGNOSIS — G43919 Migraine, unspecified, intractable, without status migrainosus: Secondary | ICD-10-CM | POA: Diagnosis not present

## 2020-09-07 DIAGNOSIS — Z6826 Body mass index (BMI) 26.0-26.9, adult: Secondary | ICD-10-CM | POA: Diagnosis not present

## 2020-09-14 ENCOUNTER — Other Ambulatory Visit: Payer: Self-pay

## 2020-09-14 ENCOUNTER — Telehealth (INDEPENDENT_AMBULATORY_CARE_PROVIDER_SITE_OTHER): Payer: Medicare HMO | Admitting: Psychiatry

## 2020-09-14 DIAGNOSIS — F99 Mental disorder, not otherwise specified: Secondary | ICD-10-CM | POA: Diagnosis not present

## 2020-09-14 DIAGNOSIS — F330 Major depressive disorder, recurrent, mild: Secondary | ICD-10-CM

## 2020-09-14 DIAGNOSIS — F5105 Insomnia due to other mental disorder: Secondary | ICD-10-CM

## 2020-09-14 DIAGNOSIS — F4310 Post-traumatic stress disorder, unspecified: Secondary | ICD-10-CM

## 2020-09-14 DIAGNOSIS — F900 Attention-deficit hyperactivity disorder, predominantly inattentive type: Secondary | ICD-10-CM | POA: Diagnosis not present

## 2020-09-14 MED ORDER — ARIPIPRAZOLE 5 MG PO TABS: 5.0000 mg | ORAL_TABLET | Freq: Every day | ORAL | 0 refills | Status: DC

## 2020-09-14 MED ORDER — DIAZEPAM 10 MG PO TABS: 10.0000 mg | ORAL_TABLET | Freq: Two times a day (BID) | ORAL | 3 refills | Status: DC | PRN

## 2020-09-14 MED ORDER — BUPROPION HCL ER (XL) 150 MG PO TB24: 450.0000 mg | ORAL_TABLET | Freq: Every day | ORAL | 0 refills | Status: DC

## 2020-09-14 NOTE — Progress Notes (Signed)
Virtual Visit via Telephone Note  I connected with Christine Douglas on 09/14/20 at  2:00 PM EDT by telephone and verified that I am speaking with the correct person using two identifiers.  Location: Patient: home Provider: office   I discussed the limitations, risks, security and privacy concerns of performing an evaluation and management service by telephone and the availability of in person appointments. I also discussed with the patient that there may be a patient responsible charge related to this service. The patient expressed understanding and agreed to proceed.   History of Present Illness: Christine Douglas brought her mom back home on Monday. Her mom was not being well cared for at the hospital. Christine Douglas was worried that her mom would be in worse condition in the nursing home. Her mom is very confused but very demanding of Christine Douglas's attention. Christine Douglas's depression and anxiety are ongoing. She is actually feeling more anxious than depressed. Due to being so busy she hasn't thought much about her depression. Her PTSD is more intense lately. She is unmotivated. She feels restless inside and has racing thoughts. Her sleep is good due to fatigue. She is busy all day with her mom. Christine Douglas is also feeling overly emotional. Her ADHD is not treated and as a result her focus and productivity are poor. Her appetite is down due to being busy. She denies SI/HI.    Observations/Objective:  General Appearance: unable to assess  Eye Contact:  unable to assess  Speech:  Clear and Coherent and Normal Rate  Volume:  Normal  Mood:  Anxious and Depressed  Affect:  Congruent  Thought Process:  Coherent and Descriptions of Associations: Circumstantial  Orientation:  Full (Time, Place, and Person)  Thought Content:  Logical  Suicidal Thoughts:  No  Homicidal Thoughts:  No  Memory:  Immediate;   Good  Judgement:  Good  Insight:  Good  Psychomotor Activity: unable to assess  Concentration:  Concentration: Good  Recall:  Good  Fund  of Knowledge:  Good  Language:  Good  Akathisia:  unable to assess  Handed:  Right  AIMS (if indicated):     Assets:  Communication Skills Desire for Improvement Financial Resources/Insurance Housing Resilience Talents/Skills Transportation Vocational/Educational  ADL's:  unable to assess  Cognition:  WNL  Sleep:         Assessment and Plan: Depression screen [facility] 2/9 09/14/2020 06/22/2020 11/01/2015  Decreased Interest 2 0 3  Down, Depressed, Hopeless 1 0 3  PHQ - 2 Score 3 0 6  Altered sleeping 0 - 3  Tired, decreased energy 3 - 3  Change in appetite 2 - 3  Feeling bad or failure about yourself  1 - 2  Trouble concentrating 3 - 3  Moving slowly or fidgety/restless 0 - 2  Suicidal thoughts 0 - 0  PHQ-9 Score 12 - 22  Difficult doing work/chores Very difficult - Extremely dIfficult  Some recent data might be hidden    Flowsheet Row Video Visit from 09/14/2020 in [facility] Video Visit from 06/22/2020 in [facility]  C-SSRS RISK CATEGORY No Risk No Risk     - increase Abilify to target the depression  1. MDD (major depressive disorder), recurrent episode, mild (HCC) - ARIPiprazole (ABILIFY) 5 MG tablet; Take 1 tablet (5 mg total) by mouth daily.  Dispense: 90 tablet; Refill: 0 - buPROPion (WELLBUTRIN XL) 150 MG 24 hr tablet; Take 3 tablets (450 mg total) by mouth daily.  Dispense: 270 tablet;  Refill: 0  2. Attention deficit hyperactivity disorder (ADHD), predominantly inattentive type - buPROPion (WELLBUTRIN XL) 150 MG 24 hr tablet; Take 3 tablets (450 mg total) by mouth daily.  Dispense: 270 tablet; Refill: 0  3. PTSD (post-traumatic stress disorder) - diazepam (VALIUM) 10 MG tablet; Take 1 tablet (10 mg total) by mouth every 12 (twelve) hours as needed for anxiety.  Dispense: 60 tablet; Refill: 3  4. Insomnia due to other mental disorder - diazepam (VALIUM) 10 MG tablet; Take 1 tablet (10  mg total) by mouth every 12 (twelve) hours as needed for anxiety.  Dispense: 60 tablet; Refill: 3     Follow Up Instructions: In 2-3 months or sooner if needed   I discussed the assessment and treatment plan with the patient. The patient was provided an opportunity to ask questions and all were answered. The patient agreed with the plan and demonstrated an understanding of the instructions.   The patient was advised to call back or seek an in-person evaluation if the symptoms worsen or if the condition fails to improve as anticipated.  I provided 14 minutes of non-face-to-face time during this encounter.   Oletta Darter, MD

## 2020-10-09 DIAGNOSIS — Z79899 Other long term (current) drug therapy: Secondary | ICD-10-CM | POA: Diagnosis not present

## 2020-10-09 DIAGNOSIS — F419 Anxiety disorder, unspecified: Secondary | ICD-10-CM | POA: Diagnosis not present

## 2020-10-09 DIAGNOSIS — G8929 Other chronic pain: Secondary | ICD-10-CM | POA: Diagnosis not present

## 2020-10-09 DIAGNOSIS — Z6826 Body mass index (BMI) 26.0-26.9, adult: Secondary | ICD-10-CM | POA: Diagnosis not present

## 2020-10-09 DIAGNOSIS — M542 Cervicalgia: Secondary | ICD-10-CM | POA: Diagnosis not present

## 2020-10-09 DIAGNOSIS — F32A Depression, unspecified: Secondary | ICD-10-CM | POA: Diagnosis not present

## 2020-10-12 DIAGNOSIS — F3341 Major depressive disorder, recurrent, in partial remission: Secondary | ICD-10-CM | POA: Diagnosis not present

## 2020-10-12 DIAGNOSIS — G43919 Migraine, unspecified, intractable, without status migrainosus: Secondary | ICD-10-CM | POA: Diagnosis not present

## 2020-10-12 DIAGNOSIS — Z6826 Body mass index (BMI) 26.0-26.9, adult: Secondary | ICD-10-CM | POA: Diagnosis not present

## 2020-10-12 DIAGNOSIS — K581 Irritable bowel syndrome with constipation: Secondary | ICD-10-CM | POA: Diagnosis not present

## 2020-10-16 DIAGNOSIS — M542 Cervicalgia: Secondary | ICD-10-CM | POA: Diagnosis not present

## 2020-10-16 DIAGNOSIS — G8929 Other chronic pain: Secondary | ICD-10-CM | POA: Diagnosis not present

## 2020-10-16 DIAGNOSIS — Z79899 Other long term (current) drug therapy: Secondary | ICD-10-CM | POA: Diagnosis not present

## 2020-10-16 DIAGNOSIS — F419 Anxiety disorder, unspecified: Secondary | ICD-10-CM | POA: Diagnosis not present

## 2020-10-16 DIAGNOSIS — Z20822 Contact with and (suspected) exposure to covid-19: Secondary | ICD-10-CM | POA: Diagnosis not present

## 2020-10-16 DIAGNOSIS — F32A Depression, unspecified: Secondary | ICD-10-CM | POA: Diagnosis not present

## 2020-10-16 DIAGNOSIS — Z6826 Body mass index (BMI) 26.0-26.9, adult: Secondary | ICD-10-CM | POA: Diagnosis not present

## 2020-10-21 DIAGNOSIS — H40033 Anatomical narrow angle, bilateral: Secondary | ICD-10-CM | POA: Diagnosis not present

## 2020-10-21 DIAGNOSIS — H2513 Age-related nuclear cataract, bilateral: Secondary | ICD-10-CM | POA: Diagnosis not present

## 2020-10-30 DIAGNOSIS — Z20822 Contact with and (suspected) exposure to covid-19: Secondary | ICD-10-CM | POA: Diagnosis not present

## 2020-10-30 DIAGNOSIS — J3489 Other specified disorders of nose and nasal sinuses: Secondary | ICD-10-CM | POA: Diagnosis not present

## 2020-11-03 DIAGNOSIS — Z20822 Contact with and (suspected) exposure to covid-19: Secondary | ICD-10-CM | POA: Diagnosis not present

## 2020-11-03 DIAGNOSIS — R0602 Shortness of breath: Secondary | ICD-10-CM | POA: Diagnosis not present

## 2020-11-03 DIAGNOSIS — M129 Arthropathy, unspecified: Secondary | ICD-10-CM | POA: Diagnosis not present

## 2020-11-03 DIAGNOSIS — E559 Vitamin D deficiency, unspecified: Secondary | ICD-10-CM | POA: Diagnosis not present

## 2020-11-03 DIAGNOSIS — G8929 Other chronic pain: Secondary | ICD-10-CM | POA: Diagnosis not present

## 2020-11-03 DIAGNOSIS — Z79899 Other long term (current) drug therapy: Secondary | ICD-10-CM | POA: Diagnosis not present

## 2020-11-03 DIAGNOSIS — Z6826 Body mass index (BMI) 26.0-26.9, adult: Secondary | ICD-10-CM | POA: Diagnosis not present

## 2020-11-03 DIAGNOSIS — M542 Cervicalgia: Secondary | ICD-10-CM | POA: Diagnosis not present

## 2020-11-03 DIAGNOSIS — F32A Depression, unspecified: Secondary | ICD-10-CM | POA: Diagnosis not present

## 2020-11-03 DIAGNOSIS — R635 Abnormal weight gain: Secondary | ICD-10-CM | POA: Diagnosis not present

## 2020-11-03 DIAGNOSIS — E611 Iron deficiency: Secondary | ICD-10-CM | POA: Diagnosis not present

## 2020-11-03 DIAGNOSIS — R5383 Other fatigue: Secondary | ICD-10-CM | POA: Diagnosis not present

## 2020-11-16 ENCOUNTER — Encounter: Payer: Self-pay | Admitting: Internal Medicine

## 2020-11-23 ENCOUNTER — Ambulatory Visit (AMBULATORY_SURGERY_CENTER): Payer: Self-pay | Admitting: *Deleted

## 2020-11-23 ENCOUNTER — Other Ambulatory Visit: Payer: Self-pay

## 2020-11-23 VITALS — Ht 64.0 in | Wt 154.0 lb

## 2020-11-23 DIAGNOSIS — Z1211 Encounter for screening for malignant neoplasm of colon: Secondary | ICD-10-CM

## 2020-11-23 NOTE — Progress Notes (Signed)

## 2020-11-30 ENCOUNTER — Other Ambulatory Visit: Payer: Self-pay

## 2020-11-30 ENCOUNTER — Telehealth (HOSPITAL_COMMUNITY): Payer: Medicare HMO | Admitting: Psychiatry

## 2020-11-30 ENCOUNTER — Telehealth (HOSPITAL_COMMUNITY): Payer: Self-pay | Admitting: Psychiatry

## 2020-11-30 NOTE — Telephone Encounter (Signed)
I called Christine Douglas at our scheduled appointment time today. She shared that she was in line at the bank. Her mother is going into a nursing home tomorrow. [name] asked to cancel today and we rescheduled to Aug 11 at 9:45am.

## 2020-12-04 DIAGNOSIS — Z9181 History of falling: Secondary | ICD-10-CM | POA: Diagnosis not present

## 2020-12-04 DIAGNOSIS — G8929 Other chronic pain: Secondary | ICD-10-CM | POA: Diagnosis not present

## 2020-12-04 DIAGNOSIS — Z6826 Body mass index (BMI) 26.0-26.9, adult: Secondary | ICD-10-CM | POA: Diagnosis not present

## 2020-12-04 DIAGNOSIS — M542 Cervicalgia: Secondary | ICD-10-CM | POA: Diagnosis not present

## 2020-12-04 DIAGNOSIS — R635 Abnormal weight gain: Secondary | ICD-10-CM | POA: Diagnosis not present

## 2020-12-04 DIAGNOSIS — Z79899 Other long term (current) drug therapy: Secondary | ICD-10-CM | POA: Diagnosis not present

## 2020-12-06 ENCOUNTER — Telehealth: Payer: Self-pay | Admitting: Internal Medicine

## 2020-12-06 DIAGNOSIS — Z20822 Contact with and (suspected) exposure to covid-19: Secondary | ICD-10-CM | POA: Diagnosis not present

## 2020-12-06 NOTE — Telephone Encounter (Signed)
Patient called states she was with her mother past few days and has tested positive for Covid-The patient has not been tested as of yet please advise.

## 2020-12-07 ENCOUNTER — Encounter: Payer: Self-pay | Admitting: Internal Medicine

## 2020-12-07 ENCOUNTER — Other Ambulatory Visit: Payer: Self-pay

## 2020-12-07 ENCOUNTER — Ambulatory Visit (AMBULATORY_SURGERY_CENTER): Payer: Medicare HMO | Admitting: Internal Medicine

## 2020-12-07 VITALS — BP 106/71 | HR 85 | Temp 98.4°F | Resp 18 | Ht 64.0 in | Wt 154.0 lb

## 2020-12-07 DIAGNOSIS — Z1211 Encounter for screening for malignant neoplasm of colon: Secondary | ICD-10-CM

## 2020-12-07 MED ORDER — SODIUM CHLORIDE 0.9 % IV SOLN: 500.0000 mL | Freq: Once | INTRAVENOUS | Status: DC

## 2020-12-07 NOTE — Op Note (Signed)
[facility] Patient Name: Christine Douglas Procedure Date: 12/07/2020 1:52 PM MRN: [id number] Endoscopist: Iva Boop , MD Age: 60 Referring MD:  Date of Birth: [birth year] Gender: Female Account #: [id number] Procedure:                Colonoscopy Indications:              Screening for colorectal malignant neoplasm, This                            is the patient's first colonoscopy Medicines:                Propofol per Anesthesia, Monitored Anesthesia Care Procedure:                Pre-Anesthesia Assessment:                           - Prior to the procedure, a History and Physical                            was performed, and patient medications and                            allergies were reviewed. The patient's tolerance of                            previous anesthesia was also reviewed. The risks                            and benefits of the procedure and the sedation                            options and risks were discussed with the patient.                            All questions were answered, and informed consent                            was obtained. Prior Anticoagulants: The patient has                            taken no previous anticoagulant or antiplatelet                            agents. ASA Grade Assessment: II - A patient with                            mild systemic disease. After reviewing the risks                            and benefits, the patient was deemed in                            satisfactory condition to undergo the procedure.  After obtaining informed consent, the colonoscope                            was passed under direct vision. Throughout the                            procedure, the patient's blood pressure, pulse, and                            oxygen saturations were monitored continuously. The                            Olympus PCF-H190DL (#[id number]) Colonoscope was                             introduced through the anus and advanced to the the                            cecum, identified by appendiceal orifice and                            ileocecal valve. The colonoscopy was performed                            without difficulty. The patient tolerated the                            procedure well. The quality of the bowel                            preparation was good. The ileocecal valve,                            appendiceal orifice, and rectum were photographed. Scope In: 2:15:27 PM Scope Out: 2:25:57 PM Scope Withdrawal Time: 0 hours 7 minutes 54 seconds  Total Procedure Duration: 0 hours 10 minutes 30 seconds  Findings:                 The perianal and digital rectal examinations were                            normal.                           Multiple diverticula were found in the sigmoid                            colon.                           The exam was otherwise without abnormality on                            direct and retroflexion views. Complications:            No immediate complications. Estimated Blood Loss:  Estimated blood loss: none. Impression:               - Diverticulosis in the sigmoid colon.                           - The examination was otherwise normal on direct                            and retroflexion views.                           - No specimens collected. Recommendation:           - Patient has a contact number available for                            emergencies. The signs and symptoms of potential                            delayed complications were discussed with the                            patient. Return to normal activities tomorrow.                            Written discharge instructions were provided to the                            patient.                           - Resume previous diet.                           - Continue present medications.                           - Repeat colonoscopy in 10 years for  screening                            purposes. Iva Boop, MD 12/07/2020 2:30:21 PM This report has been signed electronically.

## 2020-12-07 NOTE — Progress Notes (Signed)
To PACU, VSS. Report to [email or web address] 

## 2020-12-07 NOTE — Patient Instructions (Addendum)
No polyps or cancer seen.  You do have diverticulosis - thickened muscle rings and pouches in the colon wall. Please read the handout about this condition.  Next routine colonoscopy or other screening test in 10 years - 2032.  I appreciate the opportunity to care for you. Iva Boop, MD, [facility]   Please see handout given to you on Diverticulosis. THank you for letting us take care of your healthcare needs today.  YOU HAD AN ENDOSCOPIC PROCEDURE TODAY AT THE [facility]:   Refer to the procedure report that was given to you for any specific questions about what was found during the examination.  If the procedure report does not answer your questions, please call your gastroenterologist to clarify.  If you requested that your care partner not be given the details of your procedure findings, then the procedure report has been included in a sealed envelope for you to review at your convenience later.  YOU SHOULD EXPECT: Some feelings of bloating in the abdomen. Passage of more gas than usual.  Walking can help get rid of the air that was put into your GI tract during the procedure and reduce the bloating. If you had a lower endoscopy (such as a colonoscopy or flexible sigmoidoscopy) you may notice spotting of blood in your stool or on the toilet paper. If you underwent a bowel prep for your procedure, you may not have a normal bowel movement for a few days.  Please Note:  You might notice some irritation and congestion in your nose or some drainage.  This is from the oxygen used during your procedure.  There is no need for concern and it should clear up in a day or so.  SYMPTOMS TO REPORT IMMEDIATELY:  Following lower endoscopy (colonoscopy or flexible sigmoidoscopy):  Excessive amounts of blood in the stool  Significant tenderness or worsening of abdominal pains  Swelling of the abdomen that is new, acute  Fever of 100F or higher   For urgent or emergent issues, a  gastroenterologist can be reached at any hour by calling [phone]. Do not use MyChart messaging for urgent concerns.    DIET:  We do recommend a small meal at first, but then you may proceed to your regular diet.  Drink plenty of fluids but you should avoid alcoholic beverages for 24 hours.  ACTIVITY:  You should plan to take it easy for the rest of today and you should NOT DRIVE or use heavy machinery until tomorrow (because of the sedation medicines used during the test).    FOLLOW UP: Our staff will call the number listed on your records 48-72 hours following your procedure to check on you and address any questions or concerns that you may have regarding the information given to you following your procedure. If we do not reach you, we will leave a message.  We will attempt to reach you two times.  During this call, we will ask if you have developed any symptoms of COVID 19. If you develop any symptoms (ie: fever, flu-like symptoms, shortness of breath, cough etc.) before then, please call [phone].  If you test positive for Covid 19 in the 2 weeks post procedure, please call and report this information to [place].    If any biopsies were taken you will be contacted by phone or by letter within the next 1-3 weeks.  Please call us at [phone] if you have not heard about the biopsies in 3 weeks.  SIGNATURES/CONFIDENTIALITY: You and/or your care partner have signed paperwork which will be entered into your electronic medical record.  These signatures attest to the fact that that the information above on your After Visit Summary has been reviewed and is understood.  Full responsibility of the confidentiality of this discharge information lies with you and/or your care-partner.

## 2020-12-07 NOTE — Progress Notes (Signed)
Vital signs checked by:DT  The patient states no changes in medical or surgical history since pre-visit screening on 11/23/2020.    

## 2020-12-11 ENCOUNTER — Telehealth: Payer: Self-pay | Admitting: *Deleted

## 2020-12-11 NOTE — Telephone Encounter (Signed)
  Follow up Call-  Call back number 12/07/2020  Post procedure Call Back phone  # [phone]  Permission to leave phone message Yes  Some recent data might be hidden     Patient questions:  Do you have a fever, pain , or abdominal swelling? No. Pain Score  0 *  Have you tolerated food without any problems? Yes.    Have you been able to return to your normal activities? Yes.    Do you have any questions about your discharge instructions: Diet   No. Medications  No. Follow up visit  No.  Do you have questions or concerns about your Care? No.  Actions: * If pain score is 4 or above: No action needed, pain <4.

## 2020-12-14 ENCOUNTER — Telehealth (INDEPENDENT_AMBULATORY_CARE_PROVIDER_SITE_OTHER): Payer: Medicare HMO | Admitting: Psychiatry

## 2020-12-14 ENCOUNTER — Other Ambulatory Visit: Payer: Self-pay

## 2020-12-14 DIAGNOSIS — F4310 Post-traumatic stress disorder, unspecified: Secondary | ICD-10-CM

## 2020-12-14 DIAGNOSIS — F5105 Insomnia due to other mental disorder: Secondary | ICD-10-CM | POA: Diagnosis not present

## 2020-12-14 DIAGNOSIS — F330 Major depressive disorder, recurrent, mild: Secondary | ICD-10-CM | POA: Diagnosis not present

## 2020-12-14 DIAGNOSIS — F99 Mental disorder, not otherwise specified: Secondary | ICD-10-CM | POA: Diagnosis not present

## 2020-12-14 DIAGNOSIS — F900 Attention-deficit hyperactivity disorder, predominantly inattentive type: Secondary | ICD-10-CM

## 2020-12-14 MED ORDER — DIAZEPAM 10 MG PO TABS: 10.0000 mg | ORAL_TABLET | Freq: Two times a day (BID) | ORAL | 2 refills | Status: DC | PRN

## 2020-12-14 MED ORDER — BUPROPION HCL ER (XL) 150 MG PO TB24: 450.0000 mg | ORAL_TABLET | Freq: Every day | ORAL | 0 refills | Status: DC

## 2020-12-14 NOTE — Progress Notes (Signed)
Virtual Visit via Telephone Note  I connected with Christine Douglas on 12/14/20 at  9:45 AM EDT by telephone and verified that I am speaking with the correct person using two identifiers.  Location: Patient: home Provider: office   I discussed the limitations, risks, security and privacy concerns of performing an evaluation and management service by telephone and the availability of in person appointments. I also discussed with the patient that there may be a patient responsible charge related to this service. The patient expressed understanding and agreed to proceed.   History of Present Illness: Christine Douglas is doing well. Her mom has officially moved into a nursing home. Her mom has COVID and is currently quarantined. It is making her mom's adjustment difficult. Christine Douglas is now working about 4 hours/day and likes it. It helps with adding structure to her day and gets her out of the house for a little while. She really doesn't like to leave her home due to the PTSD.  She is not as down about the move as she was the last time. Christine Douglas has come to terms that her mom needed more care than Christine Douglas was able to do. Her sleep is ok. She has racing thoughts when she first lies down. Christine Douglas takes the Valium at bedtime or when she gets home from work. She tries to take it only once/day. She continues to have on/off nightmares. Christine Douglas is having less intrusive memories. She is denying hypervigilance. Her depression is  manageable and feels appropriate to the situation. She just put her mom in the NH 10 days ago. Christine Douglas is enjoying her time alone. She denies anhedonia and negative self talk. She denies SI/HI. ADHD symptoms are ongoing.    Observations/Objective:  General Appearance: unable to assess  Eye Contact:  unable to assess  Speech:  Clear and Coherent and Normal Rate  Volume:  Normal  Mood:  Anxious and Depressed  Affect:  Full Range  Thought Process:  Goal Directed, Linear, and Descriptions of Associations: Intact   Orientation:  Full (Time, Place, and Person)  Thought Content:  Logical  Suicidal Thoughts:  No  Homicidal Thoughts:  No  Memory:  Immediate;   Good  Judgement:  Good  Insight:  Good  Psychomotor Activity: unable to assess  Concentration:  Concentration: Good  Recall:  Good  Fund of Knowledge:  Good  Language:  Good  Akathisia:  unable to assess  Handed:  unable to assess  AIMS (if indicated):     Assets:  Communication Skills Desire for Improvement Financial Resources/Insurance Housing Resilience Social Support Talents/Skills Transportation Vocational/Educational  ADL's:  unable to assess  Cognition:  WNL  Sleep:        Assessment and Plan: 1. PTSD (post-traumatic stress disorder) - diazepam (VALIUM) 10 MG tablet; Take 1 tablet (10 mg total) by mouth every 12 (twelve) hours as needed for anxiety.  Dispense: 60 tablet; Refill: 2  2. Insomnia due to other mental disorder - diazepam (VALIUM) 10 MG tablet; Take 1 tablet (10 mg total) by mouth every 12 (twelve) hours as needed for anxiety.  Dispense: 60 tablet; Refill: 2  3. MDD (major depressive disorder), recurrent episode, mild (HCC) - buPROPion (WELLBUTRIN XL) 150 MG 24 hr tablet; Take 3 tablets (450 mg total) by mouth daily.  Dispense: 270 tablet; Refill: 0  4. Attention deficit hyperactivity disorder (ADHD), predominantly inattentive type - buPROPion (WELLBUTRIN XL) 150 MG 24 hr tablet; Take 3 tablets (450 mg total) by mouth daily.  Dispense: 270 tablet;  Refill: 0   Follow Up Instructions: In 2-3 months or sooner if needed   I discussed the assessment and treatment plan with the patient. The patient was provided an opportunity to ask questions and all were answered. The patient agreed with the plan and demonstrated an understanding of the instructions.   The patient was advised to call back or seek an in-person evaluation if the symptoms worsen or if the condition fails to improve as anticipated.  I provided 15  minutes of non-face-to-face time during this encounter.   Oletta Darter, MD

## 2021-01-03 DIAGNOSIS — K006 Disturbances in tooth eruption: Secondary | ICD-10-CM | POA: Diagnosis not present

## 2021-01-03 DIAGNOSIS — R69 Illness, unspecified: Secondary | ICD-10-CM | POA: Diagnosis not present

## 2021-01-04 DIAGNOSIS — Z79899 Other long term (current) drug therapy: Secondary | ICD-10-CM | POA: Diagnosis not present

## 2021-01-04 DIAGNOSIS — M542 Cervicalgia: Secondary | ICD-10-CM | POA: Diagnosis not present

## 2021-01-04 DIAGNOSIS — G8929 Other chronic pain: Secondary | ICD-10-CM | POA: Diagnosis not present

## 2021-01-04 DIAGNOSIS — Z9181 History of falling: Secondary | ICD-10-CM | POA: Diagnosis not present

## 2021-01-04 DIAGNOSIS — Z6826 Body mass index (BMI) 26.0-26.9, adult: Secondary | ICD-10-CM | POA: Diagnosis not present

## 2021-01-04 DIAGNOSIS — R635 Abnormal weight gain: Secondary | ICD-10-CM | POA: Diagnosis not present

## 2021-01-14 ENCOUNTER — Other Ambulatory Visit (HOSPITAL_COMMUNITY): Payer: Self-pay | Admitting: Psychiatry

## 2021-01-14 DIAGNOSIS — F330 Major depressive disorder, recurrent, mild: Secondary | ICD-10-CM

## 2021-02-01 DIAGNOSIS — R635 Abnormal weight gain: Secondary | ICD-10-CM | POA: Diagnosis not present

## 2021-02-01 DIAGNOSIS — Z9181 History of falling: Secondary | ICD-10-CM | POA: Diagnosis not present

## 2021-02-01 DIAGNOSIS — M542 Cervicalgia: Secondary | ICD-10-CM | POA: Diagnosis not present

## 2021-02-01 DIAGNOSIS — G8929 Other chronic pain: Secondary | ICD-10-CM | POA: Diagnosis not present

## 2021-02-01 DIAGNOSIS — Z6826 Body mass index (BMI) 26.0-26.9, adult: Secondary | ICD-10-CM | POA: Diagnosis not present

## 2021-02-01 DIAGNOSIS — Z79899 Other long term (current) drug therapy: Secondary | ICD-10-CM | POA: Diagnosis not present

## 2021-02-12 DIAGNOSIS — Z0000 Encounter for general adult medical examination without abnormal findings: Secondary | ICD-10-CM | POA: Diagnosis not present

## 2021-02-12 DIAGNOSIS — K219 Gastro-esophageal reflux disease without esophagitis: Secondary | ICD-10-CM | POA: Diagnosis not present

## 2021-02-12 DIAGNOSIS — G43919 Migraine, unspecified, intractable, without status migrainosus: Secondary | ICD-10-CM | POA: Diagnosis not present

## 2021-02-12 DIAGNOSIS — F3341 Major depressive disorder, recurrent, in partial remission: Secondary | ICD-10-CM | POA: Diagnosis not present

## 2021-02-12 DIAGNOSIS — K581 Irritable bowel syndrome with constipation: Secondary | ICD-10-CM | POA: Diagnosis not present

## 2021-02-12 DIAGNOSIS — Z6825 Body mass index (BMI) 25.0-25.9, adult: Secondary | ICD-10-CM | POA: Diagnosis not present

## 2021-03-01 ENCOUNTER — Other Ambulatory Visit: Payer: Self-pay

## 2021-03-01 ENCOUNTER — Encounter (HOSPITAL_COMMUNITY): Payer: Self-pay

## 2021-03-01 ENCOUNTER — Ambulatory Visit (HOSPITAL_BASED_OUTPATIENT_CLINIC_OR_DEPARTMENT_OTHER): Payer: Self-pay | Admitting: Psychiatry

## 2021-03-01 DIAGNOSIS — Z91199 Patient's noncompliance with other medical treatment and regimen due to unspecified reason: Secondary | ICD-10-CM

## 2021-03-01 NOTE — Progress Notes (Signed)
I called the patient at our scheduled appointment time. There was no answer. I was unable to leave a voice message as the voice mail box was full.

## 2021-03-07 DIAGNOSIS — Z79899 Other long term (current) drug therapy: Secondary | ICD-10-CM | POA: Diagnosis not present

## 2021-03-07 DIAGNOSIS — Z6826 Body mass index (BMI) 26.0-26.9, adult: Secondary | ICD-10-CM | POA: Diagnosis not present

## 2021-03-07 DIAGNOSIS — M542 Cervicalgia: Secondary | ICD-10-CM | POA: Diagnosis not present

## 2021-03-07 DIAGNOSIS — G8929 Other chronic pain: Secondary | ICD-10-CM | POA: Diagnosis not present

## 2021-03-07 DIAGNOSIS — Z9181 History of falling: Secondary | ICD-10-CM | POA: Diagnosis not present

## 2021-03-07 DIAGNOSIS — R635 Abnormal weight gain: Secondary | ICD-10-CM | POA: Diagnosis not present

## 2021-03-08 ENCOUNTER — Telehealth (HOSPITAL_BASED_OUTPATIENT_CLINIC_OR_DEPARTMENT_OTHER): Payer: Medicare HMO | Admitting: Psychiatry

## 2021-03-08 ENCOUNTER — Other Ambulatory Visit: Payer: Self-pay

## 2021-03-08 DIAGNOSIS — F900 Attention-deficit hyperactivity disorder, predominantly inattentive type: Secondary | ICD-10-CM | POA: Diagnosis not present

## 2021-03-08 DIAGNOSIS — F4310 Post-traumatic stress disorder, unspecified: Secondary | ICD-10-CM | POA: Diagnosis not present

## 2021-03-08 DIAGNOSIS — F330 Major depressive disorder, recurrent, mild: Secondary | ICD-10-CM | POA: Diagnosis not present

## 2021-03-08 DIAGNOSIS — F5105 Insomnia due to other mental disorder: Secondary | ICD-10-CM

## 2021-03-08 DIAGNOSIS — F99 Mental disorder, not otherwise specified: Secondary | ICD-10-CM

## 2021-03-08 MED ORDER — DIAZEPAM 10 MG PO TABS: 10.0000 mg | ORAL_TABLET | Freq: Two times a day (BID) | ORAL | 2 refills | Status: DC | PRN

## 2021-03-08 MED ORDER — BUPROPION HCL ER (XL) 150 MG PO TB24: 450.0000 mg | ORAL_TABLET | Freq: Every day | ORAL | 0 refills | Status: DC

## 2021-03-08 NOTE — Progress Notes (Signed)
Virtual Visit via Telephone Note  I connected with Christine Douglas on 03/08/21 at  3:30 PM EDT by telephone and verified that I am speaking with the correct person using two identifiers.  Location: Patient: home Provider: office   I discussed the limitations, risks, security and privacy concerns of performing an evaluation and management service by telephone and the availability of in person appointments. I also discussed with the patient that there may be a patient responsible charge related to this service. The patient expressed understanding and agreed to proceed.   History of Present Illness: Christine Douglas shares that the father of her daughter died 10 days ago. Her daughter is struggling but Christine Douglas states she is ok. Her depression comes and goes. She is not seeing her mom as much and that it helps. Christine Douglas feels guilty but knows it is better this way. She feels she is headed in a good direction. Christine Douglas was given Buspar by her PCP due to depression but she stopped it because it is was interfering with her sleep. Her PTSD is ok. She prefers to stay indoors. She has some racing thoughts. Distraction and keeping busy help a lot with mood and anxiety. She denies SI/HI.    Observations/Objective:  General Appearance: unable to assess  Eye Contact:  unable to assess  Speech:  Clear and Coherent and Normal Rate  Volume:  Normal  Mood:  Anxious and Depressed  Affect:  Full Range  Thought Process:  Coherent and Descriptions of Associations: Circumstantial  Orientation:  Full (Time, Place, and Person)  Thought Content:  Logical  Suicidal Thoughts:  No  Homicidal Thoughts:  No  Memory:  Immediate;   Good  Judgement:  Good  Insight:  Good  Psychomotor Activity: unable to assess  Concentration:  Concentration: Good  Recall:  Good  Fund of Knowledge:  Good  Language:  Good  Akathisia:  unable to assess  Handed:  unable to assess  AIMS (if indicated):     Assets:  Communication Skills Desire for  Improvement Financial Resources/Insurance Housing Resilience Social Support Talents/Skills Transportation Vocational/Educational  ADL's:  unable to assess  Cognition:  WNL  Sleep:        Assessment and Plan: 1. MDD (major depressive disorder), recurrent episode, mild (HCC) - buPROPion (WELLBUTRIN XL) 150 MG 24 hr tablet; Take 3 tablets (450 mg total) by mouth daily.  Dispense: 270 tablet; Refill: 0  2. Attention deficit hyperactivity disorder (ADHD), predominantly inattentive type - buPROPion (WELLBUTRIN XL) 150 MG 24 hr tablet; Take 3 tablets (450 mg total) by mouth daily.  Dispense: 270 tablet; Refill: 0  3. PTSD (post-traumatic stress disorder) - diazepam (VALIUM) 10 MG tablet; Take 1 tablet (10 mg total) by mouth every 12 (twelve) hours as needed for anxiety.  Dispense: 60 tablet; Refill: 2  4. Insomnia due to other mental disorder - diazepam (VALIUM) 10 MG tablet; Take 1 tablet (10 mg total) by mouth every 12 (twelve) hours as needed for anxiety.  Dispense: 60 tablet; Refill: 2   Follow Up Instructions: In 2-3 months or sooner if needed   I discussed the assessment and treatment plan with the patient. The patient was provided an opportunity to ask questions and all were answered. The patient agreed with the plan and demonstrated an understanding of the instructions.   The patient was advised to call back or seek an in-person evaluation if the symptoms worsen or if the condition fails to improve as anticipated.  I provided 9 minutes of non-face-to-face  time during this encounter.   Charlcie Cradle, MD

## 2021-03-13 DIAGNOSIS — Z79899 Other long term (current) drug therapy: Secondary | ICD-10-CM | POA: Diagnosis not present

## 2021-04-06 DIAGNOSIS — G8929 Other chronic pain: Secondary | ICD-10-CM | POA: Diagnosis not present

## 2021-04-06 DIAGNOSIS — R635 Abnormal weight gain: Secondary | ICD-10-CM | POA: Diagnosis not present

## 2021-04-06 DIAGNOSIS — M542 Cervicalgia: Secondary | ICD-10-CM | POA: Diagnosis not present

## 2021-04-06 DIAGNOSIS — Z79899 Other long term (current) drug therapy: Secondary | ICD-10-CM | POA: Diagnosis not present

## 2021-04-06 DIAGNOSIS — Z9181 History of falling: Secondary | ICD-10-CM | POA: Diagnosis not present

## 2021-04-06 DIAGNOSIS — Z6826 Body mass index (BMI) 26.0-26.9, adult: Secondary | ICD-10-CM | POA: Diagnosis not present

## 2021-04-30 DIAGNOSIS — J329 Chronic sinusitis, unspecified: Secondary | ICD-10-CM | POA: Diagnosis not present

## 2021-04-30 DIAGNOSIS — R059 Cough, unspecified: Secondary | ICD-10-CM | POA: Diagnosis not present

## 2021-04-30 DIAGNOSIS — R509 Fever, unspecified: Secondary | ICD-10-CM | POA: Diagnosis not present

## 2021-04-30 DIAGNOSIS — Z6827 Body mass index (BMI) 27.0-27.9, adult: Secondary | ICD-10-CM | POA: Diagnosis not present

## 2021-04-30 DIAGNOSIS — F4321 Adjustment disorder with depressed mood: Secondary | ICD-10-CM | POA: Diagnosis not present

## 2021-04-30 DIAGNOSIS — R0981 Nasal congestion: Secondary | ICD-10-CM | POA: Diagnosis not present

## 2021-05-31 ENCOUNTER — Other Ambulatory Visit: Payer: Self-pay

## 2021-05-31 ENCOUNTER — Telehealth (HOSPITAL_COMMUNITY): Payer: Medicare HMO | Admitting: Psychiatry

## 2021-05-31 ENCOUNTER — Telehealth (HOSPITAL_COMMUNITY): Payer: Self-pay | Admitting: Psychiatry

## 2021-05-31 NOTE — Telephone Encounter (Signed)
I called [place] several times during our scheduled appointment time. I was unable to leave a voice message. Pt may call to rescheduled her appointment.

## 2021-06-06 ENCOUNTER — Ambulatory Visit: Payer: Medicare HMO

## 2021-06-06 ENCOUNTER — Ambulatory Visit: Payer: Medicare HMO | Admitting: Cardiology

## 2021-06-06 ENCOUNTER — Encounter: Payer: Self-pay | Admitting: Cardiology

## 2021-06-06 ENCOUNTER — Other Ambulatory Visit: Payer: Self-pay

## 2021-06-06 VITALS — BP 124/76 | HR 78 | Temp 97.6°F | Resp 16 | Ht 64.0 in | Wt 152.8 lb

## 2021-06-06 DIAGNOSIS — Z01818 Encounter for other preprocedural examination: Secondary | ICD-10-CM

## 2021-06-06 NOTE — Progress Notes (Signed)
Date:  06/06/2021   ID:  Gwendlyn Deutscher, DOB [birth year], MRN [id number]  PCP:  Neale Burly, MD  Cardiologist:  Rex Kras, DO, [facility] (established care 06/06/2021)  REASON FOR CONSULT: Preoperative risk stratification  REQUESTING PHYSICIAN:  Neale Burly, MD [place],  [place] [id number]  Chief Complaint  Patient presents with   Pre-operative clearance   New Patient (Initial Visit)    HPI  Christine Douglas is a 61 y.o. Caucasian female who presents to the office with a chief complaint of " preop clearance." Patient's past medical history and cardiovascular risk factors include:  Anxiety, depression, insomnia.   She is referred to the office at the request of Hasanaj, Samul Dada, MD for evaluation of preoperative risk stratification.  Patient is here for preop clearance, patient states that she is scheduled for hernia surgery; however, she is pointing to the suprapubic region.  She also is unaware of the surgeon's name but states that has been be performed at [facility] surgical center.  We will have to: Get additional details with regards to the nature of the surgery, the type of anesthesia.  Date of surgery is currently planned for June 18, 2021.  She recently had labs at [facility] on 06/04/2021, records reviewed from [facility] and noted below for further reference.  She denies any angina pectoris or heart failure symptoms, no history of arrhythmias. Patient is prediabetic and no prior history of insulin-dependent diabetes. Her functional status remains relatively stable.  She walks 2 miles every other day.  Patient requesting an echocardiogram prior to her surgery as recommended by PCP.  Denies prior history of coronary artery disease, myocardial infarction, congestive heart failure, deep venous thrombosis, pulmonary embolism, stroke, transient ischemic attack.  No family history of premature coronary disease or sudden cardiac death.   ALLERGIES: Allergies  Allergen  Reactions   Anesthetics, Amide Anaphylaxis    Neck surgery pt unsure of exact anesthetic-afraid to have any further surgeries; Feels weird for a while   Other Swelling    "muscle relaxers" caused legs to swell   Flexeril [Cyclobenzaprine] Swelling    Cannot take muscle relaxers -- swelling of legs and body    Methocarbamol     MEDICATION LIST PRIOR TO VISIT: Current Meds  Medication Sig   buPROPion (WELLBUTRIN XL) 150 MG 24 hr tablet Take 3 tablets (450 mg total) by mouth daily.   diazepam (VALIUM) 10 MG tablet Take 1 tablet (10 mg total) by mouth every 12 (twelve) hours as needed for anxiety.     PAST MEDICAL HISTORY: Past Medical History:  Diagnosis Date   Allergy    Anxiety    Blood transfusion without reported diagnosis    x2   BV (bacterial vaginosis)    Depression    Headache    IBS (irritable bowel syndrome)    Neuromuscular disorder (HCC)    nerve damage C6  C7   PTSD (post-traumatic stress disorder)    Restless leg syndrome    Seasonal allergies    TO (tubal occlusion)     PAST SURGICAL HISTORY: Past Surgical History:  Procedure Laterality Date   ABDOMINAL HYSTERECTOMY     APPENDECTOMY     BACK SURGERY     spine surgery; 2 screws and plate in neck   CESAREAN SECTION     HERNIA MESH REMOVAL     LAPAROSCOPIC BILATERAL SALPINGO OOPHERECTOMY Bilateral 06/10/2017   Procedure: LAPAROSCOPIC BILATERAL OOPHORECTOMY;  Surgeon: Jonnie Kind, MD;  Location: [facility];  Service: Gynecology;  Laterality: Bilateral;   POSTERIOR CERVICAL FUSION/FORAMINOTOMY Left 02/05/2019   Procedure: Posterior cervical fusion with lateral mass fixation - Cervical seven-Thoracic one with left foraminotomy/ diskectomy;  Surgeon: [name], MD;  Location: [place];  Service: Neurosurgery;  Laterality: Left;   SHOULDER ARTHROSCOPY WITH ROTATOR CUFF REPAIR AND SUBACROMIAL DECOMPRESSION  06/04/2012   Procedure: SHOULDER ARTHROSCOPY WITH ROTATOR CUFF REPAIR AND SUBACROMIAL DECOMPRESSION;   Surgeon: Ninetta Lights, MD;  Location: [place];  Service: Orthopedics;  Laterality: Left;  LEFT ARTHROSCOPY SHOULDER DECOMPRESSION SUBACROMIAL PARTIAL ACROMIOPLASTY WITH CORACOACROMIAL RELEASE, DISTAL CLAVICULECTOMY,  ROTATOR CUFF REPAIR    TUBAL LIGATION      FAMILY HISTORY: The patient family history includes Bipolar disorder in her son; Cancer in an other family member; Dementia in her maternal aunt; Heart attack in her father; Hyperlipidemia in her mother; Other in her brother; Thyroid disease in her mother.  SOCIAL HISTORY:  The patient  reports that she has never smoked. She has never used smokeless tobacco. She reports current alcohol use. She reports that she does not use drugs.  REVIEW OF SYSTEMS: Review of Systems  Constitutional: Negative for chills and fever.  HENT:  Negative for hoarse voice and nosebleeds.   Eyes:  Negative for discharge, double vision and pain.  Cardiovascular:  Negative for chest pain, claudication, dyspnea on exertion, leg swelling, near-syncope, orthopnea, palpitations, paroxysmal nocturnal dyspnea and syncope.  Respiratory:  Negative for hemoptysis and shortness of breath.   Musculoskeletal:  Negative for muscle cramps and myalgias.  Gastrointestinal:  Negative for abdominal pain, constipation, diarrhea, hematemesis, hematochezia, melena, nausea and vomiting.  Neurological:  Negative for dizziness and light-headedness.   PHYSICAL EXAM: Vitals with BMI 06/06/2021 12/07/2020 12/07/2020  Height _0  - -  Weight 152 lbs 13 oz - -  BMI 23.55 - -  Systolic 732 202 99  Diastolic 76 71 64  Pulse 78 - -  Some encounter information is confidential and restricted. Go to Review Flowsheets activity to see all data.    CONSTITUTIONAL: Well-developed and well-nourished. No acute distress.  SKIN: Skin is warm and dry. No rash noted. No cyanosis. No pallor. No jaundice HEAD: Normocephalic and atraumatic.  EYES: No scleral icterus MOUTH/THROAT:  Moist oral membranes.  NECK: No JVD present. No thyromegaly noted. No carotid bruits  LYMPHATIC: No visible cervical adenopathy.  CHEST Normal respiratory effort. No intercostal retractions  LUNGS: Clear to auscultation bilaterally.  No stridor. No wheezes. No rales.  CARDIOVASCULAR: Regular rate and rhythm, positive S1-S2, no murmurs rubs or gallops appreciated. ABDOMINAL: Soft, nontender, nondistended, positive bowel sounds in all 4 quadrants, no apparent ascites.  EXTREMITIES: No peripheral edema, warm to touch, 2+ bilateral DP and PT pulses HEMATOLOGIC: No significant bruising NEUROLOGIC: Oriented to person, place, and time. Nonfocal. Normal muscle tone.  PSYCHIATRIC: Normal mood and affect. Normal behavior. Cooperative  CARDIAC DATABASE: EKG: 06/06/2021: NSR, 78 bpm, normal axis, without underlying injury pattern.  LABORATORY DATA: CBC Latest Ref Rng & Units 02/03/2019 09/05/2017 09/04/2017  WBC 4.0 - 10.5 K/uL 9.0 7.0 6.1  Hemoglobin 12.0 - 15.0 g/dL 13.5 11.9(L) 11.2(L)  Hematocrit 36.0 - 46.0 % 41.5 36.6 34.3(L)  Platelets 150 - 400 K/uL 289 243 191    CMP Latest Ref Rng & Units 02/03/2019 09/05/2017 09/04/2017  Glucose 70 - 99 mg/dL 89 100(H) 94  BUN 6 - 20 mg/dL _1 Creatinine 0.44 - 1.00 mg/dL 0.85 0.96 0.75  Sodium  135 - 145 mmol/L 142 140 142  Potassium 3.5 - 5.1 mmol/L 4.2 4.6 4.1  Chloride 98 - 111 mmol/L 108 103 110  CO2 22 - 32 mmol/L _0 Calcium 8.9 - 10.3 mg/dL 9.1 8.7(L) 8.4(L)  Total Protein 6.5 - 8.1 g/dL - - -  Total Bilirubin 0.3 - 1.2 mg/dL - - -  Alkaline Phos 38 - 126 U/L - - -  AST 15 - 41 U/L - - -  ALT 14 - 54 U/L - - -    Lipid Panel  No results found for: CHOL, TRIG, HDL, CHOLHDL, VLDL, LDLCALC, LDLDIRECT, LABVLDL  No components found for: NTPROBNP No results for input(s): PROBNP in the last 8760 hours. No results for input(s): TSH in the last 8760 hours.  BMP No results for input(s): NA, K, CL, CO2, GLUCOSE, BUN, CREATININE, CALCIUM,  GFRNONAA, GFRAA in the last 8760 hours.  HEMOGLOBIN A1C No results found for: HGBA1C, MPG  External Labs: Collected: 06/04/2021 available in Care Everywhere, [facility] TSH 2.55 Hemoglobin A1c 5.8 Total cholesterol 236, triglycerides 255, HDL 56, LDL 129, non-HDL 180 ( Non-fasting- per patient.) Sodium 139, potassium 4.8, chloride 103, bicarb 27.8, BUN 21, creatinine 1.1 eGFR 58 mL/min per 1.73 m WBC 14.1, platelets 327. Hemoglobin 14.5, hematocrit 44%   IMPRESSION:    ICD-10-CM   1. Pre-operative clearance  Z01.818 EKG 12-Lead    PCV ECHOCARDIOGRAM COMPLETE       RECOMMENDATIONS: Kailin Principato is a 61 y.o. Caucasian female whose past medical history and cardiac risk factors include: Anxiety, depression, insomnia.   Preop cardiovascular examination: Scheduled to have abdominal hernia repair per patient on 06/18/2021. Surgical provider unknown, per patient.  Will be performed at [facility] surgical center. Denies any chest pain, heart failure symptoms, and no prior history of valvular heart disease or dysrhythmias. No family history of premature CAD or sudden cardiac death. No prior history of CAD/MI, DVT/PE, CHF, stroke/TIA. EKG: Normal sinus without underlying injury pattern Appropriate functional status for age. Echocardiogram will be ordered to evaluate for structural heart disease and left ventricular systolic function. As long as the LVEF is preserved and no significant valvular heart disease she can proceed with surgery as acceptable risk.  Patient is agreeable with the plan of care and is thankful for the evaluation. Further recommendations to follow.  Of note, her lipid profile notes hypertriglyceridemia and hyperlipidemia.  However, patient states that these were performed when she was nonfasting.  I requested her to follow through with repeat fasting lipid profile with her PCP after her surgery.  Patient also has leukocytosis on her recent labs.  Patient states that it  could be related to her recent root canals and dental procedures.  She is currently on antibiotics and has 2 more days left.  I will defer additional work-up if warranted to her PCP at this time.    As part of this initial consultation reviewed outside records provided by PCP which included office note, labs from care everywhere and these findings have been summarized and noted above for further reference.  Discussed disease management, ordering diagnostic testing, coordination of care and patient education provided as a part of today's encounter.  FINAL MEDICATION LIST END OF ENCOUNTER: No orders of the defined types were placed in this encounter.   Medications Discontinued During This Encounter  Medication Reason   gabapentin (NEURONTIN) 300 MG capsule    ondansetron (ZOFRAN-ODT) 4 MG disintegrating tablet    oxyCODONE-acetaminophen (PERCOCET/ROXICET) 5-325 MG  tablet    NARCAN 4 MG/0.1ML LIQD nasal spray kit      Current Outpatient Medications:    buPROPion (WELLBUTRIN XL) 150 MG 24 hr tablet, Take 3 tablets (450 mg total) by mouth daily., Disp: 270 tablet, Rfl: 0   diazepam (VALIUM) 10 MG tablet, Take 1 tablet (10 mg total) by mouth every 12 (twelve) hours as needed for anxiety., Disp: 60 tablet, Rfl: 2  Orders Placed This Encounter  Procedures   EKG 12-Lead   PCV ECHOCARDIOGRAM COMPLETE    There are no Patient Instructions on file for this visit.   --Continue cardiac medications as reconciled in final medication list. --Return in about 3 months (around 09/03/2021), or if symptoms worsen or fail to improve, for Post-op follow up . Or sooner if needed. --Continue follow-up with your primary care physician regarding the management of your other chronic comorbid conditions.  Patient's questions and concerns were addressed to her satisfaction. She voices understanding of the instructions provided during this encounter.   This note was created using a voice recognition software as a  result there may be grammatical errors inadvertently enclosed that do not reflect the nature of this encounter. Every attempt is made to correct such errors.  Total time spent: 47 minutes.  Rex Kras, [place], [facility]  Pager: [phone] Office: [phone]

## 2021-06-08 ENCOUNTER — Telehealth: Payer: Self-pay

## 2021-06-08 NOTE — Telephone Encounter (Signed)
Pt needs echo/ekg results by Monday Faxed to [phone]

## 2021-06-08 NOTE — Telephone Encounter (Signed)
[facility] faxed it over.   ST

## 2021-06-11 ENCOUNTER — Telehealth: Payer: Self-pay

## 2021-07-04 ENCOUNTER — Telehealth (HOSPITAL_COMMUNITY): Payer: Self-pay | Admitting: *Deleted

## 2021-07-04 ENCOUNTER — Other Ambulatory Visit (HOSPITAL_COMMUNITY): Payer: Self-pay | Admitting: *Deleted

## 2021-07-04 DIAGNOSIS — F4310 Post-traumatic stress disorder, unspecified: Secondary | ICD-10-CM

## 2021-07-04 DIAGNOSIS — F330 Major depressive disorder, recurrent, mild: Secondary | ICD-10-CM

## 2021-07-04 DIAGNOSIS — F900 Attention-deficit hyperactivity disorder, predominantly inattentive type: Secondary | ICD-10-CM

## 2021-07-04 DIAGNOSIS — F5105 Insomnia due to other mental disorder: Secondary | ICD-10-CM

## 2021-07-04 MED ORDER — BUPROPION HCL ER (XL) 150 MG PO TB24: 450.0000 mg | ORAL_TABLET | Freq: Every day | ORAL | 0 refills | Status: DC

## 2021-07-04 MED ORDER — DIAZEPAM 10 MG PO TABS: 10.0000 mg | ORAL_TABLET | Freq: Two times a day (BID) | ORAL | 0 refills | Status: DC | PRN

## 2021-07-04 NOTE — Telephone Encounter (Signed)
Pt called requesting refills and an appointment with you tomorrow. Pt was no show for last appointment. Pt now ha an appointment scheduled for 07/12/21. Ok to send bridge of the Wellbutrin XL 150 mg and Valium 10 mg? ?

## 2021-07-12 ENCOUNTER — Telehealth (HOSPITAL_BASED_OUTPATIENT_CLINIC_OR_DEPARTMENT_OTHER): Payer: Medicare HMO | Admitting: Psychiatry

## 2021-07-12 ENCOUNTER — Other Ambulatory Visit: Payer: Self-pay

## 2021-07-12 DIAGNOSIS — F99 Mental disorder, not otherwise specified: Secondary | ICD-10-CM

## 2021-07-12 DIAGNOSIS — F900 Attention-deficit hyperactivity disorder, predominantly inattentive type: Secondary | ICD-10-CM | POA: Diagnosis not present

## 2021-07-12 DIAGNOSIS — F330 Major depressive disorder, recurrent, mild: Secondary | ICD-10-CM | POA: Diagnosis not present

## 2021-07-12 DIAGNOSIS — F4310 Post-traumatic stress disorder, unspecified: Secondary | ICD-10-CM | POA: Diagnosis not present

## 2021-07-12 DIAGNOSIS — F5105 Insomnia due to other mental disorder: Secondary | ICD-10-CM

## 2021-07-12 MED ORDER — BUPROPION HCL ER (XL) 150 MG PO TB24: 450.0000 mg | ORAL_TABLET | Freq: Every day | ORAL | 0 refills | Status: DC

## 2021-07-12 MED ORDER — DIAZEPAM 10 MG PO TABS: 10.0000 mg | ORAL_TABLET | Freq: Two times a day (BID) | ORAL | 2 refills | Status: DC | PRN

## 2021-07-12 NOTE — Progress Notes (Signed)
Virtual Visit via Video Note ? ?I connected with Christine Douglas on 07/12/21 at 11:15 AM EST by a video enabled telemedicine application and verified that I am speaking with the correct person using two identifiers. ? ?Location: ?Patient: home ?Provider: office ?  ?I discussed the limitations of evaluation and management by telemedicine and the availability of in person appointments. The patient expressed understanding and agreed to proceed. ? ?History of Present Illness: ?"I am doing good". 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 It ends in May and then there is an advanced class she is thinking of taking. Christine Douglas is glad to have something to do and knows it is good for her. Christine Douglas had trouble updating [facility] regarding her new phone number and missed an appointment because of it. Christine Douglas has noticed a lot of anxiety right before to going classes in the evening and thinks it is might be due to HV from her PTSD. [name] states they are the only ones on campus at that time of night.. She is taking Valium twice a day and she can tell when it wears off. She only takes it in the morning and then again at bedtime. Christine Douglas has ongoing, frequent intrusive memories. She has been challenging herself and is now in a relationship with a nice man.  Her depressive episodes are less often but still occur 2-3 days a month. She still feels sad about her mom being in a NH. When not busy she gets upset because she thinks about things too much. She doesn't see her kids or grand kids anymore because they don't contact her. Her sleep is poor and she wakes up multiple times a night. She denies SI/HI.  ?  ?Observations/Objective: ?Psychiatric Specialty Exam: ?ROS  ?There were no vitals taken for this visit.There is no height or weight on file to calculate BMI.  ?General Appearance: Casual and Neat  ?Eye Contact:  Good  ?Speech:  Clear and Coherent and Normal Rate  ?Volume:  Normal  ?Mood:  Euthymic  ?Affect:  Full Range   ?Thought Process:  Coherent and Descriptions of Associations: Circumstantial  ?Orientation:  Full (Time, Place, and Person)  ?Thought Content:  Logical  ?Suicidal Thoughts:  No  ?Homicidal Thoughts:  No  ?Memory:  Immediate;   Good  ?Judgement:  Good  ?Insight:  Good  ?Psychomotor Activity:  Normal  ?Concentration:  Concentration: Good  ?Recall:  Good  ?Fund of Knowledge:  Good  ?Language:  Good  ?Akathisia:  No  ?Handed:  Right  ?AIMS (if indicated):     ?Assets:  Communication Skills ?Desire for Improvement ?Financial Resources/Insurance ?Housing ?Intimacy ?Leisure Time ?Resilience ?Social Support ?Talents/Skills ?Transportation ?Vocational/Educational  ?ADL's:  Intact  ?Cognition:  WNL  ?Sleep:     ? ? ? ?Assessment and Plan: ?Depression screen [facility] 2/9 07/12/2021 12/14/2020 09/14/2020 06/22/2020 11/01/2015  ?Decreased Interest 0 0 2 0 3  ?Down, Depressed, Hopeless 1 2 1  0 3  ?PHQ - 2 Score 1 2 3  0 6  ?Altered sleeping - 3 0 - 3  ?Tired, decreased energy - 1 3 - 3  ?Change in appetite - 1 2 - 3  ?Feeling bad or failure about yourself  - 0 1 - 2  ?Trouble concentrating - 3 3 - 3  ?Moving slowly or fidgety/restless - 0 0 - 2  ?Suicidal thoughts - 0 0 - 0  ?PHQ-9 Score - 10 12 - 22  ?Difficult doing work/chores - Somewhat difficult  Very difficult - Extremely dIfficult  ?Some recent data might be hidden  ? ? ?Flowsheet Row Video Visit [facility] 07/12/2021 in [facility] Video Visit from 12/14/2020 in [facility] Video Visit from 09/14/2020 in [facility]  ?C-SSRS RISK CATEGORY No Risk No Risk No Risk  ? ?  ? ?- reviewed sleep hygiene ? ?1. MDD (major depressive disorder), recurrent episode, mild (HCC) ?- buPROPion (WELLBUTRIN XL) 150 MG 24 hr tablet; Take 3 tablets (450 mg total) by mouth daily.  Dispense: 270 tablet; Refill: 0 ? ?2. Attention deficit hyperactivity disorder (ADHD), predominantly inattentive  type ?- buPROPion (WELLBUTRIN XL) 150 MG 24 hr tablet; Take 3 tablets (450 mg total) by mouth daily.  Dispense: 270 tablet; Refill: 0 ? ?3. PTSD (post-traumatic stress disorder) ?- diazepam (VALIUM) 10 MG tablet; Take 1 tablet (10 mg total) by mouth every 12 (twelve) hours as needed for anxiety.  Dispense: 60 tablet; Refill: 2 ? 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  Dispense: 60 tablet; Refill: 2 ? ? ? ?Follow Up Instructions: ?In 2-3 months or sooner if needed ?  ?I discussed the assessment and treatment plan with the patient. The patient was provided an opportunity to ask questions and all were answered. The patient agreed with the plan and demonstrated an understanding of the instructions. ?  ?The patient was advised to call back or seek an in-person evaluation if the symptoms worsen or if the condition fails to improve as anticipated. ? ?I provided 21 minutes of non-face-to-face time during this encounter. ? ? ?Oletta Darter, MD ? ?

## 2021-08-08 ENCOUNTER — Other Ambulatory Visit (HOSPITAL_COMMUNITY): Payer: Self-pay | Admitting: *Deleted

## 2021-08-08 DIAGNOSIS — F900 Attention-deficit hyperactivity disorder, predominantly inattentive type: Secondary | ICD-10-CM

## 2021-08-08 DIAGNOSIS — F330 Major depressive disorder, recurrent, mild: Secondary | ICD-10-CM

## 2021-08-08 MED ORDER — BUPROPION HCL ER (XL) 300 MG PO TB24: 300.0000 mg | ORAL_TABLET | ORAL | 0 refills | Status: DC

## 2021-08-08 MED ORDER — BUPROPION HCL ER (XL) 150 MG PO TB24: 150.0000 mg | ORAL_TABLET | Freq: Every day | ORAL | 0 refills | Status: DC

## 2021-08-28 NOTE — Telephone Encounter (Signed)
error 

## 2021-09-04 ENCOUNTER — Ambulatory Visit: Payer: Medicare HMO | Admitting: Cardiology

## 2021-09-20 IMAGING — DX DG SHOULDER 2+V*R*
3 series · 3 of 3 positions shown · non-contrast
Comparison: None.

CLINICAL DATA: Fell, right shoulder pain

EXAM:
RIGHT SHOULDER - 2+ VIEW

[shoulder grashey]
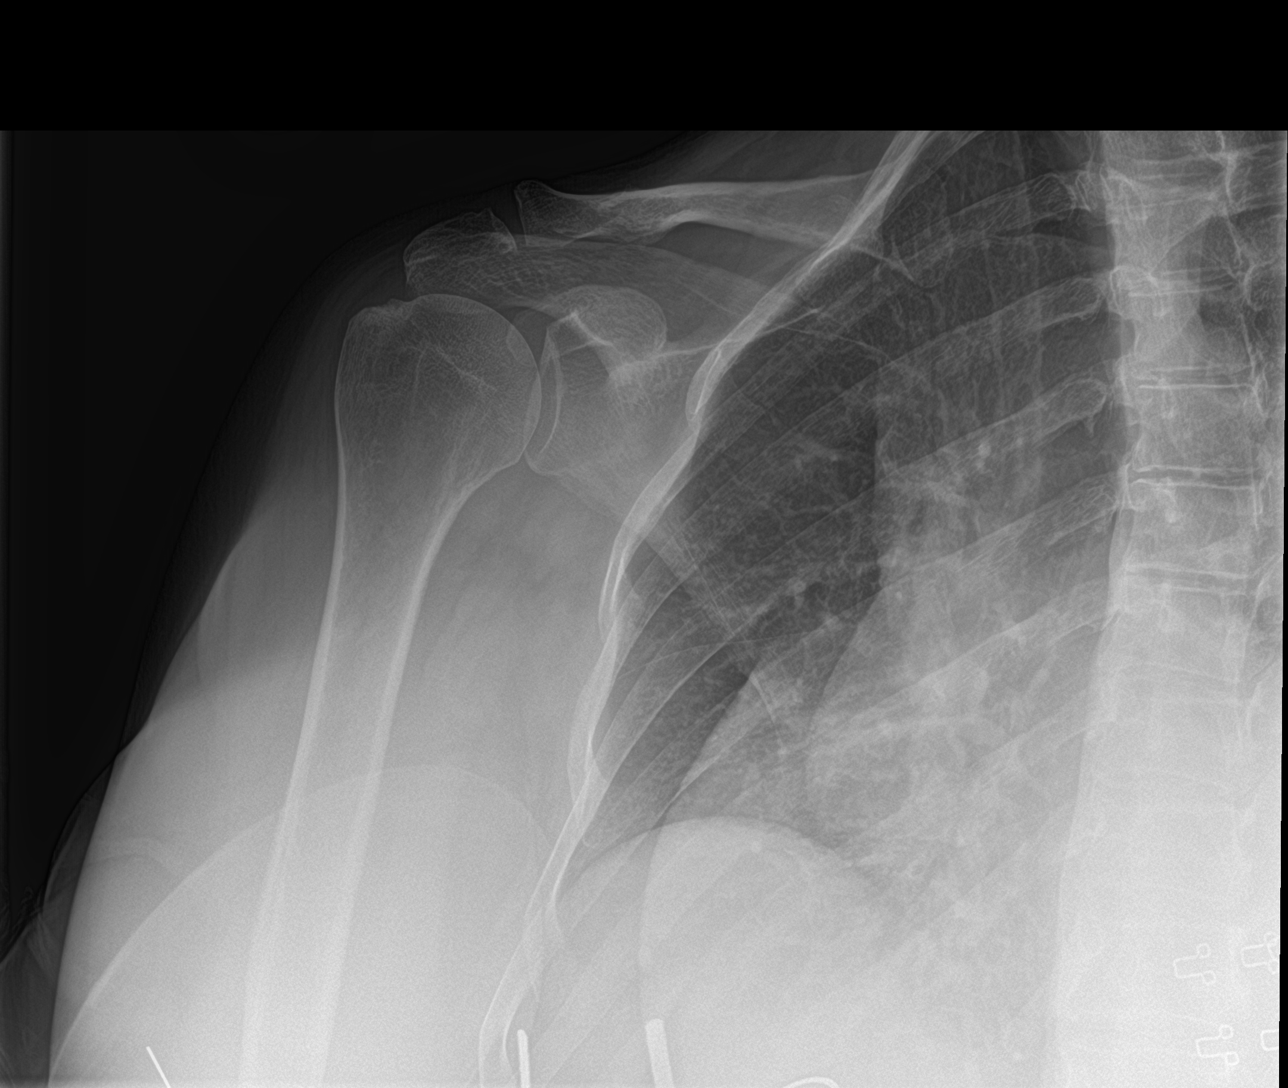

[shoulder y view]
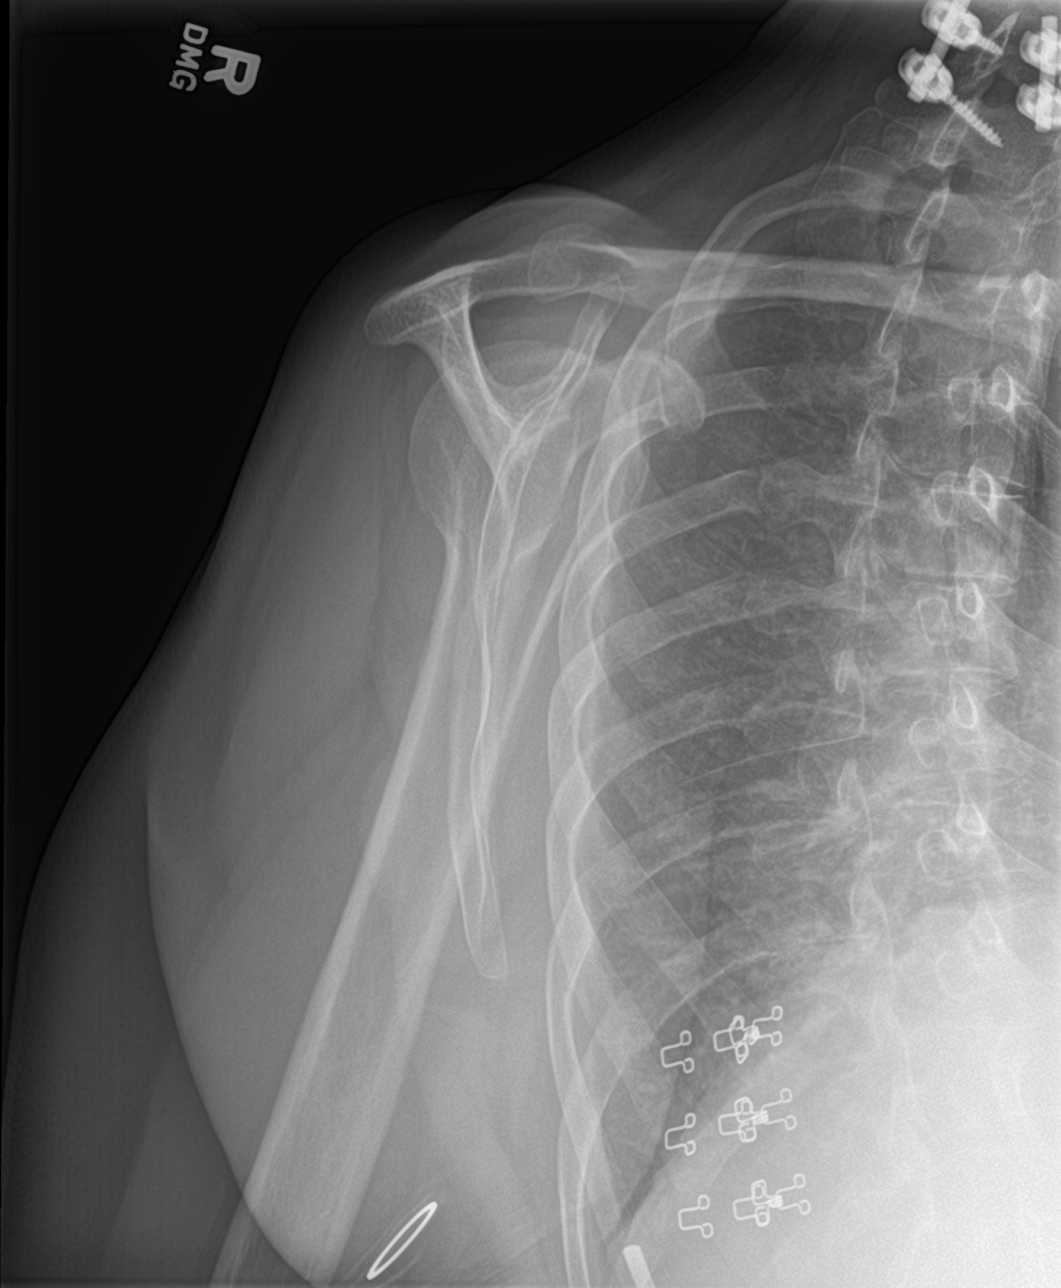

[shoulder axillary]
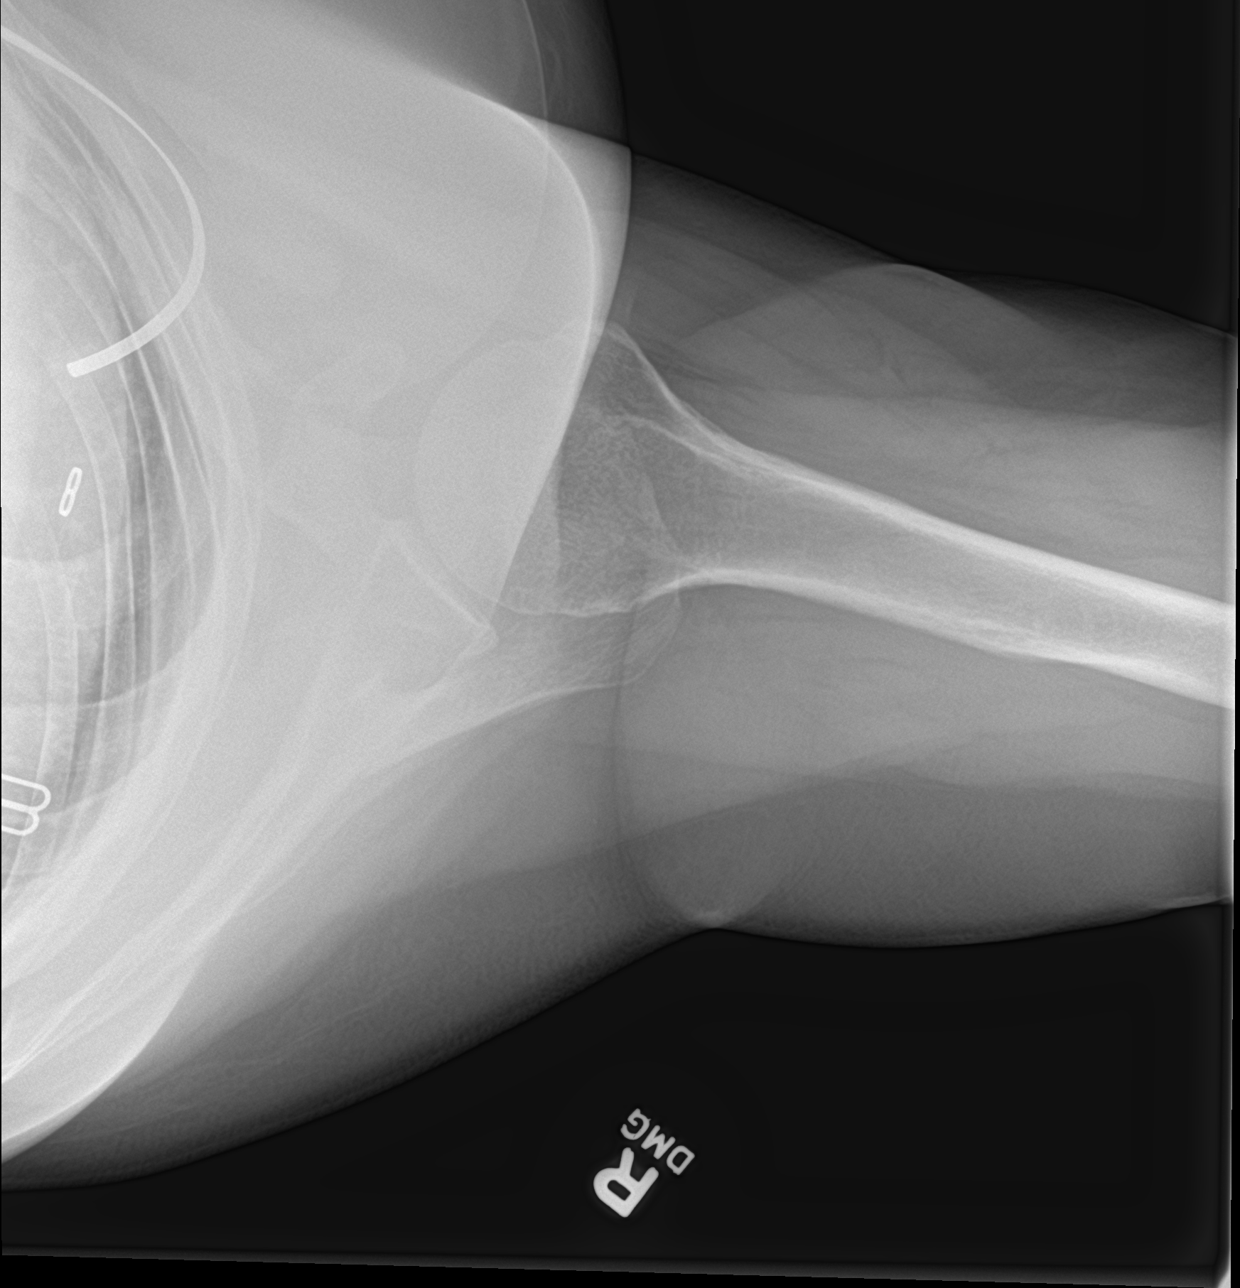

[3 of 3 positions shown; findings below may reference images not displayed]

FINDINGS: Frontal, transscapular, and axillary views of the right shoulder are
obtained. No fracture, subluxation, or dislocation. Joint spaces are
well preserved. Right chest is clear.
IMPRESSION: 1. Unremarkable right shoulder.

## 2021-10-04 ENCOUNTER — Telehealth (HOSPITAL_BASED_OUTPATIENT_CLINIC_OR_DEPARTMENT_OTHER): Payer: Medicare HMO | Admitting: Psychiatry

## 2021-10-04 DIAGNOSIS — F5105 Insomnia due to other mental disorder: Secondary | ICD-10-CM

## 2021-10-04 DIAGNOSIS — F99 Mental disorder, not otherwise specified: Secondary | ICD-10-CM

## 2021-10-04 DIAGNOSIS — F330 Major depressive disorder, recurrent, mild: Secondary | ICD-10-CM

## 2021-10-04 DIAGNOSIS — F4310 Post-traumatic stress disorder, unspecified: Secondary | ICD-10-CM | POA: Diagnosis not present

## 2021-10-04 DIAGNOSIS — F900 Attention-deficit hyperactivity disorder, predominantly inattentive type: Secondary | ICD-10-CM | POA: Diagnosis not present

## 2021-10-04 MED ORDER — BUPROPION HCL ER (XL) 300 MG PO TB24: 300.0000 mg | ORAL_TABLET | ORAL | 0 refills | Status: DC

## 2021-10-04 MED ORDER — BUPROPION HCL ER (XL) 150 MG PO TB24
150.0000 mg | ORAL_TABLET | Freq: Every day | ORAL | 0 refills | Status: DC
Start: 2021-10-04 — End: 2021-11-15

## 2021-10-04 MED ORDER — DIAZEPAM 10 MG PO TABS: 10.0000 mg | ORAL_TABLET | Freq: Two times a day (BID) | ORAL | 2 refills | Status: DC | PRN

## 2021-10-04 NOTE — Progress Notes (Signed)
Virtual Visit via Video Note  I connected with Christine Douglas on 10/04/21 at 10:00 AM EDT by a video enabled telemedicine application and verified that I am speaking with the correct person using two identifiers.  Location: Patient: home Provider: office   I discussed the limitations of evaluation and management by telemedicine and the availability of in person appointments. The patient expressed understanding and agreed to proceed.  History of Present Illness: Christine Douglas is doing ok. Her birthday was last Saturday and it made her feel down. She laid in bed for a couple of days. Christine Douglas was feeling lonely but is better now. About 2 nights a week she has broken sleep. On those nights she is unable to stop her mind from racing. Christine Douglas ends up napping the next day. Her mood improved about 5 days ago. Christine Douglas has graduated from college and is going on a 5 day cruise with her daughter next week. She is looking forward to it but is scared of the weather. Her PTSD comes and goes. She doesn't like to be around a lot of people and actively avoids places like stores. Her hypervigilance is high when she is alone. When she goes in she ends of roaming around and leaves with nothing. Christine Douglas is taking Valium in the morning and at night. If she doesn't take it then she gets big knots in her shoulders. Valium does calm her a little. She doesn't want to be home bound. Christine Douglas denies SI/HI. 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   Observations/Objective: Psychiatric Specialty Exam: ROS  There were no vitals taken for this visit.There is no height or weight on file to calculate BMI.  General Appearance: Neat and Well Groomed  Eye Contact:  Good  Speech:  Clear and Coherent and Normal Rate  Volume:  Normal  Mood:  Euthymic  Affect:  Full Range  Thought Process:  Coherent and Descriptions of Associations: Circumstantial  Orientation:  Full (Time, Place, and Person)  Thought Content:   Logical  Suicidal Thoughts:  No  Homicidal Thoughts:  No  Memory:  Immediate;   Good  Judgement:  Good  Insight:  Good  Psychomotor Activity:  Normal  Concentration:  Concentration: Good  Recall:  Good  Fund of Knowledge:  Good  Language:  Good  Akathisia:  No  Handed:  Right  AIMS (if indicated):     Assets:  Communication Skills Desire for Improvement Financial Resources/Insurance Housing Intimacy Leisure Time Resilience Social Support Talents/Skills Transportation Vocational/Educational  ADL's:  Intact  Cognition:  WNL  Sleep:        Assessment and Plan:     10/04/2021   10:38 AM 07/12/2021   11:33 AM 12/14/2020    9:55 AM 09/14/2020    2:19 PM 06/22/2020    3:18 PM  Depression screen PHQ 2/9  Decreased Interest 0 0 0 2 0  Down, Depressed, Hopeless 1 1 2 1  0  PHQ - 2 Score 1 1 2 3  0  Altered sleeping   3 0   Tired, decreased energy   1 3   Change in appetite   1 2   Feeling bad or failure about yourself    0 1   Trouble concentrating   3 3   Moving slowly or fidgety/restless   0 0   Suicidal thoughts   0 0   PHQ-9 Score   10 12   Difficult doing work/chores  Somewhat difficult Very difficult     Flowsheet Row Video Visit from 10/04/2021 in [facility] Video Visit from 07/12/2021 in [facility] Video Visit from 12/14/2020 in [place] No Risk No Risk No Risk        Status of current problems: ongoing anxiety symptoms  Meds:  Christine Douglas feels Wellbutrin is effective but if she takes it later in the day then she has trouble sleeping.   Christine Douglas has had trouble with med trials so we will wait to see how things are once she starts working. Having a routine could help her mood.   1. MDD (major depressive disorder), recurrent episode, mild (HCC) - buPROPion (WELLBUTRIN XL) 150 MG 24 hr tablet; Take 1 tablet (150 mg total) by mouth  daily.  Dispense: 90 tablet; Refill: 0  2. Attention deficit hyperactivity disorder (ADHD), predominantly inattentive type - buPROPion (WELLBUTRIN XL) 150 MG 24 hr tablet; Take 1 tablet (150 mg total) by mouth daily.  Dispense: 90 tablet; Refill: 0  3. PTSD (post-traumatic stress disorder) - diazepam (VALIUM) 10 MG tablet; Take 1 tablet (10 mg total) by mouth every 12 (twelve) hours as needed for anxiety.  Dispense: 60 tablet; Refill: 2  4. Insomnia due to other mental disorder - diazepam (VALIUM) 10 MG tablet; Take 1 tablet (10 mg total) by mouth every 12 (twelve) hours as needed for anxiety.  Dispense: 60 tablet; Refill: 2    Therapy: brief supportive therapy provided. Discussed psychosocial stressors in detail.   Encouraged pt to develop daily routine and work on daily goal setting as a way to improve mood symptoms.    Collaboration of Care: Other none today  Patient/Guardian was advised Release of Information must be obtained prior to any record release in order to collaborate their care with an outside provider. Patient/Guardian was advised if they have not already done so to contact the registration department to sign all necessary forms in order for [place] to release information regarding their care.   Consent: Patient/Guardian gives verbal consent for treatment and assignment of benefits for services provided during this visit. Patient/Guardian expressed understanding and agreed to proceed.    Follow Up Instructions: Follow up in 2-3 months or sooner if needed    I discussed the assessment and treatment plan with the patient. The patient was provided an opportunity to ask questions and all were answered. The patient agreed with the plan and demonstrated an understanding of the instructions.   The patient was advised to call back or seek an in-person evaluation if the symptoms worsen or if the condition fails to improve as anticipated.  I provided 19 minutes of non-face-to-face  time during this encounter.   Charlcie Cradle, MD

## 2021-11-15 ENCOUNTER — Telehealth (HOSPITAL_BASED_OUTPATIENT_CLINIC_OR_DEPARTMENT_OTHER): Payer: Medicare HMO | Admitting: Psychiatry

## 2021-11-15 DIAGNOSIS — F5105 Insomnia due to other mental disorder: Secondary | ICD-10-CM | POA: Diagnosis not present

## 2021-11-15 DIAGNOSIS — F900 Attention-deficit hyperactivity disorder, predominantly inattentive type: Secondary | ICD-10-CM | POA: Diagnosis not present

## 2021-11-15 DIAGNOSIS — F4310 Post-traumatic stress disorder, unspecified: Secondary | ICD-10-CM

## 2021-11-15 DIAGNOSIS — F330 Major depressive disorder, recurrent, mild: Secondary | ICD-10-CM

## 2021-11-15 DIAGNOSIS — F99 Mental disorder, not otherwise specified: Secondary | ICD-10-CM

## 2021-11-15 MED ORDER — BUPROPION HCL ER (XL) 150 MG PO TB24: 150.0000 mg | ORAL_TABLET | Freq: Every day | ORAL | 0 refills | Status: DC

## 2021-11-15 MED ORDER — LAMOTRIGINE 25 MG PO TABS: ORAL_TABLET | ORAL | 0 refills | Status: DC

## 2021-11-15 MED ORDER — DIAZEPAM 10 MG PO TABS: 10.0000 mg | ORAL_TABLET | Freq: Two times a day (BID) | ORAL | 0 refills | Status: DC | PRN

## 2021-11-15 MED ORDER — BUPROPION HCL ER (XL) 300 MG PO TB24: 300.0000 mg | ORAL_TABLET | ORAL | 0 refills | Status: DC

## 2021-11-15 NOTE — Progress Notes (Signed)
Virtual Visit via Phone Note  I connected with Christine Douglas on 11/15/21 at  9:30 AM EDT by phone. We attempted to connect by a video enabled telemedicine application many times but were unsuccessful. I verified that I am speaking with the correct person using two identifiers.  Location: Patient: home Provider: office   I discussed the limitations of evaluation and management by telemedicine and the availability of in person appointments. The patient expressed understanding and agreed to proceed.  History of Present Illness: Christine Douglas is feeling overwhelmed. She is in school for becoming a Statistician. The class she is in now is not really teaching her anything because the teacher is tired. The teacher is not very helpful. Her ADHD is stable with Wellbutrin. She feels it is working and she is able to concentrate. Christine Douglas is really frustrated with this class. 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 She has to make herself do things at times.  Christine Douglas shares that her mom has been really mean to her lately. She has stopped visited her mom as often as a result of the way she is treating Christine Douglas. Christine Douglas has called her mom repeatedly and her mom never answered. When Christine Douglas went to see her mom was mad and very mean. Christine Douglas is trying not to get depressed but admits she has gets down 1 day a week. On those days she doesn't go out or do anything. She denies SI/HI. Her sleep is poor- it takes a while to fall asleep and then her sleep is broken. Christine Douglas takes a nap most days.  Her PTSD symptoms are unchanged. She has on/off nightmares but the last one was several weeks ago. She denies SI/HI. Her anxiety is high. She has been thinking about the future and if she will be able to achieve her goal. The Valium does help to calm her.    Observations/Objective: Psychiatric Specialty Exam:  General Appearance: unable to assess  Eye Contact:  unable to assess   Speech:  Clear and Coherent and Normal Rate  Volume:  Normal  Mood:  Anxious and Depressed  Affect:  Congruent  Thought Process:  Coherent and Descriptions of Associations: Circumstantial  Orientation:  Full (Time, Place, and Person)  Thought Content:  Rumination  Suicidal Thoughts:  No  Homicidal Thoughts:  No  Memory:  Immediate;   Good  Judgement:  Good  Insight:  Good  Psychomotor Activity: unable to assess  Concentration:  Concentration: Good  Recall:  Good  Fund of Knowledge:  Good  Language:  Good  Akathisia:  unable to assess  Handed:  unable to assess  AIMS (if indicated):     Assets:  Communication Skills Desire for Improvement Financial Resources/Insurance Housing Intimacy Leisure Time Resilience Social Support Talents/Skills Transportation Vocational/Educational  ADL's:  unable to assess  Cognition:  WNL  Sleep:         Assessment and Plan:     11/15/2021    9:45 AM 10/04/2021   10:38 AM 07/12/2021   11:33 AM 12/14/2020    9:55 AM 09/14/2020    2:19 PM  Depression screen PHQ 2/9  Decreased Interest 0 0 0 0 2  Down, Depressed, Hopeless 1 1 1 2 1   PHQ - 2 Score 1 1 1 2 3   Altered sleeping    3 0  Tired, decreased energy    1 3  Change in appetite    1 2  Feeling  bad or failure about yourself     0 1  Trouble concentrating    3 3  Moving slowly or fidgety/restless    0 0  Suicidal thoughts    0 0  PHQ-9 Score    10 12  Difficult doing work/chores    Somewhat difficult Very difficult    Flowsheet Row [facility]15/2021 in [facility] Video Visit from 10[facility]04/2021 in [facility] Video Visit from 07[facility]12/2021 in [facility]  C-SSRS RISK CATEGORY No Risk No Risk No Risk         Status of current problems: worsening anxiety, depression  Meds: start trial of Lamitcal for mood  1. MDD (major depressive disorder), recurrent  episode, mild (HCC) - lamoTRIgine (LAMICTAL) 25 MG tablet; Take 1 tablet (25 mg total) by mouth daily for 14 days, THEN 1 tablet (25 mg total) 2 (two) times daily.  Dispense: 74 tablet; Refill: 0 - buPROPion (WELLBUTRIN XL) 150 MG 24 hr tablet; Take 1 tablet (150 mg total) by mouth daily.  Dispense: 90 tablet; Refill: 0 - buPROPion (WELLBUTRIN XL) 300 MG 24 hr tablet; Take 1 tablet (300 mg total) by mouth every morning.  Dispense: 90 tablet; Refill: 0  2. Attention deficit hyperactivity disorder (ADHD), predominantly inattentive type - buPROPion (WELLBUTRIN XL) 150 MG 24 hr tablet; Take 1 tablet (150 mg total) by mouth daily.  Dispense: 90 tablet; Refill: 0 - buPROPion (WELLBUTRIN XL) 300 MG 24 hr tablet; Take 1 tablet (300 mg total) by mouth every morning.  Dispense: 90 tablet; Refill: 0  3. PTSD (post-traumatic stress disorder) - diazepam (VALIUM) 10 MG tablet; Take 1 tablet (10 mg total) by mouth every 12 (twelve) hours as needed for anxiety.  Dispense: 60 tablet; Refill: 0  4. Insomnia due to other mental disorder - diazepam (VALIUM) 10 MG tablet; Take 1 tablet (10 mg total) by mouth every 12 (twelve) hours as needed for anxiety.  Dispense: 60 tablet; Refill: 0     Labs: none    Therapy: brief supportive therapy provided. Discussed psychosocial stressors in detail.     Collaboration of Care: Other none  Patient/Guardian was advised Release of Information must be obtained prior to any record release in order to collaborate their care with an outside provider. Patient/Guardian was advised if they have not already done so to contact the registration department to sign all necessary forms in order for [place] to release information regarding their care.   Consent: Patient/Guardian gives verbal consent for treatment and assignment of benefits for services provided during this visit. Patient/Guardian expressed understanding and agreed to proceed.      Follow Up Instructions: Follow up in 2  weeks or sooner if needed    I discussed the assessment and treatment plan with the patient. The patient was provided an opportunity to ask questions and all were answered. The patient agreed with the plan and demonstrated an understanding of the instructions.   The patient was advised to call back or seek an in-person evaluation if the symptoms worsen or if the condition fails to improve as anticipated.  I provided 24 minutes of non-face-to-face time during this encounter.   Oletta Darter, MD

## 2021-11-29 ENCOUNTER — Telehealth (HOSPITAL_BASED_OUTPATIENT_CLINIC_OR_DEPARTMENT_OTHER): Payer: Medicare HMO | Admitting: Psychiatry

## 2021-11-29 DIAGNOSIS — F330 Major depressive disorder, recurrent, mild: Secondary | ICD-10-CM

## 2021-11-29 MED ORDER — LAMOTRIGINE 25 MG PO TABS: 50.0000 mg | ORAL_TABLET | Freq: Every day | ORAL | 0 refills | Status: DC

## 2021-11-29 NOTE — Progress Notes (Signed)
Virtual Visit via video Note  I connected with Christine Douglas on 11/29/21 at  1:45 PM EDT by a video enabled telemedicine application and verified that I am speaking with the correct person using two identifiers.  Location: Patient: home Provider: office   I discussed the limitations of evaluation and management by telemedicine and the availability of in person appointments. The patient expressed understanding and agreed to proceed.  History of Present Illness: Christine Douglas has been taking Lamictal for 2 weeks now. It is making her a little tired but it seems to be helping. She is not as depressed and is feeling better overall. Christine Douglas is not having as many mood swings and feels calmer overall. She denies any manic or hypomanic like symptoms. Christine Douglas is more motivated and getting several things done.  She denies SI/HI. Christine Douglas wants to continue it.    Observations/Objective: Psychiatric Specialty Exam: ROS  There were no vitals taken for this visit.There is no height or weight on file to calculate BMI.  General Appearance: Fairly Groomed and Neat  Eye Contact:  Good  Speech:  Clear and Coherent and Normal Rate  Volume:  Normal  Mood:  Depressed  Affect:  Full Range  Thought Process:  Goal Directed, Linear, and Descriptions of Associations: Intact  Orientation:  Full (Time, Place, and Person)  Thought Content:  Logical  Suicidal Thoughts:  No  Homicidal Thoughts:  No  Memory:  Immediate;   Good  Judgement:  Good  Insight:  Good  Psychomotor Activity:  Normal  Concentration:  Concentration: Good  Recall:  Good  Fund of Knowledge:  Good  Language:  Good  Akathisia:  No  Handed:  Right  AIMS (if indicated):     Assets:  Communication Skills Desire for Improvement Financial Resources/Insurance Housing Intimacy Resilience Social Support Talents/Skills Transportation Vocational/Educational  ADL's:  Intact  Cognition:  WNL  Sleep:        Assessment and Plan:     11/15/2021    9:45 AM  10/04/2021   10:38 AM 07/12/2021   11:33 AM 12/14/2020    9:55 AM 09/14/2020    2:19 PM  Depression screen PHQ 2/9  Decreased Interest 0 0 0 0 2  Down, Depressed, Hopeless 1 1 1 2 1   PHQ - 2 Score 1 1 1 2 3   Altered sleeping    3 0  Tired, decreased energy    1 3  Change in appetite    1 2  Feeling bad or failure about yourself     0 1  Trouble concentrating    3 3  Moving slowly or fidgety/restless    0 0  Suicidal thoughts    0 0  PHQ-9 Score    10 12  Difficult doing work/chores    Somewhat difficult Very difficult    Flowsheet Row Video Visit from 11/15/2021 in [facility] Video Visit from 10/04/2021 in [facility] Video Visit from 07/12/2021 in [facility]  C-SSRS RISK CATEGORY No Risk No Risk No Risk          Status of current problems: tolerating Lamictal without SE. She wants to continue it  Meds:  1. MDD (major depressive disorder), recurrent episode, mild (HCC) - lamoTRIgine (LAMICTAL) 25 MG tablet; Take 2 tablets (50 mg total) by mouth daily.  Dispense: 180 tablet; Refill: 0     Labs: none    Therapy: brief supportive therapy provided.  Collaboration of Care: Other none  Patient/Guardian was advised Release of Information must be obtained prior to any record release in order to collaborate their care with an outside provider. Patient/Guardian was advised if they have not already done so to contact the registration department to sign all necessary forms in order for [place] to release information regarding their care.   Consent: Patient/Guardian gives verbal consent for treatment and assignment of benefits for services provided during this visit. Patient/Guardian expressed understanding and agreed to proceed.       Follow Up Instructions: Follow up in 2-3 months or sooner if needed    I discussed the assessment and treatment plan with the  patient. The patient was provided an opportunity to ask questions and all were answered. The patient agreed with the plan and demonstrated an understanding of the instructions.   The patient was advised to call back or seek an in-person evaluation if the symptoms worsen or if the condition fails to improve as anticipated.  I provided 11 minutes of non-face-to-face time during this encounter.   Oletta Darter, MD

## 2022-01-31 ENCOUNTER — Telehealth (HOSPITAL_COMMUNITY): Payer: Medicare HMO | Admitting: Psychiatry

## 2022-02-07 ENCOUNTER — Telehealth (HOSPITAL_BASED_OUTPATIENT_CLINIC_OR_DEPARTMENT_OTHER): Payer: Medicare HMO | Admitting: Psychiatry

## 2022-02-07 DIAGNOSIS — F99 Mental disorder, not otherwise specified: Secondary | ICD-10-CM

## 2022-02-07 DIAGNOSIS — F5105 Insomnia due to other mental disorder: Secondary | ICD-10-CM

## 2022-02-07 DIAGNOSIS — F330 Major depressive disorder, recurrent, mild: Secondary | ICD-10-CM

## 2022-02-07 DIAGNOSIS — F4310 Post-traumatic stress disorder, unspecified: Secondary | ICD-10-CM

## 2022-02-07 DIAGNOSIS — F900 Attention-deficit hyperactivity disorder, predominantly inattentive type: Secondary | ICD-10-CM | POA: Diagnosis not present

## 2022-02-07 MED ORDER — DIAZEPAM 10 MG PO TABS
10.0000 mg | ORAL_TABLET | Freq: Two times a day (BID) | ORAL | 1 refills | Status: DC | PRN
Start: 2022-02-07 — End: 2022-04-11

## 2022-02-07 MED ORDER — BUPROPION HCL ER (XL) 150 MG PO TB24: 150.0000 mg | ORAL_TABLET | Freq: Every day | ORAL | 0 refills | Status: DC

## 2022-02-07 NOTE — Progress Notes (Signed)
Virtual Visit via Video Note  I connected with Christine Douglas on 02/07/22 at 10:45 AM EDT by a video enabled telemedicine application and verified that I am speaking with the correct person using two identifiers.  Location: Patient: work Provider: office   I discussed the limitations of evaluation and management by telemedicine and the availability of in person appointments. The patient expressed understanding and agreed to proceed.  History of Present Illness: Christine Douglas is doing well. She is   working on her Engineer, maintenance business.  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 She wants to get disability. Her anxiety is "ok". Her relationship is a little stressful due to his own issues.  They are having some issues with their house. Christine Douglas is focusing in herself right now. Her PTSD is stable. Her sleep has improved since she decreased her Wellbutrin 150mg . Her anxiety seemed to decrease once she decreased the Wellbutrin to 150mg .  Christine Douglas is sleeping about 6-7 hrs/night. Her mood is stable. She admits to feeling depressed a little everyday but it is not overwhelming. She denies anhedonia and hopelessness. She denies SI/HI. She is not napping. Her energy and appetite are good. Her focus is good. She denies negative self thoughts. She is taking Valium at bedtime.    Observations/Objective: Psychiatric Specialty Exam: ROS  There were no vitals taken for this visit.There is no height or weight on file to calculate BMI.  General Appearance: Casual and Fairly Groomed  Eye Contact:  Good  Speech:  Clear and Coherent and Normal Rate  Volume:  Normal  Mood:  Depressed  Affect:  Full Range  Thought Process:  Goal Directed, Linear, and Descriptions of Associations: Intact  Orientation:  Full (Time, Place, and Person)  Thought Content:  Logical  Suicidal Thoughts:  No  Homicidal Thoughts:  No  Memory:  Immediate;   Good  Judgement:  Good  Insight:  Good  Psychomotor Activity:  Normal   Concentration:  Concentration: Good  Recall:  Good  Fund of Knowledge:  Good  Language:  Good  Akathisia:  No  Handed:  Right  AIMS (if indicated):     Assets:  Communication Skills Desire for Improvement Financial Resources/Insurance Housing Intimacy Leisure Time Resilience Social Support Talents/Skills Transportation Vocational/Educational  ADL's:  Intact  Cognition:  WNL  Sleep:        Assessment and Plan:     02/07/2022   11:46 AM 11/15/2021    9:45 AM 10/04/2021   10:38 AM 07/12/2021   11:33 AM 12/14/2020    9:55 AM  Depression screen PHQ 2/9  Decreased Interest 0 0 0 0 0  Down, Depressed, Hopeless 3 1 1 1 2   PHQ - 2 Score 3 1 1 1 2   Altered sleeping 0    3  Tired, decreased energy 0    1  Change in appetite 0    1  Feeling bad or failure about yourself  0    0  Trouble concentrating     3  Moving slowly or fidgety/restless 0    0  Suicidal thoughts 0    0  PHQ-9 Score 3    10  Difficult doing work/chores Somewhat difficult    Somewhat difficult    Flowsheet Row Video Visit from 02/07/2022 in [facility] Video Visit from 11/15/2021 in [facility] Video Visit from 10/04/2021 in [facility] No  Risk No Risk No Risk        Pt is aware that these meds carry a teratogenic risk. Pt will discuss plan of action if she does or plans to become pregnant in the future.  Status of current problems: overall stable  Meds: d/c Lamitcal- she stopped it on her own a while ago Decrease Wellbutrin XL 150mg  po qD per patient preference  1. MDD (major depressive disorder), recurrent episode, mild (HCC) - buPROPion (WELLBUTRIN XL) 150 MG 24 hr tablet; Take 1 tablet (150 mg total) by mouth daily.  Dispense: 90 tablet; Refill: 0  2. Insomnia due to other mental disorder - diazepam (VALIUM) 10 MG tablet; Take 1 tablet (10 mg total) by mouth  every 12 (twelve) hours as needed for anxiety.  Dispense: 60 tablet; Refill: 1  3. PTSD (post-traumatic stress disorder) - diazepam (VALIUM) 10 MG tablet; Take 1 tablet (10 mg total) by mouth every 12 (twelve) hours as needed for anxiety.  Dispense: 60 tablet; Refill: 1  4. Attention deficit hyperactivity disorder (ADHD), predominantly inattentive type - buPROPion (WELLBUTRIN XL) 150 MG 24 hr tablet; Take 1 tablet (150 mg total) by mouth daily.  Dispense: 90 tablet; Refill: 0     Labs: none    Therapy: brief supportive therapy provided. Discussed psychosocial stressors in detail.      Collaboration of Care: Other none  Patient/Guardian was advised Release of Information must be obtained prior to any record release in order to collaborate their care with an outside provider. Patient/Guardian was advised if they have not already done so to contact the registration department to sign all necessary forms in order for to release information regarding their care.   Consent: Patient/Guardian gives verbal consent for treatment and assignment of benefits for services provided during this visit. Patient/Guardian expressed understanding and agreed to proceed.     Follow Up Instructions: Follow up in 2-3 months or sooner if needed    I discussed the assessment and treatment plan with the patient. The patient was provided an opportunity to ask questions and all were answered. The patient agreed with the plan and demonstrated an understanding of the instructions.   The patient was advised to call back or seek an in-person evaluation if the symptoms worsen or if the condition fails to improve as anticipated.  I provided 13 minutes of non-face-to-face time during this encounter.   [place], MD

## 2022-04-11 ENCOUNTER — Telehealth (HOSPITAL_BASED_OUTPATIENT_CLINIC_OR_DEPARTMENT_OTHER): Payer: Medicare HMO | Admitting: Psychiatry

## 2022-04-11 DIAGNOSIS — F5105 Insomnia due to other mental disorder: Secondary | ICD-10-CM | POA: Diagnosis not present

## 2022-04-11 DIAGNOSIS — F4310 Post-traumatic stress disorder, unspecified: Secondary | ICD-10-CM | POA: Diagnosis not present

## 2022-04-11 DIAGNOSIS — F900 Attention-deficit hyperactivity disorder, predominantly inattentive type: Secondary | ICD-10-CM | POA: Diagnosis not present

## 2022-04-11 DIAGNOSIS — F330 Major depressive disorder, recurrent, mild: Secondary | ICD-10-CM | POA: Diagnosis not present

## 2022-04-11 MED ORDER — DIAZEPAM 10 MG PO TABS: 10.0000 mg | ORAL_TABLET | Freq: Two times a day (BID) | ORAL | 1 refills | Status: DC | PRN

## 2022-04-11 MED ORDER — FLUOXETINE HCL 20 MG PO CAPS
20.0000 mg | ORAL_CAPSULE | Freq: Every day | ORAL | 0 refills | Status: DC
Start: 2022-04-11 — End: 2022-06-20
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
## 2022-06-20 ENCOUNTER — Telehealth (HOSPITAL_BASED_OUTPATIENT_CLINIC_OR_DEPARTMENT_OTHER): Payer: Medicare HMO | Admitting: Psychiatry

## 2022-06-20 DIAGNOSIS — F330 Major depressive disorder, recurrent, mild: Secondary | ICD-10-CM | POA: Diagnosis not present

## 2022-06-20 DIAGNOSIS — F4310 Post-traumatic stress disorder, unspecified: Secondary | ICD-10-CM

## 2022-06-20 DIAGNOSIS — F99 Mental disorder, not otherwise specified: Secondary | ICD-10-CM | POA: Diagnosis not present

## 2022-06-20 DIAGNOSIS — F5105 Insomnia due to other mental disorder: Secondary | ICD-10-CM | POA: Diagnosis not present

## 2022-06-20 MED ORDER — FLUOXETINE HCL 40 MG PO CAPS: 40.0000 mg | ORAL_CAPSULE | Freq: Every day | ORAL | 0 refills | Status: DC

## 2022-06-20 MED ORDER — DIAZEPAM 10 MG PO TABS: 10.0000 mg | ORAL_TABLET | Freq: Two times a day (BID) | ORAL | 1 refills | Status: DC | PRN

## 2022-06-20 NOTE — Progress Notes (Signed)
Virtual Visit via Video Note  I connected with Gwendlyn Deutscher on 06/20/22 at  9:30 AM EST by  a video enabled telemedicine application and verified that I am speaking with the correct person using two identifiers.  Location: Patient: home Provider: office   I discussed the limitations of evaluation and management by telemedicine and the availability of in person appointments. The patient expressed understanding and agreed to proceed.  History of Present Illness: Dail shares she hired a Marine scientist to help with her mom but the nurse has not been very reliable. The last few days have not been good for Jolane. At times her mom triggers Patrica and Alpa has responded by distancing herself from time with mom. Gelena feels like she is falling back into the depression she was experiencing when she was taking care of her mom previously. Mariona knows her mom is not going to last very long and her mom's memory is declining. Yanisa is sleeping well. She is getting 7-8 hrs of sleep most nights. In the last 2 weeks she has been depressed more days then not.  She has a lot of personal things going on and is feeling overwhelmed by everything. Sometimes she has low motivation but is able to push herself to do what she needs to. She is doing things like working out and swimming and enjoys it. She denies SI/HI.  She denies nightmares and flashbacks and has rare intrusive memories. Her focus is good and she can get the things done that she needs to. Tara takes the Valium at night most days. She takes another dose 2-3 times/week. She is using coping skills that also help. The Prozac is really helping and she denies SE.    Observations/Objective: Psychiatric Specialty Exam: ROS  There were no vitals taken for this visit.There is no height or weight on file to calculate BMI.  General Appearance: Casual and Fairly Groomed  Eye Contact:  Good  Speech:  Clear and Coherent and Normal Rate  Volume:  Normal  Mood:  Anxious and Depressed   Affect:  Full Range  Thought Process:  Coherent and Descriptions of Associations: Circumstantial  Orientation:  Full (Time, Place, and Person)  Thought Content:  Rumination  Suicidal Thoughts:  No  Homicidal Thoughts:  No  Memory:  Immediate;   Good  Judgement:  Good  Insight:  Good  Psychomotor Activity:  Normal  Concentration:  Concentration: Good  Recall:  Good  Fund of Knowledge:  Good  Language:  Good  Akathisia:  No  Handed:  Right  AIMS (if indicated):     Assets:  Communication Skills Desire for Improvement Financial Resources/Insurance Housing Intimacy Resilience Social Support Talents/Skills Transportation Vocational/Educational  ADL's:  Intact  Cognition:  WNL  Sleep:        Assessment and Plan:     06/20/2022    9:48 AM 04/11/2022    9:47 AM 02/07/2022   11:46 AM 11/15/2021    9:45 AM 10/04/2021   10:38 AM  Depression screen PHQ 2/9  Decreased Interest 0 0 0 0 0  Down, Depressed, Hopeless 2 0 3 1 1  $ PHQ - 2 Score 2 0 3 1 1  $ Altered sleeping 0  0    Tired, decreased energy 1  0    Change in appetite 0  0    Feeling bad or failure about yourself  0  0    Trouble concentrating 0      Moving slowly or fidgety/restless 0  0  Suicidal thoughts 0  0    PHQ-9 Score 3  3    Difficult doing work/chores Very difficult  Somewhat difficult      Flowsheet Row Video Visit from 04/11/2022 in [facility] Video Visit from 02/07/2022 in [facility] Video Visit from 11/15/2021 in [facility] No Risk No Risk No Risk          Pt is aware that these meds carry a teratogenic risk. Pt will discuss plan of action if she does or plans to become pregnant in the future.  Status of current problems: worsening depression and anxiety   Medication management with supportive therapy. Risks and benefits, side effects and alternative  treatment options discussed with patient. Pt was given an opportunity to ask questions about medication, illness, and treatment. All current psychiatric medications have been reviewed and discussed with the patient and adjusted as clinically appropriate.  Pt verbalized understanding and verbal consent obtained for treatment.  Meds:  increase Prozac 72m po qD 1. MDD (major depressive disorder), recurrent episode, mild (HCC) - FLUoxetine (PROZAC) 40 MG capsule; Take 1 capsule (40 mg total) by mouth daily.  Dispense: 90 capsule; Refill: 0  2. PTSD (post-traumatic stress disorder) - diazepam (VALIUM) 10 MG tablet; Take 1 tablet (10 mg total) by mouth every 12 (twelve) hours as needed for anxiety.  Dispense: 60 tablet; Refill: 1 - FLUoxetine (PROZAC) 40 MG capsule; Take 1 capsule (40 mg total) by mouth daily.  Dispense: 90 capsule; Refill: 0  3. Insomnia due to other mental disorder - diazepam (VALIUM) 10 MG tablet; Take 1 tablet (10 mg total) by mouth every 12 (twelve) hours as needed for anxiety.  Dispense: 60 tablet; Refill: 1 - FLUoxetine (PROZAC) 40 MG capsule; Take 1 capsule (40 mg total) by mouth daily.  Dispense: 90 capsule; Refill: 0     Labs: none   Therapy: brief supportive therapy provided. Discussed psychosocial stressors in detail.      Collaboration of Care: Other n/a  Patient/Guardian was advised Release of Information must be obtained prior to any record release in order to collaborate their care with an outside provider. Patient/Guardian was advised if they have not already done so to contact the registration department to sign all necessary forms in order for [facility] release information regarding their care.   Consent: Patient/Guardian gives verbal consent for treatment and assignment of benefits for services provided during this visit. Patient/Guardian expressed understanding and agreed to proceed.   Follow Up Instructions: Follow up in 2 months or sooner if needed    I  discussed the assessment and treatment plan with the patient. The patient was provided an opportunity to ask questions and all were answered. The patient agreed with the plan and demonstrated an understanding of the instructions.   The patient was advised to call back or seek an in-person evaluation if the symptoms worsen or if the condition fails to improve as anticipated.  I provided 16 minutes of non-face-to-face time during this encounter.   SCharlcie Cradle MD

## 2022-06-30 ENCOUNTER — Encounter (HOSPITAL_COMMUNITY): Payer: Self-pay

## 2022-06-30 ENCOUNTER — Other Ambulatory Visit: Payer: Self-pay

## 2022-06-30 ENCOUNTER — Emergency Department (HOSPITAL_COMMUNITY): Payer: Medicare HMO

## 2022-06-30 ENCOUNTER — Emergency Department (HOSPITAL_COMMUNITY)
Admission: EM | Admit: 2022-06-30 | Discharge: 2022-06-30 | Disposition: A | Discharge status: Home / Self Care | Payer: Medicare HMO | Attending: Emergency Medicine | Admitting: Emergency Medicine

## 2022-06-30 DIAGNOSIS — U071 COVID-19: Secondary | ICD-10-CM | POA: Insufficient documentation

## 2022-06-30 DIAGNOSIS — R000 Tachycardia, unspecified: Secondary | ICD-10-CM | POA: Diagnosis not present

## 2022-06-30 DIAGNOSIS — R519 Headache, unspecified: Secondary | ICD-10-CM | POA: Diagnosis present

## 2022-06-30 DIAGNOSIS — R042 Hemoptysis: Secondary | ICD-10-CM | POA: Diagnosis not present

## 2022-06-30 LAB — I-STAT BETA HCG BLOOD, ED (MC, WL, AP ONLY): I-stat hCG, quantitative: <5 m[IU]/mL (ref <5)

## 2022-06-30 LAB — BASIC METABOLIC PANEL
Anion gap: 11 (ref 5–15)
BUN: 11 mg/dL (ref 6–20)
CO2: 22 mmol/L (ref 22–32)
Calcium: 9 mg/dL (ref 8.9–10.3)
Chloride: 101 mmol/L (ref 98–111)
Creatinine, Ser: 0.97 mg/dL (ref 0.44–1.00)
GFR, Estimated: >60 mL/min (ref >60)
Glucose, Bld: 115 mg/dL — ABNORMAL HIGH (ref 70–99)
Potassium: 3.7 mmol/L (ref 3.5–5.1)
Sodium: 134 mmol/L — ABNORMAL LOW (ref 135–145)

## 2022-06-30 LAB — CBC
HCT: 41.1 % (ref 36.0–46.0)
Hemoglobin: 13.8 g/dL (ref 12.0–15.0)
MCH: 29.2 pg (ref 26.0–34.0)
MCHC: 33.6 g/dL (ref 30.0–36.0)
MCV: 86.9 fL (ref 80.0–100.0)
Platelets: 247 10*3/uL (ref 150–400)
RBC: 4.73 MIL/uL (ref 3.87–5.11)
RDW: 13 % (ref 11.5–15.5)
WBC: 13.4 10*3/uL — ABNORMAL HIGH (ref 4.0–10.5)
nRBC: 0 % (ref 0.0–0.2)

## 2022-06-30 LAB — RESP PANEL BY RT-PCR (RSV, FLU A&B, COVID)  RVPGX2
Influenza A by PCR: NEGATIVE
Influenza B by PCR: NEGATIVE
Resp Syncytial Virus by PCR: NEGATIVE
SARS Coronavirus 2 by RT PCR: POSITIVE — AB

## 2022-06-30 MED ORDER — KETOROLAC TROMETHAMINE 15 MG/ML IJ SOLN
15.0000 mg | Freq: Once | INTRAMUSCULAR | Status: AC
  Administered 2022-06-30: 15 mg via INTRAVENOUS
  Filled 2022-06-30: qty 1

## 2022-06-30 MED ORDER — ACETAMINOPHEN 500 MG PO TABS
1000.0000 mg | ORAL_TABLET | Freq: Once | ORAL | Status: AC
  Administered 2022-06-30: 1000 mg via ORAL
  Filled 2022-06-30: qty 2

## 2022-06-30 MED ORDER — METOCLOPRAMIDE HCL 5 MG/ML IJ SOLN
10.0000 mg | Freq: Once | INTRAMUSCULAR | Status: AC
  Administered 2022-06-30: 10 mg via INTRAVENOUS
  Filled 2022-06-30: qty 2

## 2022-06-30 MED ORDER — SODIUM CHLORIDE 0.9 % IV BOLUS
1000.0000 mL | Freq: Once | INTRAVENOUS | Status: AC
  Administered 2022-06-30: 1000 mL via INTRAVENOUS

## 2022-06-30 MED ORDER — DEXAMETHASONE SODIUM PHOSPHATE 10 MG/ML IJ SOLN
10.0000 mg | Freq: Once | INTRAMUSCULAR | Status: AC
  Administered 2022-06-30: 10 mg via INTRAVENOUS
  Filled 2022-06-30: qty 1

## 2022-06-30 NOTE — ED Triage Notes (Signed)
Pt c/o HA, nasal drainage, nasal congestion, fatiguex3d. coughed up blood one time yesterday

## 2022-06-30 NOTE — ED Provider Notes (Signed)
[facility] Provider Note   CSN: [id number] Arrival date & time: 06/30/22  [id number]     History  Chief Complaint  Patient presents with   Headache    Ronnell Asselin is a 62 y.o. female presenting to ED c/o generalized headache, photophobia, nasal congestion, and fatigue since yesterday morning. She denies abdominal pain, chest pain, shortness of breath, sore throat, fever, chills, neck pain, nausea, vomiting, diarrhea, head injury, focal weakness, vision changes, dizziness, lightheadedness, syncope or other complaints. She has PMH migraine headaches previously on Topamax preventive therapy but has been off this for several months as her headaches had become very infrequent. No known sick contacts.       Home Medications Prior to Admission medications   Medication Sig Start Date End Date Taking? Authorizing Provider  diazepam (VALIUM) 10 MG tablet Take 1 tablet (10 mg total) by mouth every 12 (twelve) hours as needed for anxiety. 06/20/22   Charlcie Cradle, MD  FLUoxetine (PROZAC) 40 MG capsule Take 1 capsule (40 mg total) by mouth daily. 06/20/22   Charlcie Cradle, MD      Allergies    Anesthetics, amide; Other; Flexeril [cyclobenzaprine]; Methocarbamol; and Singulair [montelukast]    Review of Systems   Review of Systems  All other systems reviewed and are negative.   Physical Exam Updated Vital Signs BP 138/88 (BP Location: Right Arm)   Pulse (!) 109   Temp 99 F (37.2 C) (Oral)   Resp 18   Ht '5\' 4"'$  (1.626 m)   Wt 69.3 kg   SpO2 95%   BMI 26.22 kg/m  Physical Exam Vitals and nursing note reviewed.  Constitutional:      General: She is not in acute distress.    Appearance: Normal appearance. She is not ill-appearing or toxic-appearing.  HENT:     Head: Normocephalic and atraumatic.     Mouth/Throat:     Mouth: Mucous membranes are moist.  Eyes:     Extraocular Movements: Extraocular movements intact.     Conjunctiva/sclera:  Conjunctivae normal.     Pupils: Pupils are equal, round, and reactive to light.  Cardiovascular:     Rate and Rhythm: Normal rate and regular rhythm.     Heart sounds: No murmur heard. Pulmonary:     Effort: Pulmonary effort is normal.     Breath sounds: Normal breath sounds.  Abdominal:     General: Abdomen is flat. There is no distension.     Palpations: Abdomen is soft.     Tenderness: There is no abdominal tenderness. There is no guarding.  Musculoskeletal:        General: Normal range of motion.     Cervical back: Normal range of motion and neck supple. No rigidity.     Right lower leg: No edema.     Left lower leg: No edema.  Skin:    General: Skin is warm and dry.     Capillary Refill: Capillary refill takes less than 2 seconds.  Neurological:     Mental Status: She is alert and oriented to person, place, and time. Mental status is at baseline.     GCS: GCS eye subscore is 4. GCS verbal subscore is 5. GCS motor subscore is 6.     Cranial Nerves: Cranial nerves 2-12 are intact. No cranial nerve deficit or facial asymmetry.     Sensory: Sensation is intact.     Motor: Motor function is intact. No weakness, tremor, atrophy  or abnormal muscle tone.     Coordination: Coordination is intact.     Gait: Gait is intact.  Psychiatric:        Behavior: Behavior normal.     ED Results / Procedures / Treatments   Labs (all labs ordered are listed, but only abnormal results are displayed) Labs Reviewed  RESP PANEL BY RT-PCR (RSV, FLU A&B, COVID)  RVPGX2 - Abnormal; Notable for the following components:      Result Value   SARS Coronavirus 2 by RT PCR POSITIVE (*)    All other components within normal limits  BASIC METABOLIC PANEL - Abnormal; Notable for the following components:   Sodium 134 (*)    Glucose, Bld 115 (*)    All other components within normal limits  CBC - Abnormal; Notable for the following components:   WBC 13.4 (*)    All other components within normal limits   I-STAT BETA HCG BLOOD, ED ([facility], [facility], [facility] ONLY)    EKG None  Radiology DG Chest 2 View  Result Date: 06/30/2022 CLINICAL DATA:  Hemoptysis EXAM: CHEST - 2 VIEW COMPARISON:  02/03/2019 FINDINGS: The heart size and mediastinal contours are within normal limits. No focal airspace consolidation, pleural effusion, or pneumothorax. The visualized skeletal structures are unremarkable. Bilateral breast prostheses. IMPRESSION: No active cardiopulmonary disease. Electronically Signed   By: Davina Poke D.O.   On: 06/30/2022 10:08    Procedures Procedures    Medications Ordered in ED Medications  ketorolac (TORADOL) 15 MG/ML injection 15 mg (15 mg Intravenous Given 06/30/22 1055)  metoCLOPramide (REGLAN) injection 10 mg (10 mg Intravenous Given 06/30/22 1054)  dexamethasone (DECADRON) injection 10 mg (10 mg Intravenous Given 06/30/22 1055)  sodium chloride 0.9 % bolus 1,000 mL (0 mLs Intravenous Stopped 06/30/22 1153)  acetaminophen (TYLENOL) tablet 1,000 mg (1,000 mg Oral Given 06/30/22 1055)    ED Course/ Medical Decision Making/ A&P                             Medical Decision Making Amount and/or Complexity of Data Reviewed Labs: ordered. Decision-making details documented in ED Course. Radiology: ordered. Decision-making details documented in ED Course.  Medical Decision Making:   Rosemary Paullin is a 62 y.o. female who presented to the ED today with headache and URI symptoms detailed above.    Patient's presentation is complicated by their history of migraine headaches.  Complete initial physical exam performed, notably the patient  was alert, oriented, neurologically intact with 5/5 strength bilateral UE and LE, CNI.    Reviewed and confirmed nursing documentation for past medical history, family history, social history.    Initial Assessment:   With the patient's presentation of headache and URI symptoms, most likely diagnosis is viral infection. Other diagnoses were considered including  (but not limited to) emergent considerations for headache include subarachnoid hemorrhage, meningitis, temporal arteritis, glaucoma, cerebral ischemia, carotid/vertebral dissection, intracranial tumor, Venous sinus thrombosis, acute or chronic subdural hemorrhage.  Other considerations include: Migraine, Cluster headache, Hypertension, Caffeine, alcohol, or drug withdrawal, Pseudotumor cerebri, Arteriovenous malformation, Head injury, Post-lumbar puncture, Tension headache, Sinusitis, Cervical arthritis, Refractive error causing strain, Temporomandibular joint syndrome, Depression, Somatoform disorder (eg, somatization) Trigeminal neuralgia, Glossopharyngeal neuralgia. . These are considered less likely due to history of present illness and physical exam findings.   This is most consistent with an acute complicated illness  Initial Plan:   Screening labs including CBC and Metabolic panel to evaluate for  infectious or metabolic etiology of disease.  CXR to evaluate for structural/infectious intrathoracic pathology.  Viral swabs Headache cocktail and fluids Objective evaluation as reviewed   Initial Study Results:   Laboratory  All laboratory results reviewed without evidence of clinically relevant pathology.   Exceptions include: positive COVID swab   Radiology:  All images reviewed independently. Agree with radiology report at this time.   DG Chest 2 View  Result Date: 06/30/2022 CLINICAL DATA:  Hemoptysis EXAM: CHEST - 2 VIEW COMPARISON:  02/03/2019 FINDINGS: The heart size and mediastinal contours are within normal limits. No focal airspace consolidation, pleural effusion, or pneumothorax. The visualized skeletal structures are unremarkable. Bilateral breast prostheses. IMPRESSION: No active cardiopulmonary disease. Electronically Signed   By: Davina Poke D.O.   On: 06/30/2022 10:08     Final Assessment and Plan:   This is a 62 year old female presenting to ED c/o generalized headache  and upper respiratory symptoms since yesterday. History infrequent migraine headaches currently off preventatives. Slightly tachycardic but afebrile and with otherwise unremarkable vital signs. Neurologically intact on exam with no signs of meningismus. Mild leukocytosis at 13.4 but otherwise lab work reassuring. Headache cocktail ordered for symptomatic management. CXR negative for pneumonia. Viral swabs positive for COVID. Pt denies underlying history of heart, lung, or kidney disease. Disease appears mild at this time and she had improvement with headache cocktail. Discussed findings with pt and believe she is stable for outpatient management. Given isolation recommendations per latest CDC guidelines. Discussed strict ED return precautions. Pt expressed understanding of this. All questions answered and pt stable for discharge.     Clinical Impression:  1. COVID-19      Discharge           Final Clinical Impression(s) / ED Diagnoses Final diagnoses:  [id number]    Rx / DC Orders ED Discharge Orders     None         Turner Daniels 06/30/22 Skip Estimable, MD 07/01/22 [phone]

## 2022-06-30 NOTE — Discharge Instructions (Signed)
Thank you for letting us take care of you today.  Your COVID swab was positive.  Your chest x-ray did not show signs of pneumonia or other infection.  We gave you medications and fluids to treat your headache.  You should quarantine until day 5 of her symptoms and then from days 5-10 wear a mask when exposed to others.  If you develop any worsening symptoms such as shortness of breath, chest pain, lightheadedness or dizziness, high fevers uncontrolled with over-the-counter medications, confusion, or other concerns, please be reevaluated in the nearest emergency department.  If you continue to have bothersome symptoms after a week, please follow-up with your PCP as needed.  Please see instructions below for managing a viral illness.  Viral Illness TREATMENT  Treatment is directed at relieving symptoms. There is no cure. Antibiotics are not effective because the infection is caused by a virus, not by bacteria. Treatment may include:  Increased fluid intake. Sports drinks offer valuable electrolytes, sugars, and fluids.  Breathing heated mist or steam (vaporizer or shower).  Eating chicken soup or other clear broths, and maintaining good nutrition.  Getting plenty of rest.  Using gargles or lozenges for comfort.  Increasing usage of your inhaler if you have asthma.  Return to work when your temperature has returned to normal.  Gargle warm salt water and spit it out for sore throat. Take benadryl to decrease sinus secretions. Continue to alternate between Tylenol and ibuprofen for pain and fever control.  You may take up to 1000 mg Tylenol at once.  Do not take more than 4000 mg in a day.  You may take up to 600 mg ibuprofen at once.  Do not take more than 3200 mg in 1 day.  Follow Up: Follow up with your primary care doctor in 5-7 days for recheck of ongoing symptoms.  Return to emergency department for emergent changing or worsening of symptoms.

## 2022-06-30 NOTE — ED Notes (Signed)
AVS reviewed with pt prior to discharge. Pt verbalizes understanding. Belongings with pt upon depart. Ambulatory to POV with husband.

## 2022-08-15 ENCOUNTER — Telehealth (HOSPITAL_BASED_OUTPATIENT_CLINIC_OR_DEPARTMENT_OTHER): Payer: Medicare HMO | Admitting: Psychiatry

## 2022-08-15 DIAGNOSIS — F5105 Insomnia due to other mental disorder: Secondary | ICD-10-CM | POA: Diagnosis not present

## 2022-08-15 DIAGNOSIS — F330 Major depressive disorder, recurrent, mild: Secondary | ICD-10-CM

## 2022-08-15 DIAGNOSIS — F99 Mental disorder, not otherwise specified: Secondary | ICD-10-CM

## 2022-08-15 DIAGNOSIS — F4310 Post-traumatic stress disorder, unspecified: Secondary | ICD-10-CM | POA: Diagnosis not present

## 2022-08-15 MED ORDER — FLUOXETINE HCL 20 MG PO CAPS: 60.0000 mg | ORAL_CAPSULE | Freq: Every day | ORAL | 0 refills | Status: DC

## 2022-08-15 MED ORDER — DIAZEPAM 10 MG PO TABS: 10.0000 mg | ORAL_TABLET | Freq: Two times a day (BID) | ORAL | 1 refills | Status: DC | PRN

## 2022-08-15 NOTE — Progress Notes (Signed)
Virtual Visit via Video Note  I connected with Christine Douglas on 08/15/22 at  9:30 AM EDT by a video enabled telemedicine application and verified that I am speaking with the correct person using two identifiers.  Location: Patient: sitting in parked car Provider: office   I discussed the limitations of evaluation and management by telemedicine and the availability of in person appointments. The patient expressed understanding and agreed to proceed.  History of Present Illness: Christine Douglas shares she is doing well. She feels like she is in a little bubble since moving her mom back in. It feels like she did when her mom lived with her in the past. She is trying to change it. She feels the Prozac is working but it can't change her home problems. Her sleep and energy are "too good". 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 She is also getting her house remodeled. She is overwhelmed and her anxiety is high. She is feeling sad and is endorsing some anhedonia. Christine Douglas denies hopelessness. Her appetite is decreased. She is eating 1 meal and some snacks each day. Christine Douglas denies worthlessness and guilt. Her kids don't come and see her and she has no contact with her grandkids. She denies SI/HI. Her PTSD is ongoing. She avoids crowds and her hypervigilance remains high. Her concentration is ok most days.    Observations/Objective: Psychiatric Specialty Exam: ROS  There were no vitals taken for this visit.There is no height or weight on file to calculate BMI.  General Appearance: Neat and Well Groomed  Eye Contact:  Good  Speech:  Clear and Coherent and Normal Rate  Volume:  Normal  Mood:  Anxious and Depressed  Affect:  Full Range  Thought Process:  Coherent and Descriptions of Associations: Circumstantial  Orientation:  Full (Time, Place, and Person)  Thought Content:  Logical  Suicidal Thoughts:  No  Homicidal Thoughts:  No  Memory:  Immediate;   Good  Judgement:  Good  Insight:  Good   Psychomotor Activity:  Normal  Concentration:  Concentration: Good  Recall:  Good  Fund of Knowledge:  Good  Language:  Good  Akathisia:  No  Handed:  Right  AIMS (if indicated):     Assets:  Communication Skills Desire for Improvement Financial Resources/Insurance Housing Intimacy Resilience Social Support Talents/Skills Transportation Vocational/Educational  ADL's:  Intact  Cognition:  WNL  Sleep:        Assessment and Plan:     08/15/2022    9:43 AM 06/20/2022    9:48 AM 04/11/2022    9:47 AM 02/07/2022   11:46 AM 11/15/2021    9:45 AM  Depression screen PHQ 2/9  Decreased Interest 2 0 0 0 0  Down, Depressed, Hopeless 2 2 0 3 1  PHQ - 2 Score 4 2 0 3 1  Altered sleeping 0 0  0   Tired, decreased energy 0 1  0   Change in appetite 3 0  0   Feeling bad or failure about yourself  0 0  0   Trouble concentrating 0 0     Moving slowly or fidgety/restless 0 0  0   Suicidal thoughts 0 0  0   PHQ-9 Score 7 3  3    Difficult doing work/chores Very difficult Very difficult  Somewhat difficult     Flowsheet Row ED from 06/30/2022 in [facility] Video Visit from [facility]/11[facility]2022 [facility] Video  [facility]  C-SSRS RISK CATEGORY No Risk No Risk No Risk          Pt is aware that these meds carry a teratogenic risk. Pt will discuss plan of action if she does or plans to become pregnant in the future.  Status of current problems: ongoing depression and anxiety   Medication management with supportive therapy. Risks and benefits, side effects and alternative treatment options discussed with patient. Pt was given an opportunity to ask questions about medication, illness, and treatment. All current psychiatric medications have been reviewed and discussed with the patient and adjusted as clinically appropriate.  Pt verbalized  understanding and verbal consent obtained for treatment.  Meds: increase Prozac 60mg  po qD 1. MDD (major depressive disorder), recurrent episode, mild - FLUoxetine (PROZAC) 20 MG capsule; Take 3 capsules (60 mg total) by mouth daily.  Dispense: 270 capsule; Refill: 0  2. PTSD (post-traumatic stress disorder) - FLUoxetine (PROZAC) 20 MG capsule; Take 3 capsules (60 mg total) by mouth daily.  Dispense: 270 capsule; Refill: 0 - diazepam (VALIUM) 10 MG tablet; Take 1 tablet (10 mg total) by mouth every 12 (twelve) hours as needed for anxiety.  Dispense: 60 tablet; Refill: 1  3. Insomnia due to other mental disorder - FLUoxetine (PROZAC) 20 MG capsule; Take 3 capsules (60 mg total) by mouth daily.  Dispense: 270 capsule; Refill: 0 - diazepam (VALIUM) 10 MG tablet; Take 1 tablet (10 mg total) by mouth every 12 (twelve) hours as needed for anxiety.  Dispense: 60 tablet; Refill: 1     Labs: none    Therapy: brief supportive therapy provided. Discussed psychosocial stressors in detail. She is working on Pharmacologist like mediation.   Collaboration of Care: Patient refused AEB therapy  Patient/Guardian was advised Release of Information must be obtained prior to any record release in order to collaborate their care with an outside provider. Patient/Guardian was advised if they have not already done so to contact the registration department to sign all necessary forms in order for [place] to release information regarding their care.   Consent: Patient/Guardian gives verbal consent for treatment and assignment of benefits for services provided during this visit. Patient/Guardian expressed understanding and agreed to proceed.    Follow Up Instructions: Follow up in 1-2 months or sooner if needed    I discussed the assessment and treatment plan with the patient. The patient was provided an opportunity to ask questions and all were answered. The patient agreed with the plan and demonstrated an  understanding of the instructions.   The patient was advised to call back or seek an in-person evaluation if the symptoms worsen or if the condition fails to improve as anticipated.  I provided 14 minutes of non-face-to-face time during this encounter.   Oletta Darter, MD

## 2022-08-21 ENCOUNTER — Other Ambulatory Visit: Payer: Self-pay

## 2022-08-21 ENCOUNTER — Emergency Department (HOSPITAL_COMMUNITY)
Admission: EM | Admit: 2022-08-21 | Discharge: 2022-08-21 | Disposition: A | Discharge status: Home / Self Care | Payer: Medicare HMO | Attending: Emergency Medicine | Admitting: Emergency Medicine

## 2022-08-21 ENCOUNTER — Encounter (HOSPITAL_COMMUNITY): Payer: Self-pay

## 2022-08-21 DIAGNOSIS — N6320 Unspecified lump in the left breast, unspecified quadrant: Secondary | ICD-10-CM | POA: Diagnosis not present

## 2022-08-21 DIAGNOSIS — D72829 Elevated white blood cell count, unspecified: Secondary | ICD-10-CM | POA: Diagnosis not present

## 2022-08-21 DIAGNOSIS — S30861A Insect bite (nonvenomous) of abdominal wall, initial encounter: Secondary | ICD-10-CM

## 2022-08-21 DIAGNOSIS — W57XXXA Bitten or stung by nonvenomous insect and other nonvenomous arthropods, initial encounter: Secondary | ICD-10-CM | POA: Insufficient documentation

## 2022-08-21 DIAGNOSIS — N6321 Unspecified lump in the left breast, upper outer quadrant: Secondary | ICD-10-CM | POA: Insufficient documentation

## 2022-08-21 LAB — CBC WITH DIFFERENTIAL/PLATELET
Abs Immature Granulocytes: 0.04 10*3/uL (ref 0.00–0.07)
Basophils Absolute: 0.1 10*3/uL (ref 0.0–0.1)
Basophils Relative: 1 %
Eosinophils Absolute: 0.2 10*3/uL (ref 0.0–0.5)
Eosinophils Relative: 2 %
HCT: 37.6 % (ref 36.0–46.0)
Hemoglobin: 12.5 g/dL (ref 12.0–15.0)
Immature Granulocytes: 0 %
Lymphocytes Relative: 46 %
Lymphs Abs: 5.7 10*3/uL — ABNORMAL HIGH (ref 0.7–4.0)
MCH: 29.4 pg (ref 26.0–34.0)
MCHC: 33.2 g/dL (ref 30.0–36.0)
MCV: 88.5 fL (ref 80.0–100.0)
Monocytes Absolute: 0.5 10*3/uL (ref 0.1–1.0)
Monocytes Relative: 4 %
Neutro Abs: 5.7 10*3/uL (ref 1.7–7.7)
Neutrophils Relative %: 47 %
Platelets: 292 10*3/uL (ref 150–400)
RBC: 4.25 MIL/uL (ref 3.87–5.11)
RDW: 12.7 % (ref 11.5–15.5)
Smear Review: NORMAL
WBC: 12.2 10*3/uL — ABNORMAL HIGH (ref 4.0–10.5)
nRBC: 0 % (ref 0.0–0.2)

## 2022-08-21 LAB — COMPREHENSIVE METABOLIC PANEL
ALT: 12 U/L (ref 0–44)
AST: 20 U/L (ref 15–41)
Albumin: 3.7 g/dL (ref 3.5–5.0)
Alkaline Phosphatase: 91 U/L (ref 38–126)
Anion gap: 10 (ref 5–15)
BUN: 12 mg/dL (ref 6–20)
CO2: 25 mmol/L (ref 22–32)
Calcium: 9 mg/dL (ref 8.9–10.3)
Chloride: 101 mmol/L (ref 98–111)
Creatinine, Ser: 0.91 mg/dL (ref 0.44–1.00)
GFR, Estimated: >60 mL/min (ref >60)
Glucose, Bld: 111 mg/dL — ABNORMAL HIGH (ref 70–99)
Potassium: 4 mmol/L (ref 3.5–5.1)
Sodium: 136 mmol/L (ref 135–145)
Total Bilirubin: 0.3 mg/dL (ref 0.3–1.2)
Total Protein: 6.5 g/dL (ref 6.5–8.1)

## 2022-08-21 LAB — URINALYSIS, ROUTINE W REFLEX MICROSCOPIC
Bilirubin Urine: NEGATIVE
Glucose, UA: NEGATIVE mg/dL
Hgb urine dipstick: NEGATIVE
Ketones, ur: NEGATIVE mg/dL
Nitrite: NEGATIVE
Protein, ur: NEGATIVE mg/dL
Specific Gravity, Urine: 1.01 (ref 1.005–1.030)
pH: 5 (ref 5.0–8.0)

## 2022-08-21 LAB — LIPASE, BLOOD: Lipase: 43 U/L (ref 11–51)

## 2022-08-21 MED ORDER — DOXYCYCLINE HYCLATE 100 MG PO TABS
200.0000 mg | ORAL_TABLET | Freq: Once | ORAL | Status: AC
  Administered 2022-08-21: 200 mg via ORAL
  Filled 2022-08-21: qty 2

## 2022-08-21 NOTE — ED Provider Triage Note (Addendum)
Emergency Medicine Provider Triage Evaluation Note  Christine Douglas , a 62 y.o. female  was evaluated in triage.  Pt complains of breast mass and recent tick bite. H/o breast implants.  Noticed mass in her left breast on Saturday.  It is on the lateral side of her breast and about the size of a walnut.  It is not painful but states she has had a history of a benign lump in her breast that was monitored 2 years ago but workup was unremarkable.  Also mention that she was bit by a tick around her umbilicus 3 days ago.  Denies fever, swelling or redness around the site.  But site is still irritated and itching.  States she was going to see her PCP for these complaints but cannot schedule appointment which prompted her to come here today.  Ultimately the pain around her umbilicus is most concerning.  Review of Systems  Positive: See above Negative: See above  Physical Exam  BP 106/69 (BP Location: Right Arm)   Pulse 83   Temp 98.8 F (37.1 C) (Oral)   Resp 18   Ht  (1.626 m)   Wt 62.1 kg   SpO2 99%   BMI 23.52 kg/m  Gen:   Awake, no distress   Resp:  Normal effort  MSK:   Moves extremities without difficulty  Other:  Painless mass in lateral left breast  Medical Decision Making  Medically screening exam initiated at 12:18 PM.  Appropriate orders placed.  Rashel Nevers was informed that the remainder of the evaluation will be completed by another provider, this initial triage assessment does not replace that evaluation, and the importance of remaining in the ED until their evaluation is complete.  Work up started    [employer], PA-C 08/21/22 1225

## 2022-08-21 NOTE — ED Provider Notes (Signed)
[facility] EMERGENCY DEPARTMENT AT [facility] Provider Note   CSN: [id number] Arrival date & time: 08/21/22  1156     History  Chief Complaint  Patient presents with   Breast Mass    Tedi Mirza is a 62 y.o. female.  Patient is a 62 year old female with a past medical history of PTSD presenting to the emergency department with a left-sided breast mass as well as a tick bite.  The patient states that her significant other felt a mass in her left breast about 2 days ago.  She states that it has not changed in size and is only mildly uncomfortable because she has been touching it frequently.  She denies any overlying redness or rash or other skin changes.  She denies any nipple discharge.  She denies any fevers or chills.  The patient states that she also found a tick on her umbilicus this morning that she took off herself.  She does have a picture of it and it is a [place] tick.  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  She denies any rashes.  The history is provided by the patient.       Home Medications Prior to Admission medications   Medication Sig Start Date End Date Taking? Authorizing Provider  diazepam (VALIUM) 10 MG tablet Take 1 tablet (10 mg total) by mouth every 12 (twelve) hours as needed for anxiety. 08/15/22   Oletta Darter, MD  FLUoxetine (PROZAC) 20 MG capsule Take 3 capsules (60 mg total) by mouth daily. 08/15/22 08/15/23  Oletta Darter, MD      Allergies    Anesthetics, amide; Other; Flexeril [cyclobenzaprine]; Methocarbamol; and Singulair [montelukast]    Review of Systems   Review of Systems  Physical Exam Updated Vital Signs BP 110/80   Pulse 81   Temp 98.8 F (37.1 C) (Oral)   Resp 19   Ht  (1.626 m)   Wt 62.1 kg   SpO2 98%   BMI 23.52 kg/m  Physical Exam Vitals and nursing note reviewed.  Constitutional:      General: She is not in  acute distress.    Appearance: Normal appearance.  HENT:     Head: Normocephalic and atraumatic.     Nose: Nose normal.     Mouth/Throat:     Mouth: Mucous membranes are moist.     Pharynx: Oropharynx is clear.  Eyes:     Extraocular Movements: Extraocular movements intact.  Cardiovascular:     Rate and Rhythm: Normal rate.  Pulmonary:     Effort: Pulmonary effort is normal.  Abdominal:     General: Abdomen is flat.  Musculoskeletal:        General: Normal range of motion.     Cervical back: Normal range of motion.  Skin:    General: Skin is warm and dry.     Findings: No rash.     Comments: Approximately 1 cm firm, mobile mass of left breast around the 3 o'clock position with no palpable fluctuance and no surrounding erythema or warmth  Neurological:     General: No focal deficit present.     Mental Status: She is alert and oriented to person, place, and time.  Psychiatric:        Mood and Affect: Mood normal.        Behavior: Behavior normal.  ED Results / Procedures / Treatments   Labs (all labs ordered are listed, but only abnormal results are displayed) Labs Reviewed  CBC WITH DIFFERENTIAL/PLATELET - Abnormal; Notable for the following components:      Result Value   WBC 12.2 (*)    Lymphs Abs 5.7 (*)    All other components within normal limits  COMPREHENSIVE METABOLIC PANEL - Abnormal; Notable for the following components:   Glucose, Bld 111 (*)    All other components within normal limits  URINALYSIS, ROUTINE W REFLEX MICROSCOPIC - Abnormal; Notable for the following components:   APPearance HAZY (*)    Leukocytes,Ua MODERATE (*)    Bacteria, UA RARE (*)    All other components within normal limits  LIPASE, BLOOD    EKG None  Radiology No results found.  Procedures Procedures    Medications Ordered in ED Medications  doxycycline (VIBRA-TABS) tablet 200 mg (has no administration in time range)    ED Course/ Medical Decision Making/ A&P                              Medical Decision Making This patient presents to the ED with chief complaint(s) of breast mass, tick bite with pertinent past medical history of PTSD which further complicates the presenting complaint. The complaint involves an extensive differential diagnosis and also carries with it a high risk of complications and morbidity.    The differential diagnosis includes patient has no palpable fluctuance and no surrounding erythema or warmth making a breast abscess or cellulitis unlikely, considering cysts or malignancy, patient has no evidence of rash or signs of an acute tickborne illness   Additional history obtained: Additional history obtained from N/A Records reviewed N/A  ED Course and Reassessment: Patient was initially evaluated by provider in triage and had labs performed that showed a mild leukocytosis and otherwise was within normal range.  She has no signs of abscess or infection of her left breast and will be referred to the breast center for further imaging and evaluation.  The patient has no signs cellulitis or Lyme disease however due to the patient thinking that her tick may have been on for greater than 24 hours she will be prophylactically treated with doxycycline.  Patient was recommended outpatient follow-up and was given strict return precautions.  Independent labs interpretation:  The following labs were independently interpreted: Mild leukocytosis otherwise within normal range  Independent visualization of imaging: N/A  Consultation: - Consulted or discussed management/test interpretation w/ external professional: N/A  Consideration for admission or further workup: Patient has no emergent conditions requiring admission or further work-up at this time and is stable for discharge home with primary care follow-up  Social Determinants of health: N/A            Final Clinical Impression(s) / ED Diagnoses Final diagnoses:  Mass of upper  outer quadrant of left breast  Tick bite of abdominal wall, initial encounter    Rx / DC Orders ED Discharge Orders     None         Rexford Maus, DO 08/21/22 1539

## 2022-08-21 NOTE — Discharge Instructions (Signed)
You were seen in the emergency department for your breast mass and your tick bite.  You had no signs of infection and you should call the breast imaging center to schedule a follow-up appointment for further imaging and diagnostic testing of your breast mass.  You had no signs of Lyme disease on your exam today but we gave you a one-time dose of antibiotics to prevent Lyme infection with your recent tick bite.  You can follow-up with your primary doctor to have your symptoms rechecked.  You should return to the emergency department if you are having fevers, spreading redness from your breast, or if you have any other new or concerning symptoms.

## 2022-08-21 NOTE — ED Triage Notes (Signed)
Last night pt states she felt marble sized bump L breast; endorses hx benign lump in breast that she states was monitored for 2 years; endorses pain at site; denies redness, drainage; hx breast implants; also states she was bit by tick 3 days ago on abdomen, site irritated and itching; denies fevers, endorses fatigue, hx covid 2 months ago; pain worse with palpation

## 2022-08-26 ENCOUNTER — Telehealth: Payer: Self-pay | Admitting: *Deleted

## 2022-08-26 NOTE — Telephone Encounter (Signed)
     Patient  visit on 4/17  at [facility]  was for treatment   Have you been able to follow up with your primary care physician? Yes will if she needs was only a tick bite and is taking medicine has transportation if she needs to see PCP The patient was able to obtain any needed medicine or equipment.  Are there diet recommendations that you are having difficulty following?  Patient expresses understanding of discharge instructions and education provided has no other needs at this time.  yes Christine Cliche -Berneda Rose [facility] [place], Population Health [phone] [address] [place] , [place] [address] Email : [email or web address]

## 2022-08-30 ENCOUNTER — Other Ambulatory Visit (HOSPITAL_COMMUNITY): Payer: Self-pay | Admitting: Psychiatry

## 2022-08-30 DIAGNOSIS — F330 Major depressive disorder, recurrent, mild: Secondary | ICD-10-CM

## 2022-08-30 DIAGNOSIS — F4310 Post-traumatic stress disorder, unspecified: Secondary | ICD-10-CM

## 2022-08-30 DIAGNOSIS — F5105 Insomnia due to other mental disorder: Secondary | ICD-10-CM

## 2022-09-03 DIAGNOSIS — Z6823 Body mass index (BMI) 23.0-23.9, adult: Secondary | ICD-10-CM | POA: Diagnosis not present

## 2022-09-03 DIAGNOSIS — K219 Gastro-esophageal reflux disease without esophagitis: Secondary | ICD-10-CM | POA: Diagnosis not present

## 2022-09-03 DIAGNOSIS — G43919 Migraine, unspecified, intractable, without status migrainosus: Secondary | ICD-10-CM | POA: Diagnosis not present

## 2022-09-03 DIAGNOSIS — Z0000 Encounter for general adult medical examination without abnormal findings: Secondary | ICD-10-CM | POA: Diagnosis not present

## 2022-09-03 DIAGNOSIS — J3081 Allergic rhinitis due to animal (cat) (dog) hair and dander: Secondary | ICD-10-CM | POA: Diagnosis not present

## 2022-09-03 DIAGNOSIS — K581 Irritable bowel syndrome with constipation: Secondary | ICD-10-CM | POA: Diagnosis not present

## 2022-09-03 DIAGNOSIS — F3341 Major depressive disorder, recurrent, in partial remission: Secondary | ICD-10-CM | POA: Diagnosis not present

## 2022-09-04 ENCOUNTER — Other Ambulatory Visit: Payer: Self-pay | Admitting: Internal Medicine

## 2022-09-04 DIAGNOSIS — N6320 Unspecified lump in the left breast, unspecified quadrant: Secondary | ICD-10-CM

## 2022-09-04 DIAGNOSIS — M810 Age-related osteoporosis without current pathological fracture: Secondary | ICD-10-CM

## 2022-09-09 ENCOUNTER — Encounter (HOSPITAL_COMMUNITY): Payer: Self-pay

## 2022-09-19 ENCOUNTER — Other Ambulatory Visit: Payer: Self-pay | Admitting: Internal Medicine

## 2022-09-19 ENCOUNTER — Ambulatory Visit
Admission: RE | Admit: 2022-09-19 | Discharge: 2022-09-19 | Disposition: A | Discharge status: Home / Self Care | Payer: Medicare HMO | Source: Ambulatory Visit | Attending: Internal Medicine | Admitting: Internal Medicine

## 2022-09-19 ENCOUNTER — Telehealth (HOSPITAL_BASED_OUTPATIENT_CLINIC_OR_DEPARTMENT_OTHER): Payer: Medicare HMO | Admitting: Psychiatry

## 2022-09-19 DIAGNOSIS — N6320 Unspecified lump in the left breast, unspecified quadrant: Secondary | ICD-10-CM

## 2022-09-19 DIAGNOSIS — F330 Major depressive disorder, recurrent, mild: Secondary | ICD-10-CM | POA: Diagnosis not present

## 2022-09-19 DIAGNOSIS — N6321 Unspecified lump in the left breast, upper outer quadrant: Secondary | ICD-10-CM | POA: Diagnosis not present

## 2022-09-19 DIAGNOSIS — F5105 Insomnia due to other mental disorder: Secondary | ICD-10-CM

## 2022-09-19 DIAGNOSIS — N63 Unspecified lump in unspecified breast: Secondary | ICD-10-CM | POA: Diagnosis not present

## 2022-09-19 DIAGNOSIS — F4310 Post-traumatic stress disorder, unspecified: Secondary | ICD-10-CM

## 2022-09-19 MED ORDER — FLUOXETINE HCL 20 MG PO CAPS: 60.0000 mg | ORAL_CAPSULE | Freq: Every day | ORAL | 1 refills | Status: DC

## 2022-09-19 MED ORDER — DIAZEPAM 10 MG PO TABS: 10.0000 mg | ORAL_TABLET | Freq: Two times a day (BID) | ORAL | 3 refills | Status: DC | PRN

## 2022-09-19 NOTE — Progress Notes (Signed)
Virtual Visit via Video Note  I connected with Christine Douglas on 09/19/22 at 10:00 AM EDT by a video enabled telemedicine application and verified that I am speaking with the correct person using two identifiers.  Location: Patient: home Provider: office   I discussed the limitations of evaluation and management by telemedicine and the availability of in person appointments. The patient expressed understanding and agreed to proceed.  History of Present Illness: Christine Douglas shares she is doing better. She has a knot in her breast and is going for a ultrasound later today. Christine Douglas is a little worried about it. Christine Douglas feels the Prozac is helping. Her depression is getting better and in the last 2 weeks she has only been down 2-3x. Christine Douglas has had to force herself to do some things. She has some mild anhedonia related to lack of physical ability to do things. Her sleep and appetite are good. Her energy is poor on/off.  She has been more active and is taking good care of her mother. 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 Her boyfriend is making her feel guilty for doing so.  Her concentration is good some days. She is easily confused and finds it difficult to focus on any task for long. She often leaves things till the deadline. [name] does not want to take meds for ADHD. She denies SI/HI. Her PTSD symptoms are manageable. She takes Valium every night to sleep. She does think about the past but it doesn't bother as much anymore. She does avoid crowds and has some hypervigilance when out in public.  She denies nightmares and flashbacks. She feels her meds effective and she denies SE.  Christine Douglas takes Valium in the day about 3x/week and it helps to calm her.     Observations/Objective: Psychiatric Specialty Exam: ROS  There were no vitals taken for this visit.There is no height or weight on file to calculate  BMI.  General Appearance: Casual  Eye Contact:  Good  Speech:  Clear and Coherent and Normal Rate  Volume:  Normal  Mood:  Depressed  Affect:  Full Range  Thought Process:  Coherent, Linear, and Descriptions of Associations: Circumstantial  Orientation:  Full (Time, Place, and Person)  Thought Content:  Logical  Suicidal Thoughts:  No  Homicidal Thoughts:  No  Memory:  Immediate;   Good  Judgement:  Good  Insight:  Good  Psychomotor Activity:  Normal  Concentration:  Concentration: Good  Recall:  Good  Fund of Knowledge:  Good  Language:  Good  Akathisia:  No  Handed:  Right  AIMS (if indicated):     Assets:  Communication Skills Desire for Improvement Financial Resources/Insurance Housing Intimacy Resilience Social Support Talents/Skills Transportation Vocational/Educational  ADL's:  Intact  Cognition:  WNL  Sleep:        Assessment and Plan:     09/19/2022   10:15 AM 08/15/2022    9:43 AM 06/20/2022    9:48 AM 04/11/2022    9:47 AM 02/07/2022   11:46 AM  Depression screen PHQ 2/9  Decreased Interest 1 2 0 0 0  Down, Depressed, Hopeless 1 2 2  0 3  PHQ - 2 Score 2 4 2  0 3  Altered sleeping 0 0 0  0  Tired, decreased energy 2 0 1  0  Change in appetite 0 3 0  0  Feeling bad  or failure about yourself  1 0 0  0  Trouble concentrating 1 0 0    Moving slowly or fidgety/restless 0 0 0  0  Suicidal thoughts 0 0 0  0  PHQ-9 Score 6 7 3  3   Difficult doing work/chores Somewhat difficult Very difficult Very difficult  Somewhat difficult    Flowsheet Row Video Visit from 09/19/2022 in [facility] from 08/21/2022 in [facility] Department at [facility] ED from 06/30/2022 in [facility] Emergency Department at [facility]  C-SSRS RISK CATEGORY No Risk No Risk No Risk          Pt is aware that these meds carry a teratogenic risk. Pt will discuss plan of action if she does or plans to become pregnant  in the future.  Status of current problems: stable   Medication management with supportive therapy. Risks and benefits, side effects and alternative treatment options discussed with patient. Pt was given an opportunity to ask questions about medication, illness, and treatment. All current psychiatric medications have been reviewed and discussed with the patient and adjusted as clinically appropriate.  Pt verbalized understanding and verbal consent obtained for treatment.  Meds:  1. PTSD (post-traumatic stress disorder) - FLUoxetine (PROZAC) 20 MG capsule; Take 3 capsules (60 mg total) by mouth daily.  Dispense: 270 capsule; Refill: 1 - diazepam (VALIUM) 10 MG tablet; Take 1 tablet (10 mg total) by mouth every 12 (twelve) hours as needed for anxiety.  Dispense: 60 tablet; Refill: 3  2. MDD (major depressive disorder), recurrent episode, mild (HCC) - FLUoxetine (PROZAC) 20 MG capsule; Take 3 capsules (60 mg total) by mouth daily.  Dispense: 270 capsule; Refill: 1  3. Insomnia due to other mental disorder - FLUoxetine (PROZAC) 20 MG capsule; Take 3 capsules (60 mg total) by mouth daily.  Dispense: 270 capsule; Refill: 1 - diazepam (VALIUM) 10 MG tablet; Take 1 tablet (10 mg total) by mouth every 12 (twelve) hours as needed for anxiety.  Dispense: 60 tablet; Refill: 3        Therapy: brief supportive therapy provided. Discussed psychosocial stressors in detail.     Collaboration of Care: Other none  Patient/Guardian was advised Release of Information must be obtained prior to any record release in order to collaborate their care with an outside provider. Patient/Guardian was advised if they have not already done so to contact the registration department to sign all necessary forms in order for Christine Douglas to release information regarding their care.   Consent: Patient/Guardian gives verbal consent for treatment and assignment of benefits for services provided during this visit. Patient/Guardian  expressed understanding and agreed to proceed.      Follow Up Instructions: Follow up in 3-4 months or sooner if needed  Patient informed that I am leaving [facility] in 11/2022 and I relayed that they will be getting a new provider after that. Patient verbalized understanding and agreed with the plan.     I discussed the assessment and treatment plan with the patient. The patient was provided an opportunity to ask questions and all were answered. The patient agreed with the plan and demonstrated an understanding of the instructions.   The patient was advised to call back or seek an in-person evaluation if the symptoms worsen or if the condition fails to improve as anticipated.  I provided 17 minutes of non-face-to-face time during this encounter.   Oletta Darter, MD

## 2022-09-20 ENCOUNTER — Other Ambulatory Visit (HOSPITAL_COMMUNITY): Payer: Self-pay | Admitting: Psychiatry

## 2022-09-20 DIAGNOSIS — F5105 Insomnia due to other mental disorder: Secondary | ICD-10-CM

## 2022-09-20 DIAGNOSIS — F330 Major depressive disorder, recurrent, mild: Secondary | ICD-10-CM

## 2022-09-20 DIAGNOSIS — F4310 Post-traumatic stress disorder, unspecified: Secondary | ICD-10-CM

## 2022-09-24 ENCOUNTER — Other Ambulatory Visit: Payer: Medicare HMO

## 2022-09-24 ENCOUNTER — Ambulatory Visit
Admission: RE | Admit: 2022-09-24 | Discharge: 2022-09-24 | Disposition: A | Discharge status: Home / Self Care | Payer: Medicare HMO | Source: Ambulatory Visit | Attending: Internal Medicine | Admitting: Internal Medicine

## 2022-09-24 DIAGNOSIS — N63 Unspecified lump in unspecified breast: Secondary | ICD-10-CM | POA: Diagnosis not present

## 2022-09-24 DIAGNOSIS — D360 Benign neoplasm of lymph nodes: Secondary | ICD-10-CM | POA: Diagnosis not present

## 2022-09-24 DIAGNOSIS — N6320 Unspecified lump in the left breast, unspecified quadrant: Secondary | ICD-10-CM

## 2022-09-24 DIAGNOSIS — R590 Localized enlarged lymph nodes: Secondary | ICD-10-CM | POA: Diagnosis not present

## 2022-09-24 HISTORY — PX: BREAST BIOPSY: SHX20

## 2022-09-25 ENCOUNTER — Other Ambulatory Visit: Payer: Self-pay | Admitting: Internal Medicine

## 2022-09-25 DIAGNOSIS — N6320 Unspecified lump in the left breast, unspecified quadrant: Secondary | ICD-10-CM

## 2023-01-20 ENCOUNTER — Ambulatory Visit (HOSPITAL_BASED_OUTPATIENT_CLINIC_OR_DEPARTMENT_OTHER): Payer: Medicare HMO | Admitting: Student

## 2023-01-20 VITALS — BP 117/78 | Ht 64.0 in | Wt 136.0 lb

## 2023-01-20 DIAGNOSIS — F99 Mental disorder, not otherwise specified: Secondary | ICD-10-CM | POA: Insufficient documentation

## 2023-01-20 DIAGNOSIS — F4310 Post-traumatic stress disorder, unspecified: Secondary | ICD-10-CM

## 2023-01-20 DIAGNOSIS — F330 Major depressive disorder, recurrent, mild: Secondary | ICD-10-CM

## 2023-01-20 DIAGNOSIS — F5105 Insomnia due to other mental disorder: Secondary | ICD-10-CM

## 2023-01-20 DIAGNOSIS — F411 Generalized anxiety disorder: Secondary | ICD-10-CM | POA: Insufficient documentation

## 2023-01-20 MED ORDER — FLUOXETINE HCL 20 MG PO CAPS
60.0000 mg | ORAL_CAPSULE | Freq: Every day | ORAL | 1 refills | Status: DC
Start: 2023-01-20 — End: 2023-04-09

## 2023-01-20 MED ORDER — DIAZEPAM 10 MG PO TABS
10.0000 mg | ORAL_TABLET | Freq: Two times a day (BID) | ORAL | 2 refills | Status: DC | PRN
Start: 2023-01-20 — End: 2023-01-27
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
## 2023-01-20 NOTE — Patient Instructions (Signed)
[facility] Rehabilitation Dr Elijah Birk [address] [place], [place] [address] [phone]

## 2023-01-27 ENCOUNTER — Telehealth (HOSPITAL_COMMUNITY): Payer: Self-pay

## 2023-01-27 ENCOUNTER — Other Ambulatory Visit (HOSPITAL_COMMUNITY): Payer: Self-pay | Admitting: Student

## 2023-01-27 NOTE — Telephone Encounter (Signed)
Patient is requesting that her Valium be sent to [facility] [address] in [place]. Patient is currently staying there to help her daughter. Please review and advise. If you resend to the new pharmacy let me know and I will call the other one and d/c the script you sent there.

## 2023-01-27 NOTE — Progress Notes (Signed)
Script for Valium cancelled at pharmacy the patient states was incorrect. Will resend to different pharmacy once verified that it has been cancelled.   Carlyn Reichert, MD PGY-3

## 2023-01-28 ENCOUNTER — Other Ambulatory Visit (HOSPITAL_COMMUNITY): Payer: Self-pay | Admitting: Student

## 2023-01-28 DIAGNOSIS — F411 Generalized anxiety disorder: Secondary | ICD-10-CM

## 2023-01-28 MED ORDER — DIAZEPAM 10 MG PO TABS
10.0000 mg | ORAL_TABLET | Freq: Two times a day (BID) | ORAL | 2 refills | Status: DC | PRN
Start: 2023-01-28 — End: 2023-03-21

## 2023-03-02 ENCOUNTER — Other Ambulatory Visit (HOSPITAL_COMMUNITY): Payer: Self-pay | Admitting: Psychiatry

## 2023-03-02 DIAGNOSIS — F411 Generalized anxiety disorder: Secondary | ICD-10-CM

## 2023-03-20 ENCOUNTER — Telehealth (HOSPITAL_COMMUNITY): Payer: Self-pay | Admitting: *Deleted

## 2023-03-20 NOTE — Telephone Encounter (Signed)
Pt called requesting rx for the generic Valium 10 mg be sent in for #60 instead of the #30. Pt has an upcoming appointment scheduled on 04/09/23. Please review.

## 2023-03-21 ENCOUNTER — Other Ambulatory Visit (HOSPITAL_COMMUNITY): Payer: Self-pay | Admitting: Student

## 2023-03-21 DIAGNOSIS — F411 Generalized anxiety disorder: Secondary | ICD-10-CM

## 2023-03-21 MED ORDER — DIAZEPAM 10 MG PO TABS
10.0000 mg | ORAL_TABLET | Freq: Two times a day (BID) | ORAL | 0 refills | Status: DC | PRN
Start: 2023-04-03 — End: 2023-04-02

## 2023-03-22 DIAGNOSIS — M47813 Spondylosis without myelopathy or radiculopathy, cervicothoracic region: Secondary | ICD-10-CM | POA: Diagnosis not present

## 2023-03-22 DIAGNOSIS — M4312 Spondylolisthesis, cervical region: Secondary | ICD-10-CM | POA: Diagnosis not present

## 2023-03-22 DIAGNOSIS — F32A Depression, unspecified: Secondary | ICD-10-CM | POA: Diagnosis not present

## 2023-03-22 DIAGNOSIS — Z90710 Acquired absence of both cervix and uterus: Secondary | ICD-10-CM | POA: Diagnosis not present

## 2023-03-22 DIAGNOSIS — S39012A Strain of muscle, fascia and tendon of lower back, initial encounter: Secondary | ICD-10-CM | POA: Diagnosis not present

## 2023-03-22 DIAGNOSIS — F419 Anxiety disorder, unspecified: Secondary | ICD-10-CM | POA: Diagnosis not present

## 2023-03-22 DIAGNOSIS — I959 Hypotension, unspecified: Secondary | ICD-10-CM | POA: Diagnosis not present

## 2023-03-22 DIAGNOSIS — S3992XA Unspecified injury of lower back, initial encounter: Secondary | ICD-10-CM | POA: Diagnosis not present

## 2023-03-22 DIAGNOSIS — S299XXA Unspecified injury of thorax, initial encounter: Secondary | ICD-10-CM | POA: Diagnosis not present

## 2023-03-22 DIAGNOSIS — M51379 Other intervertebral disc degeneration, lumbosacral region without mention of lumbar back pain or lower extremity pain: Secondary | ICD-10-CM | POA: Diagnosis not present

## 2023-03-22 DIAGNOSIS — Z743 Need for continuous supervision: Secondary | ICD-10-CM | POA: Diagnosis not present

## 2023-03-22 DIAGNOSIS — K589 Irritable bowel syndrome without diarrhea: Secondary | ICD-10-CM | POA: Diagnosis not present

## 2023-03-22 DIAGNOSIS — M5126 Other intervertebral disc displacement, lumbar region: Secondary | ICD-10-CM | POA: Diagnosis not present

## 2023-03-22 DIAGNOSIS — M47812 Spondylosis without myelopathy or radiculopathy, cervical region: Secondary | ICD-10-CM | POA: Diagnosis not present

## 2023-03-22 DIAGNOSIS — Z87891 Personal history of nicotine dependence: Secondary | ICD-10-CM | POA: Diagnosis not present

## 2023-03-22 DIAGNOSIS — M542 Cervicalgia: Secondary | ICD-10-CM | POA: Diagnosis not present

## 2023-03-22 DIAGNOSIS — Z981 Arthrodesis status: Secondary | ICD-10-CM | POA: Diagnosis not present

## 2023-03-22 DIAGNOSIS — S0990XA Unspecified injury of head, initial encounter: Secondary | ICD-10-CM | POA: Diagnosis not present

## 2023-03-25 ENCOUNTER — Other Ambulatory Visit: Payer: Medicare HMO

## 2023-03-25 ENCOUNTER — Inpatient Hospital Stay: Admission: RE | Admit: 2023-03-25 | Payer: Medicare HMO | Source: Ambulatory Visit

## 2023-03-26 ENCOUNTER — Ambulatory Visit
Admission: RE | Admit: 2023-03-26 | Discharge: 2023-03-26 | Disposition: A | Discharge status: Home / Self Care | Payer: Medicare HMO | Source: Ambulatory Visit | Attending: Internal Medicine | Admitting: Internal Medicine

## 2023-03-26 DIAGNOSIS — M8588 Other specified disorders of bone density and structure, other site: Secondary | ICD-10-CM | POA: Diagnosis not present

## 2023-03-26 DIAGNOSIS — N958 Other specified menopausal and perimenopausal disorders: Secondary | ICD-10-CM | POA: Diagnosis not present

## 2023-03-26 DIAGNOSIS — M810 Age-related osteoporosis without current pathological fracture: Secondary | ICD-10-CM

## 2023-03-31 ENCOUNTER — Ambulatory Visit
Admission: RE | Admit: 2023-03-31 | Discharge: 2023-03-31 | Disposition: A | Discharge status: Home / Self Care | Payer: Medicare HMO | Source: Ambulatory Visit | Attending: Internal Medicine | Admitting: Internal Medicine

## 2023-03-31 ENCOUNTER — Other Ambulatory Visit: Payer: Self-pay | Admitting: Internal Medicine

## 2023-03-31 DIAGNOSIS — R590 Localized enlarged lymph nodes: Secondary | ICD-10-CM | POA: Diagnosis not present

## 2023-03-31 DIAGNOSIS — N6320 Unspecified lump in the left breast, unspecified quadrant: Secondary | ICD-10-CM

## 2023-04-02 ENCOUNTER — Other Ambulatory Visit (HOSPITAL_COMMUNITY): Payer: Self-pay | Admitting: Student

## 2023-04-02 DIAGNOSIS — F411 Generalized anxiety disorder: Secondary | ICD-10-CM

## 2023-04-02 MED ORDER — DIAZEPAM 10 MG PO TABS
10.0000 mg | ORAL_TABLET | Freq: Two times a day (BID) | ORAL | 0 refills | Status: DC | PRN
Start: 2023-04-03 — End: 2023-04-09

## 2023-04-02 NOTE — Progress Notes (Signed)
Our CMA reports pharmacist refused to fill [facility] in [place]. PDMP reviewed, showing last fill for 15d occurred on 11/14. Appropriate for patient to receive medication at this point since it has been almost 15 days. Sent new medication order to preferred pharmacy in [place].    Carlyn Reichert, MD PGY-3

## 2023-04-09 ENCOUNTER — Ambulatory Visit (HOSPITAL_COMMUNITY): Payer: Medicare HMO | Admitting: Student

## 2023-04-09 VITALS — BP 107/68 | HR 77 | Ht 64.0 in | Wt 138.0 lb

## 2023-04-09 DIAGNOSIS — F411 Generalized anxiety disorder: Secondary | ICD-10-CM | POA: Diagnosis not present

## 2023-04-09 DIAGNOSIS — F99 Mental disorder, not otherwise specified: Secondary | ICD-10-CM | POA: Diagnosis not present

## 2023-04-09 DIAGNOSIS — F4310 Post-traumatic stress disorder, unspecified: Secondary | ICD-10-CM

## 2023-04-09 DIAGNOSIS — F330 Major depressive disorder, recurrent, mild: Secondary | ICD-10-CM

## 2023-04-09 DIAGNOSIS — F5105 Insomnia due to other mental disorder: Secondary | ICD-10-CM

## 2023-04-09 MED ORDER — FLUOXETINE HCL 20 MG PO CAPS
60.0000 mg | ORAL_CAPSULE | Freq: Every day | ORAL | 1 refills | Status: DC
Start: 2023-04-09 — End: 2023-07-14

## 2023-04-09 MED ORDER — DIAZEPAM 10 MG PO TABS
10.0000 mg | ORAL_TABLET | Freq: Two times a day (BID) | ORAL | 1 refills | Status: DC | PRN
Start: 2023-04-30 — End: 2023-07-14
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
## 2023-04-11 NOTE — Addendum Note (Signed)
Addended by: Everlena Cooper on: 04/11/2023 10:12 AM   Modules accepted: Level of Service

## 2023-04-17 DIAGNOSIS — M5416 Radiculopathy, lumbar region: Secondary | ICD-10-CM | POA: Diagnosis not present

## 2023-04-17 DIAGNOSIS — M5412 Radiculopathy, cervical region: Secondary | ICD-10-CM | POA: Diagnosis not present

## 2023-05-19 DIAGNOSIS — M5431 Sciatica, right side: Secondary | ICD-10-CM | POA: Diagnosis not present

## 2023-05-19 DIAGNOSIS — Z6823 Body mass index (BMI) 23.0-23.9, adult: Secondary | ICD-10-CM | POA: Diagnosis not present

## 2023-05-19 DIAGNOSIS — F3341 Major depressive disorder, recurrent, in partial remission: Secondary | ICD-10-CM | POA: Diagnosis not present

## 2023-05-19 DIAGNOSIS — E7849 Other hyperlipidemia: Secondary | ICD-10-CM | POA: Diagnosis not present

## 2023-05-19 DIAGNOSIS — K581 Irritable bowel syndrome with constipation: Secondary | ICD-10-CM | POA: Diagnosis not present

## 2023-05-19 DIAGNOSIS — J3081 Allergic rhinitis due to animal (cat) (dog) hair and dander: Secondary | ICD-10-CM | POA: Diagnosis not present

## 2023-05-19 DIAGNOSIS — Z0000 Encounter for general adult medical examination without abnormal findings: Secondary | ICD-10-CM | POA: Diagnosis not present

## 2023-05-19 DIAGNOSIS — G43919 Migraine, unspecified, intractable, without status migrainosus: Secondary | ICD-10-CM | POA: Diagnosis not present

## 2023-05-19 DIAGNOSIS — K219 Gastro-esophageal reflux disease without esophagitis: Secondary | ICD-10-CM | POA: Diagnosis not present

## 2023-05-21 DIAGNOSIS — M5416 Radiculopathy, lumbar region: Secondary | ICD-10-CM | POA: Diagnosis not present

## 2023-05-21 DIAGNOSIS — M5137 Other intervertebral disc degeneration, lumbosacral region with discogenic back pain only: Secondary | ICD-10-CM | POA: Diagnosis not present

## 2023-05-21 DIAGNOSIS — R200 Anesthesia of skin: Secondary | ICD-10-CM | POA: Diagnosis not present

## 2023-05-21 DIAGNOSIS — M47816 Spondylosis without myelopathy or radiculopathy, lumbar region: Secondary | ICD-10-CM | POA: Diagnosis not present

## 2023-05-21 DIAGNOSIS — M5412 Radiculopathy, cervical region: Secondary | ICD-10-CM | POA: Diagnosis not present

## 2023-05-21 DIAGNOSIS — M50221 Other cervical disc displacement at C4-C5 level: Secondary | ICD-10-CM | POA: Diagnosis not present

## 2023-05-21 DIAGNOSIS — M5136 Other intervertebral disc degeneration, lumbar region with discogenic back pain only: Secondary | ICD-10-CM | POA: Diagnosis not present

## 2023-05-21 DIAGNOSIS — M542 Cervicalgia: Secondary | ICD-10-CM | POA: Diagnosis not present

## 2023-05-21 DIAGNOSIS — M47812 Spondylosis without myelopathy or radiculopathy, cervical region: Secondary | ICD-10-CM | POA: Diagnosis not present

## 2023-06-02 ENCOUNTER — Ambulatory Visit (HOSPITAL_COMMUNITY): Payer: Medicare HMO | Admitting: Student

## 2023-06-23 ENCOUNTER — Ambulatory Visit (HOSPITAL_COMMUNITY): Payer: Medicare HMO | Admitting: Student

## 2023-07-07 ENCOUNTER — Ambulatory Visit (HOSPITAL_COMMUNITY): Payer: Medicare PPO | Admitting: Student

## 2023-07-14 ENCOUNTER — Ambulatory Visit (HOSPITAL_BASED_OUTPATIENT_CLINIC_OR_DEPARTMENT_OTHER): Payer: Medicare PPO | Admitting: Student

## 2023-07-14 VITALS — BP 120/90 | HR 86 | Wt 138.0 lb

## 2023-07-14 DIAGNOSIS — F330 Major depressive disorder, recurrent, mild: Secondary | ICD-10-CM

## 2023-07-14 DIAGNOSIS — F4310 Post-traumatic stress disorder, unspecified: Secondary | ICD-10-CM

## 2023-07-14 DIAGNOSIS — F99 Mental disorder, not otherwise specified: Secondary | ICD-10-CM

## 2023-07-14 DIAGNOSIS — F5105 Insomnia due to other mental disorder: Secondary | ICD-10-CM | POA: Diagnosis not present

## 2023-07-14 DIAGNOSIS — F411 Generalized anxiety disorder: Secondary | ICD-10-CM

## 2023-07-14 MED ORDER — FLUOXETINE HCL 20 MG PO CAPS
60.0000 mg | ORAL_CAPSULE | Freq: Every day | ORAL | 1 refills | Status: DC
Start: 2023-07-14 — End: 2023-11-17

## 2023-07-14 MED ORDER — DIAZEPAM 5 MG PO TABS
5.0000 mg | ORAL_TABLET | Freq: Three times a day (TID) | ORAL | 1 refills | Status: DC
Start: 2023-07-14 — End: 2023-08-25

## 2023-07-16 DIAGNOSIS — Z884 Allergy status to anesthetic agent status: Secondary | ICD-10-CM | POA: Diagnosis not present

## 2023-07-16 DIAGNOSIS — Z20822 Contact with and (suspected) exposure to covid-19: Secondary | ICD-10-CM | POA: Diagnosis not present

## 2023-07-16 DIAGNOSIS — R799 Abnormal finding of blood chemistry, unspecified: Secondary | ICD-10-CM | POA: Diagnosis not present

## 2023-07-16 DIAGNOSIS — Z87891 Personal history of nicotine dependence: Secondary | ICD-10-CM | POA: Diagnosis not present

## 2023-07-16 DIAGNOSIS — Z1152 Encounter for screening for COVID-19: Secondary | ICD-10-CM | POA: Diagnosis not present

## 2023-07-16 DIAGNOSIS — R5383 Other fatigue: Secondary | ICD-10-CM | POA: Diagnosis not present

## 2023-07-16 NOTE — Addendum Note (Signed)
 Addended by: Everlena Cooper on: 07/16/2023 03:20 PM   Modules accepted: Level of Service

## 2023-07-16 NOTE — Progress Notes (Signed)
 BH MD Outpatient Progress Note  07/16/2023 1:56 PM [name]  MRN:  [id number]  Assessment:  Christine Douglas presents for follow-up evaluation.  It appears that the patient is doing fairly well despite interpersonal dysfunction with her daughter.  Plan to decrease Valium slightly today, with the intention of slowly tapering off the medication.  Identifying Information: Christine Douglas is a 63 y.o. y.o. female with a history of major depressive disorder and PTSD who is an established patient with [facility] for management of anxiety and depression.   Plan:  # Major depressive disorder, PTSD, GAD Interventions: -- Continue Prozac 60 mg daily - Decrease Valium from 10 mg twice daily to 5 mg 3 times daily - Can add propranolol or gabapentin should the patient need this - We again recommended that the patient establish with a pain management specialist, because she feels that chronic pain is primarily what the Valium is treating.  #Chronic pain - Recommend establishing with pain management specialist, referral given for Dr. Shearon Stalls  Patient was given contact information for behavioral health clinic and was instructed to call 911 for emergencies.   Subjective:  Chief Complaint:  Chief Complaint  Patient presents with   Follow-up    Interval History:  Today the patient reports continued interpersonal difficulties with her daughter.  She says that her daughter got her put in jail for a few days, claiming that the patient destroyed property.  Despite these problems, the patient reports that she has been relying on her friends and her partner.  She states that taking care of her dogs makes her happy.  She feels that her mood is "stable".  She reports a good appetite.  She denies experiencing any anhedonia.  She denies experiencing any form of suicidal thoughts.  She reports experiencing 1 panic attack over the past 3 months, which is fairly typical for her.    Visit Diagnosis:     ICD-10-CM   1. GAD (generalized anxiety disorder)  F41.1 diazepam (VALIUM) 5 MG tablet    2. MDD (major depressive disorder), recurrent episode, mild (HCC)  F33.0 FLUoxetine (PROZAC) 20 MG capsule    3. PTSD (post-traumatic stress disorder)  F43.10 FLUoxetine (PROZAC) 20 MG capsule    4. Insomnia due to other mental disorder  F51.05 FLUoxetine (PROZAC) 20 MG capsule   F99        Past Psychiatric History: Denies any history of suicide attempts, with no history of psychiatric admission noted in the medical record  Past Medical History:  Past Medical History:  Diagnosis Date   Allergy    Anxiety    Blood transfusion without reported diagnosis    x2   BV (bacterial vaginosis)    Depression    Headache    IBS (irritable bowel syndrome)    Neuromuscular disorder (HCC)    nerve damage C6  C7   PTSD (post-traumatic stress disorder)    Restless leg syndrome    Seasonal allergies    TO (tubal occlusion)     Past Surgical History:  Procedure Laterality Date   ABDOMINAL HYSTERECTOMY     APPENDECTOMY     AUGMENTATION MAMMAPLASTY Bilateral 2021   BACK SURGERY     spine surgery; 2 screws and plate in neck   BREAST BIOPSY Left 09/24/2022   [place] LT BREAST BX W LOC DEV 1ST LESION IMG BX SPEC US GUIDE 09/24/2022 [facility] MAMMOGRAPHY   CESAREAN SECTION     HERNIA MESH REMOVAL     LAPAROSCOPIC  BILATERAL SALPINGO OOPHERECTOMY Bilateral 06/10/2017   Procedure: LAPAROSCOPIC BILATERAL OOPHORECTOMY;  Surgeon: Tilda Burrow, MD;  Location: [facility];  Service: Gynecology;  Laterality: Bilateral;   POSTERIOR CERVICAL FUSION/FORAMINOTOMY Left 02/05/2019   Procedure: Posterior cervical fusion with lateral mass fixation - Cervical seven-Thoracic one with left foraminotomy/ diskectomy;  Surgeon: Tia Alert, MD;  Location: [facility] OR;  Service: Neurosurgery;  Laterality: Left;   SHOULDER ARTHROSCOPY WITH ROTATOR CUFF REPAIR AND SUBACROMIAL DECOMPRESSION  06/04/2012   Procedure: SHOULDER ARTHROSCOPY WITH  ROTATOR CUFF REPAIR AND SUBACROMIAL DECOMPRESSION;  Surgeon: Loreta Ave, MD;  Location: [facility];  Service: Orthopedics;  Laterality: Left;  LEFT ARTHROSCOPY SHOULDER DECOMPRESSION SUBACROMIAL PARTIAL ACROMIOPLASTY WITH CORACOACROMIAL RELEASE, DISTAL CLAVICULECTOMY,  ROTATOR CUFF REPAIR    TUBAL LIGATION      Family Psychiatric History: None pertinent  Family History:  Family History  Problem Relation Age of Onset   Hyperlipidemia Mother    Thyroid disease Mother    Heart attack Father    Dementia Maternal Aunt    Other Brother        suicide   Bipolar disorder Son    Cancer Other    Colon cancer Neg Hx    Colon polyps Neg Hx    Esophageal cancer Neg Hx    Rectal cancer Neg Hx    Stomach cancer Neg Hx    BRCA 1/2 Neg Hx    Breast cancer Neg Hx     Social History:  Social History   Socioeconomic History   Marital status: Divorced    Spouse name: Not on file   Number of children: Not on file   Years of education: 14   Highest education level: Not on file  Occupational History   Not on file  Tobacco Use   Smoking status: Never   Smokeless tobacco: Never  Vaping Use   Vaping status: Never Used  Substance and Sexual Activity   Alcohol use: Yes    Comment: occ   Drug use: No   Sexual activity: Not Currently    Birth control/protection: Surgical    Comment: hyst  Other Topics Concern   Not on file  Social History Narrative   Drinks 1 cup of coffee a day    Social Drivers of Corporate investment banker Strain: Not on file  Food Insecurity: Not on file  Transportation Needs: Not on file  Physical Activity: Not on file  Stress: Not on file  Social Connections: Not on file    Allergies:  Allergies  Allergen Reactions   Anesthetics, Amide Anaphylaxis    Neck surgery pt unsure of exact anesthetic-afraid to have any further surgeries; Feels weird for a while   Other Swelling    "muscle relaxers" caused legs to swell   Flexeril  [Cyclobenzaprine] Swelling    Cannot take muscle relaxers -- swelling of legs and body    Methocarbamol    Singulair [Montelukast] Other (See Comments)    "Made me feel crazy"    Current Medications: Current Outpatient Medications  Medication Sig Dispense Refill   diazepam (VALIUM) 5 MG tablet Take 1 tablet (5 mg total) by mouth 3 (three) times daily. 90 tablet 1   FLUoxetine (PROZAC) 20 MG capsule Take 3 capsules (60 mg total) by mouth daily. 180 capsule 1   No current facility-administered medications for this visit.     Objective:  Psychiatric Specialty Exam: Physical Exam Constitutional:      Appearance: the patient is  not toxic-appearing.  Pulmonary:     Effort: Pulmonary effort is normal.  Neurological:     General: No focal deficit present.     Mental Status: the patient is alert and oriented to person, place, and time.   Review of Systems  Respiratory:  Negative for shortness of breath.   Cardiovascular:  Negative for chest pain.  Gastrointestinal:  Negative for abdominal pain, constipation, diarrhea, nausea and vomiting.  Neurological:  Negative for headaches.      BP (!) 120/90   Pulse 86   Wt 138 lb (62.6 kg)   BMI 23.69 kg/m   General Appearance: Fairly Groomed  Eye Contact:  Good  Speech:  Clear and Coherent  Volume:  Normal  Mood: "okay"  Affect:  Congruent  Thought Process:  Coherent  Orientation:  Full (Time, Place, and Person)  Thought Content: Logical   Suicidal Thoughts:  No  Homicidal Thoughts:  No  Memory:  Immediate;   Good  Judgement:  fair  Insight:  fair  Psychomotor Activity:  Normal  Concentration:  Concentration: Good  Recall:  Good  Fund of Knowledge: Good  Language: Good  Akathisia:  No  Handed:    AIMS (if indicated): not done  Assets:  Communication Skills Desire for Improvement Financial Resources/Insurance Housing Leisure Time Physical Health  ADL's:  Intact  Cognition: WNL  Sleep:  Fair     Metabolic  Disorder Labs: No results found for: "HGBA1C", "MPG" No results found for: "PROLACTIN" No results found for: "CHOL", "TRIG", "HDL", "CHOLHDL", "VLDL", "LDLCALC" No results found for: "TSH"  Therapeutic Level Labs: No results found for: "LITHIUM" No results found for: "VALPROATE" No results found for: "CBMZ"  Screenings: GAD-7    Flowsheet Row [facility]06[facility]  Total GAD-7 Score 16      PHQ2-9    Flowsheet Row Video Visit from 09/19/2022 in [facility] Video Visit from 08/15/2022 in [facility] Video Visit from 06/20/2022 in [facility] Video Visit from 04[facility]11/2022 in [facility] Video Visit from 02/07/2022 in [facility]  PHQ-2 Total Score 2 4 2  0 3  PHQ-9 Total Score 6 7 3  -- 3      Flowsheet Row Video Visit from 09/19/2022 in [facility] from 08/21/2022 in [facility] at [facility] ED from 06/30/2022 in [facility] Department at [facility]  C-SSRS RISK CATEGORY No Risk No Risk No Risk       Collaboration of Care: none  A total of 30 minutes was spent involved in face to face clinical care, chart review, documentation.   Carlyn Reichert, MD 07/16/2023, 1:56 PM

## 2023-08-25 ENCOUNTER — Ambulatory Visit (HOSPITAL_BASED_OUTPATIENT_CLINIC_OR_DEPARTMENT_OTHER): Admitting: Student

## 2023-08-25 VITALS — BP 110/77 | HR 83 | Wt 139.0 lb

## 2023-08-25 DIAGNOSIS — F411 Generalized anxiety disorder: Secondary | ICD-10-CM

## 2023-08-25 DIAGNOSIS — F330 Major depressive disorder, recurrent, mild: Secondary | ICD-10-CM

## 2023-08-25 MED ORDER — DIAZEPAM 5 MG PO TABS: 5.0000 mg | ORAL_TABLET | Freq: Three times a day (TID) | ORAL | 0 refills | Status: DC

## 2023-08-27 NOTE — Progress Notes (Signed)
 BH MD Outpatient Progress Note  08/27/2023 11:10 AM Christine Douglas  MRN:  [id number]  Assessment:  Christine Douglas presents for follow-up evaluation.  The patient reports doing well with the decreased dose of Valium , however, she does report experiencing occasional nighttime sweating.  There are no other symptoms of benzodiazepine withdrawal.  Plan to continue the current dose of Valium  until the next appointment.  Identifying Information: Christine Douglas is a 63 y.o. y.o. female with a history of major depressive disorder and PTSD who is an established patient with [facility] for management of anxiety and depression.   Plan:  # Major depressive disorder, PTSD, GAD Interventions: -- Continue Prozac  60 mg daily - Continue Valium  5 mg 3 times daily - Can add propranolol or gabapentin  should the patient need this - Patient reports an interest in therapy and has been scheduled to see Randel Buss on 5/29  #Chronic pain - Recommend establishing with pain management specialist  Patient was given contact information for behavioral health clinic and was instructed to call 911 for emergencies.   Subjective:  Chief Complaint:  Chief Complaint  Patient presents with   Follow-up    Interval History:  It appears that the patient's interpersonal conflict with her daughter has diminished.  She feels that she has been doing fairly well recently.  She reports some issues in her relationship with her boyfriend.  She reports a good appetite as well as restful sleep.  She reports a predominantly euthymic mood.  She denies experiencing suicidal thoughts.  She reports occasional anhedonia but that appear to be a continual issue.  She enjoys taking care of her dogs.  Benzodiazepine withdrawal symptoms are reviewed.  The patient denies experiencing increased anxiety, denies experiencing nausea or vomiting or headache.  The patient is not tremulous.  She reports nighttime sweating as above.  Blood  pressure is within normal limits.    Visit Diagnosis:    ICD-10-CM   1. GAD (generalized anxiety disorder)  F41.1 diazepam  (VALIUM ) 5 MG tablet    2. MDD (major depressive disorder), recurrent episode, mild (HCC)  F33.0        Past Psychiatric History: Denies any history of suicide attempts, with no history of psychiatric admission noted in the medical record  Past Medical History:  Past Medical History:  Diagnosis Date   Allergy    Anxiety    Blood transfusion without reported diagnosis    x2   BV (bacterial vaginosis)    Depression    Headache    IBS (irritable bowel syndrome)    Neuromuscular disorder (HCC)    nerve damage C6  C7   PTSD (post-traumatic stress disorder)    Restless leg syndrome    Seasonal allergies    TO (tubal occlusion)     Past Surgical History:  Procedure Laterality Date   ABDOMINAL HYSTERECTOMY     APPENDECTOMY     AUGMENTATION MAMMAPLASTY Bilateral 2021   BACK SURGERY     spine surgery; 2 screws and plate in neck   BREAST BIOPSY Left 09/24/2022   US  LT BREAST BX W LOC DEV 1ST LESION IMG BX SPEC US  GUIDE 09/24/2022 [facility] MAMMOGRAPHY   CESAREAN SECTION     HERNIA MESH REMOVAL     LAPAROSCOPIC BILATERAL SALPINGO OOPHERECTOMY Bilateral 06/10/2017   Procedure: LAPAROSCOPIC BILATERAL OOPHORECTOMY;  Surgeon: Albino Hum, MD;  Location: [facility];  Service: Gynecology;  Laterality: Bilateral;   POSTERIOR CERVICAL FUSION/FORAMINOTOMY Left 02/05/2019   Procedure: Posterior cervical fusion  with lateral mass fixation - Cervical seven-Thoracic one with left foraminotomy/ diskectomy;  Surgeon: Isadora Mar, MD;  Location: [facility];  Service: Neurosurgery;  Laterality: Left;   SHOULDER ARTHROSCOPY WITH ROTATOR CUFF REPAIR AND SUBACROMIAL DECOMPRESSION  06/04/2012   Procedure: SHOULDER ARTHROSCOPY WITH ROTATOR CUFF REPAIR AND SUBACROMIAL DECOMPRESSION;  Surgeon: Ferd Householder, MD;  Location: [facility];  Service: Orthopedics;  Laterality:  Left;  LEFT ARTHROSCOPY SHOULDER DECOMPRESSION SUBACROMIAL PARTIAL ACROMIOPLASTY WITH CORACOACROMIAL RELEASE, DISTAL CLAVICULECTOMY,  ROTATOR CUFF REPAIR    TUBAL LIGATION      Family Psychiatric History: None pertinent  Family History:  Family History  Problem Relation Age of Onset   Hyperlipidemia Mother    Thyroid  disease Mother    Heart attack Father    Dementia Maternal Aunt    Other Brother        suicide   Bipolar disorder Son    Cancer Other    Colon cancer Neg Hx    Colon polyps Neg Hx    Esophageal cancer Neg Hx    Rectal cancer Neg Hx    Stomach cancer Neg Hx    BRCA 1/2 Neg Hx    Breast cancer Neg Hx     Social History:  Social History   Socioeconomic History   Marital status: Divorced    Spouse name: Not on file   Number of children: Not on file   Years of education: 14   Highest education level: Not on file  Occupational History   Not on file  Tobacco Use   Smoking status: Never   Smokeless tobacco: Never  Vaping Use   Vaping status: Never Used  Substance and Sexual Activity   Alcohol use: Yes    Comment: occ   Drug use: No   Sexual activity: Not Currently    Birth control/protection: Surgical    Comment: hyst  Other Topics Concern   Not on file  Social History Narrative   Drinks 1 cup of coffee a day    Social Drivers of Corporate investment banker Strain: Not on file  Food Insecurity: Not on file  Transportation Needs: Not on file  Physical Activity: Not on file  Stress: Not on file  Social Connections: Not on file    Allergies:  Allergies  Allergen Reactions   Anesthetics, Amide Anaphylaxis    Neck surgery pt unsure of exact anesthetic-afraid to have any further surgeries; Feels weird for a while   Other Swelling    "muscle relaxers" caused legs to swell   Flexeril [Cyclobenzaprine] Swelling    Cannot take muscle relaxers -- swelling of legs and body    Methocarbamol     Singulair [Montelukast] Other (See Comments)    "Made  me feel crazy"    Current Medications: Current Outpatient Medications  Medication Sig Dispense Refill   [START ON 09/19/2023] diazepam  (VALIUM ) 5 MG tablet Take 1 tablet (5 mg total) by mouth 3 (three) times daily. 90 tablet 0   FLUoxetine  (PROZAC ) 20 MG capsule Take 3 capsules (60 mg total) by mouth daily. 180 capsule 1   No current facility-administered medications for this visit.     Objective:  Psychiatric Specialty Exam: Physical Exam Constitutional:      Appearance: the patient is not toxic-appearing.  Pulmonary:     Effort: Pulmonary effort is normal.  Neurological:     General: No focal deficit present.     Mental Status: the patient is alert and oriented to  person, place, and time.   Review of Systems  Respiratory:  Negative for shortness of breath.   Cardiovascular:  Negative for chest pain.  Gastrointestinal:  Negative for abdominal pain, constipation, diarrhea, nausea and vomiting.  Neurological:  Negative for headaches.      BP 110/77   Pulse 83   Wt 139 lb (63 kg)   BMI 23.86 kg/m   General Appearance: Fairly Groomed  Eye Contact:  Good  Speech:  Clear and Coherent  Volume:  Normal  Mood: "okay"  Affect:  Congruent  Thought Process:  Coherent  Orientation:  Full (Time, Place, and Person)  Thought Content: Logical   Suicidal Thoughts:  No  Homicidal Thoughts:  No  Memory:  Immediate;   Good  Judgement:  fair  Insight:  fair  Psychomotor Activity:  Normal  Concentration:  Concentration: Good  Recall:  Good  Fund of Knowledge: Good  Language: Good  Akathisia:  No  Handed:    AIMS (if indicated): not done  Assets:  Communication Skills Desire for Improvement Financial Resources/Insurance Housing Leisure Time Physical Health  ADL's:  Intact  Cognition: WNL  Sleep:  Fair     Metabolic Disorder Labs: No results found for: "HGBA1C", "MPG" No results found for: "PROLACTIN" No results found for: "CHOL", "TRIG", "HDL", "CHOLHDL", "VLDL",  "LDLCALC" No results found for: "TSH"  Therapeutic Level Labs: No results found for: "LITHIUM " No results found for: "VALPROATE" No results found for: "CBMZ"  Screenings: GAD-7    Flowsheet Row Office Visit from 11/06/2015 in [facility]  Total GAD-7 Score 16      PHQ2-9    Flowsheet Row Video Visit from 09/19/2022 in [facility] Video Visit from 08/15/2022 in [facility] Video Visit from 06[facility]20/2022 in [facility] Video Visit from 04[facility]11/2022 in [facility] Video Visit from 02/07/2022 in [facility]  PHQ-2 Total Score 2 4 2  0 3  PHQ-9 Total Score 6 7 3  -- 3      Flowsheet Row Video Visit from 09/19/2022 in [facility] from 08/21/2022 in [facility] Emergency Department at [facility] ED from 06/30/2022 in [facility] Emergency Department at [facility]  C-SSRS RISK CATEGORY No Risk No Risk No Risk       Collaboration of Care: none  A total of 30 minutes was spent involved in face to face clinical care, chart review, documentation.   Marilou Showman, MD 08/27/2023, 11:10 AM

## 2023-08-27 NOTE — Addendum Note (Signed)
 Addended by: Donnelly Gainer on: 08/27/2023 11:37 AM   Modules accepted: Level of Service

## 2023-09-06 ENCOUNTER — Other Ambulatory Visit (HOSPITAL_COMMUNITY): Payer: Self-pay | Admitting: Student

## 2023-09-06 DIAGNOSIS — F5105 Insomnia due to other mental disorder: Secondary | ICD-10-CM

## 2023-09-06 DIAGNOSIS — F4310 Post-traumatic stress disorder, unspecified: Secondary | ICD-10-CM

## 2023-09-06 DIAGNOSIS — F330 Major depressive disorder, recurrent, mild: Secondary | ICD-10-CM

## 2023-10-01 ENCOUNTER — Telehealth (HOSPITAL_COMMUNITY): Payer: Self-pay | Admitting: Student

## 2023-10-01 ENCOUNTER — Ambulatory Visit (HOSPITAL_COMMUNITY): Admitting: Student

## 2023-10-01 NOTE — Telephone Encounter (Signed)
 Called the patient 10 minutes after scheduled appointment time.  No answer.  She will need to be scheduled with another resident psychiatrist if she cannot be put on my schedule for late June.

## 2023-10-02 ENCOUNTER — Telehealth (INDEPENDENT_AMBULATORY_CARE_PROVIDER_SITE_OTHER): Admitting: Licensed Clinical Social Worker

## 2023-10-02 ENCOUNTER — Ambulatory Visit (HOSPITAL_COMMUNITY): Admitting: Licensed Clinical Social Worker

## 2023-10-02 DIAGNOSIS — M5416 Radiculopathy, lumbar region: Secondary | ICD-10-CM | POA: Diagnosis not present

## 2023-10-02 DIAGNOSIS — M5412 Radiculopathy, cervical region: Secondary | ICD-10-CM | POA: Diagnosis not present

## 2023-10-02 NOTE — Telephone Encounter (Signed)
 Christine Douglas had an in person clinical assessment scheduled today for 2pm.  Clinician outreach Christine Douglas by phone at 2:10pm when she did not present on time.  Christine Douglas did not answer this phone call, and a voicemail could not be left due to busy signal.  Clinician informed front desk staff of no show event when Christine Douglas did not appear by 2:15pm.  Desmond Florida, LCSW, LCAS 10/02/23

## 2023-10-18 DIAGNOSIS — R0602 Shortness of breath: Secondary | ICD-10-CM | POA: Diagnosis not present

## 2023-10-18 DIAGNOSIS — S53104A Unspecified dislocation of right ulnohumeral joint, initial encounter: Secondary | ICD-10-CM | POA: Diagnosis not present

## 2023-10-18 DIAGNOSIS — S52571A Other intraarticular fracture of lower end of right radius, initial encounter for closed fracture: Secondary | ICD-10-CM | POA: Diagnosis not present

## 2023-10-18 DIAGNOSIS — R609 Edema, unspecified: Secondary | ICD-10-CM | POA: Diagnosis not present

## 2023-10-18 DIAGNOSIS — F32A Depression, unspecified: Secondary | ICD-10-CM | POA: Diagnosis not present

## 2023-10-18 DIAGNOSIS — S53124A Posterior dislocation of right ulnohumeral joint, initial encounter: Secondary | ICD-10-CM | POA: Diagnosis not present

## 2023-10-18 DIAGNOSIS — S52501A Unspecified fracture of the lower end of right radius, initial encounter for closed fracture: Secondary | ICD-10-CM | POA: Diagnosis not present

## 2023-10-18 DIAGNOSIS — S4981XA Other specified injuries of right shoulder and upper arm, initial encounter: Secondary | ICD-10-CM | POA: Diagnosis not present

## 2023-10-18 DIAGNOSIS — S42401A Unspecified fracture of lower end of right humerus, initial encounter for closed fracture: Secondary | ICD-10-CM | POA: Diagnosis not present

## 2023-10-18 DIAGNOSIS — F419 Anxiety disorder, unspecified: Secondary | ICD-10-CM | POA: Diagnosis not present

## 2023-10-18 DIAGNOSIS — S52611A Displaced fracture of right ulna styloid process, initial encounter for closed fracture: Secondary | ICD-10-CM | POA: Diagnosis not present

## 2023-10-18 DIAGNOSIS — W19XXXA Unspecified fall, initial encounter: Secondary | ICD-10-CM | POA: Diagnosis not present

## 2023-10-18 DIAGNOSIS — Z884 Allergy status to anesthetic agent status: Secondary | ICD-10-CM | POA: Diagnosis not present

## 2023-10-18 DIAGNOSIS — S52351A Displaced comminuted fracture of shaft of radius, right arm, initial encounter for closed fracture: Secondary | ICD-10-CM | POA: Diagnosis not present

## 2023-10-18 DIAGNOSIS — Z87891 Personal history of nicotine dependence: Secondary | ICD-10-CM | POA: Diagnosis not present

## 2023-10-18 DIAGNOSIS — S098XXA Other specified injuries of head, initial encounter: Secondary | ICD-10-CM | POA: Diagnosis not present

## 2023-10-18 DIAGNOSIS — M79603 Pain in arm, unspecified: Secondary | ICD-10-CM | POA: Diagnosis not present

## 2023-10-18 DIAGNOSIS — I959 Hypotension, unspecified: Secondary | ICD-10-CM | POA: Diagnosis not present

## 2023-10-18 DIAGNOSIS — W130XXA Fall from, out of or through balcony, initial encounter: Secondary | ICD-10-CM | POA: Diagnosis not present

## 2023-10-19 DIAGNOSIS — S52351A Displaced comminuted fracture of shaft of radius, right arm, initial encounter for closed fracture: Secondary | ICD-10-CM | POA: Diagnosis not present

## 2023-10-19 DIAGNOSIS — S4981XA Other specified injuries of right shoulder and upper arm, initial encounter: Secondary | ICD-10-CM | POA: Diagnosis not present

## 2023-10-19 DIAGNOSIS — R0602 Shortness of breath: Secondary | ICD-10-CM | POA: Diagnosis not present

## 2023-10-19 DIAGNOSIS — S098XXA Other specified injuries of head, initial encounter: Secondary | ICD-10-CM | POA: Diagnosis not present

## 2023-10-19 DIAGNOSIS — S52571A Other intraarticular fracture of lower end of right radius, initial encounter for closed fracture: Secondary | ICD-10-CM | POA: Diagnosis not present

## 2023-10-19 DIAGNOSIS — S52611A Displaced fracture of right ulna styloid process, initial encounter for closed fracture: Secondary | ICD-10-CM | POA: Diagnosis not present

## 2023-10-20 DIAGNOSIS — M25521 Pain in right elbow: Secondary | ICD-10-CM | POA: Diagnosis not present

## 2023-10-20 DIAGNOSIS — S53104A Unspecified dislocation of right ulnohumeral joint, initial encounter: Secondary | ICD-10-CM | POA: Diagnosis not present

## 2023-10-20 DIAGNOSIS — S52501A Unspecified fracture of the lower end of right radius, initial encounter for closed fracture: Secondary | ICD-10-CM | POA: Diagnosis not present

## 2023-10-21 DIAGNOSIS — Z01818 Encounter for other preprocedural examination: Secondary | ICD-10-CM | POA: Diagnosis not present

## 2023-10-22 DIAGNOSIS — Z90710 Acquired absence of both cervix and uterus: Secondary | ICD-10-CM | POA: Diagnosis not present

## 2023-10-22 DIAGNOSIS — S6291XA Unspecified fracture of right wrist and hand, initial encounter for closed fracture: Secondary | ICD-10-CM | POA: Diagnosis not present

## 2023-10-22 DIAGNOSIS — S52611A Displaced fracture of right ulna styloid process, initial encounter for closed fracture: Secondary | ICD-10-CM | POA: Diagnosis not present

## 2023-10-22 DIAGNOSIS — Z792 Long term (current) use of antibiotics: Secondary | ICD-10-CM | POA: Diagnosis not present

## 2023-10-22 DIAGNOSIS — Z79891 Long term (current) use of opiate analgesic: Secondary | ICD-10-CM | POA: Diagnosis not present

## 2023-10-22 DIAGNOSIS — S53124A Posterior dislocation of right ulnohumeral joint, initial encounter: Secondary | ICD-10-CM | POA: Diagnosis not present

## 2023-10-22 DIAGNOSIS — S52571A Other intraarticular fracture of lower end of right radius, initial encounter for closed fracture: Secondary | ICD-10-CM | POA: Diagnosis not present

## 2023-10-30 DIAGNOSIS — S53104D Unspecified dislocation of right ulnohumeral joint, subsequent encounter: Secondary | ICD-10-CM | POA: Diagnosis not present

## 2023-10-30 DIAGNOSIS — S52501D Unspecified fracture of the lower end of right radius, subsequent encounter for closed fracture with routine healing: Secondary | ICD-10-CM | POA: Diagnosis not present

## 2023-11-06 ENCOUNTER — Telehealth (HOSPITAL_COMMUNITY): Payer: Self-pay

## 2023-11-06 DIAGNOSIS — F411 Generalized anxiety disorder: Secondary | ICD-10-CM

## 2023-11-06 DIAGNOSIS — S52501D Unspecified fracture of the lower end of right radius, subsequent encounter for closed fracture with routine healing: Secondary | ICD-10-CM | POA: Diagnosis not present

## 2023-11-06 MED ORDER — DIAZEPAM 5 MG PO TABS: 5.0000 mg | ORAL_TABLET | Freq: Three times a day (TID) | ORAL | 0 refills | Status: DC

## 2023-11-06 NOTE — Progress Notes (Deleted)
 BH MD Outpatient Progress Note  11/06/2023 11:30 AM Christine Douglas  MRN:  [id number]  Assessment:  Christine Douglas presents for follow-up evaluation.  The patient reports doing well with the decreased dose of Valium , however, she does report experiencing occasional nighttime sweating.  There are no other symptoms of benzodiazepine withdrawal.  Plan to continue the current dose of Valium  until the next appointment.  Identifying Information: Christine Douglas is a 63 y.o. y.o. female with a history of major depressive disorder and PTSD who is an established patient with [facility] for management of anxiety and depression.   Plan:  # Major depressive disorder, PTSD, GAD Interventions: -- Continue Prozac  60 mg daily - Continue Valium  5 mg 3 times daily - Can add propranolol or gabapentin  should the patient need this - Patient reports an interest in therapy and has been scheduled to see Darleene on 5/29  #Chronic pain - Recommend establishing with pain management specialist  Patient was given contact information for behavioral health clinic and was instructed to call 911 for emergencies.   Subjective:  Chief Complaint:  No chief complaint on file.   Interval History:  It appears that the patient's interpersonal conflict with her daughter has diminished.  She feels that she has been doing fairly well recently.  She reports some issues in her relationship with her boyfriend.  She reports a good appetite as well as restful sleep.  She reports a predominantly euthymic mood.  She denies experiencing suicidal thoughts.  She reports occasional anhedonia but that appear to be a continual issue.  She enjoys taking care of her dogs.  Benzodiazepine withdrawal symptoms are reviewed.  The patient denies experiencing increased anxiety, denies experiencing nausea or vomiting or headache.  The patient is not tremulous.  She reports nighttime sweating as above.  Blood pressure is within normal  limits.    Visit Diagnosis:  No diagnosis found.    Past Psychiatric History: Denies any history of suicide attempts, with no history of psychiatric admission noted in the medical record  Past Medical History:  Past Medical History:  Diagnosis Date   Allergy    Anxiety    Blood transfusion without reported diagnosis    x2   BV (bacterial vaginosis)    Depression    Headache    IBS (irritable bowel syndrome)    Neuromuscular disorder (HCC)    nerve damage C6  C7   PTSD (post-traumatic stress disorder)    Restless leg syndrome    Seasonal allergies    TO (tubal occlusion)     Past Surgical History:  Procedure Laterality Date   ABDOMINAL HYSTERECTOMY     APPENDECTOMY     AUGMENTATION MAMMAPLASTY Bilateral 2021   BACK SURGERY     spine surgery; 2 screws and plate in neck   BREAST BIOPSY Left 09/24/2022   US  LT BREAST BX W LOC DEV 1ST LESION IMG BX SPEC US  GUIDE 09/24/2022 [facility] MAMMOGRAPHY   CESAREAN SECTION     HERNIA MESH REMOVAL     LAPAROSCOPIC BILATERAL SALPINGO OOPHERECTOMY Bilateral 06/10/2017   Procedure: LAPAROSCOPIC BILATERAL OOPHORECTOMY;  Surgeon: Edsel Norleen GAILS, MD;  Location: [facility];  Service: Gynecology;  Laterality: Bilateral;   POSTERIOR CERVICAL FUSION/FORAMINOTOMY Left 02/05/2019   Procedure: Posterior cervical fusion with lateral mass fixation - Cervical seven-Thoracic one with left foraminotomy/ diskectomy;  Surgeon: Joshua Alm RAMAN, MD;  Location: [facility] OR;  Service: Neurosurgery;  Laterality: Left;   SHOULDER ARTHROSCOPY WITH ROTATOR CUFF REPAIR AND  SUBACROMIAL DECOMPRESSION  06/04/2012   Procedure: SHOULDER ARTHROSCOPY WITH ROTATOR CUFF REPAIR AND SUBACROMIAL DECOMPRESSION;  Surgeon: Toribio JULIANNA Chancy, MD;  Location: [facility];  Service: Orthopedics;  Laterality: Left;  LEFT ARTHROSCOPY SHOULDER DECOMPRESSION SUBACROMIAL PARTIAL ACROMIOPLASTY WITH CORACOACROMIAL RELEASE, DISTAL CLAVICULECTOMY,  ROTATOR CUFF REPAIR    TUBAL LIGATION       Family Psychiatric History: None pertinent  Family History:  Family History  Problem Relation Age of Onset   Hyperlipidemia Mother    Thyroid  disease Mother    Heart attack Father    Dementia Maternal Aunt    Other Brother        suicide   Bipolar disorder Son    Cancer Other    Colon cancer Neg Hx    Colon polyps Neg Hx    Esophageal cancer Neg Hx    Rectal cancer Neg Hx    Stomach cancer Neg Hx    BRCA 1/2 Neg Hx    Breast cancer Neg Hx     Social History:  Social History   Socioeconomic History   Marital status: Divorced    Spouse name: Not on file   Number of children: Not on file   Years of education: 14   Highest education level: Not on file  Occupational History   Not on file  Tobacco Use   Smoking status: Never   Smokeless tobacco: Never  Vaping Use   Vaping status: Never Used  Substance and Sexual Activity   Alcohol use: Yes    Comment: occ   Drug use: No   Sexual activity: Not Currently    Birth control/protection: Surgical    Comment: hyst  Other Topics Concern   Not on file  Social History Narrative   Drinks 1 cup of coffee a day    Social Drivers of Corporate investment banker Strain: Not on file  Food Insecurity: Not on file  Transportation Needs: Not on file  Physical Activity: Not on file  Stress: Not on file  Social Connections: Not on file    Allergies:  Allergies  Allergen Reactions   Anesthetics, Amide Anaphylaxis    Neck surgery pt unsure of exact anesthetic-afraid to have any further surgeries; Feels weird for a while   Other Swelling    muscle relaxers caused legs to swell   Flexeril [Cyclobenzaprine] Swelling    Cannot take muscle relaxers -- swelling of legs and body    Methocarbamol     Singulair [Montelukast] Other (See Comments)    Made me feel crazy    Current Medications: Current Outpatient Medications  Medication Sig Dispense Refill   diazepam  (VALIUM ) 5 MG tablet Take 1 tablet (5 mg total) by mouth 3  (three) times daily. 90 tablet 0   FLUoxetine  (PROZAC ) 20 MG capsule Take 3 capsules (60 mg total) by mouth daily. 180 capsule 1   No current facility-administered medications for this visit.     Objective:  Psychiatric Specialty Exam: Physical Exam Constitutional:      Appearance: the patient is not toxic-appearing.  Pulmonary:     Effort: Pulmonary effort is normal.  Neurological:     General: No focal deficit present.     Mental Status: the patient is alert and oriented to person, place, and time.   Review of Systems  Respiratory:  Negative for shortness of breath.   Cardiovascular:  Negative for chest pain.  Gastrointestinal:  Negative for abdominal pain, constipation, diarrhea, nausea and vomiting.  Neurological:  Negative for headaches.      There were no vitals taken for this visit.  General Appearance: Fairly Groomed  Eye Contact:  Good  Speech:  Clear and Coherent  Volume:  Normal  Mood: okay  Affect:  Congruent  Thought Process:  Coherent  Orientation:  Full (Time, Place, and Person)  Thought Content: Logical   Suicidal Thoughts:  No  Homicidal Thoughts:  No  Memory:  Immediate;   Good  Judgement:  fair  Insight:  fair  Psychomotor Activity:  Normal  Concentration:  Concentration: Good  Recall:  Good  Fund of Knowledge: Good  Language: Good  Akathisia:  No  Handed:    AIMS (if indicated): not done  Assets:  Communication Skills Desire for Improvement Financial Resources/Insurance Housing Leisure Time Physical Health  ADL's:  Intact  Cognition: WNL  Sleep:  Fair     Metabolic Disorder Labs: No results found for: HGBA1C, MPG No results found for: PROLACTIN No results found for: CHOL, TRIG, HDL, CHOLHDL, VLDL, LDLCALC No results found for: TSH  Therapeutic Level Labs: No results found for: LITHIUM  No results found for: VALPROATE No results found for: CBMZ  Screenings: GAD-7    Flowsheet Row [facility]  11[facility]06/[facility]  Total GAD-7 Score 16   PHQ2-9    Flowsheet Row Video Visit from 09/19/2022 in [facility] Video Visit from 08/15/2022 in [facility] Video Visit from 06/20/2022 in [facility] Video Visit from 04[facility]11/2022 in [facility] Video Visit from 02/07/2022 in [facility]  PHQ-2 Total Score 2 4 2  0 3  PHQ-9 Total Score 6 7 3  -- 3   Flowsheet Row Video Visit from 09/19/2022 in [facility] ED from 08/21/2022 in [facility] Emergency Department at [facility] ED from 06/30/2022 in [facility] Emergency Department at [facility]  C-SSRS RISK CATEGORY No Risk No Risk No Risk    Collaboration of Care: none  A total of 30 minutes was spent involved in face to face clinical care, chart review, documentation.   Willean Schurman, MD 11/06/2023, 11:30 AM

## 2023-11-06 NOTE — Telephone Encounter (Signed)
 PDMP reviewed and scrip sent into the pharmacy.

## 2023-11-06 NOTE — Telephone Encounter (Signed)
 Patient has a follow up on Monday but is out of her Valium , she had a refill on the Prozac . Patient was seeing Karleen. She would like it sent to the [facility] on [address]. Please review and advise, thank you

## 2023-11-10 ENCOUNTER — Ambulatory Visit (HOSPITAL_COMMUNITY)

## 2023-11-11 NOTE — Progress Notes (Unsigned)
 BH MD Outpatient Progress Note  11/11/2023 11:10 AM Christine Douglas  MRN:  [id number]  Assessment:  Christine Douglas presents for follow-up evaluation.  The patient reports doing well with the decreased dose of Valium , however, she does report experiencing occasional nighttime sweating.  There are no other symptoms of benzodiazepine withdrawal.  Plan to continue the current dose of Valium  until the next appointment.  Identifying Information: Christine Douglas is a 63 y.o. y.o. female with a history of major depressive disorder and PTSD who is an established patient with [facility] for management of anxiety and depression.   Plan:  # Major depressive disorder, PTSD, GAD Interventions: -- Continue Prozac  60 mg daily - Continue Valium  5 mg 3 times daily - Can add propranolol or gabapentin  should the patient need this - Patient reports an interest in therapy and has been scheduled to see Darleene on 5/29  #Chronic pain - Recommend establishing with pain management specialist  Patient was given contact information for behavioral health clinic and was instructed to call 911 for emergencies.   Subjective:  Chief Complaint:  No chief complaint on file.   Interval History:  It appears that the patient's interpersonal conflict with her daughter has diminished.  She feels that she has been doing fairly well recently.  She reports some issues in her relationship with her boyfriend.  She reports a good appetite as well as restful sleep.  She reports a predominantly euthymic mood.  She denies experiencing suicidal thoughts.  She reports occasional anhedonia but that appear to be a continual issue.  She enjoys taking care of her dogs.  Benzodiazepine withdrawal symptoms are reviewed.  The patient denies experiencing increased anxiety, denies experiencing nausea or vomiting or headache.  The patient is not tremulous.  She reports nighttime sweating as above.  Blood pressure is within normal  limits.    Visit Diagnosis:  No diagnosis found.    Past Psychiatric History: Denies any history of suicide attempts, with no history of psychiatric admission noted in the medical record  Past Medical History:  Past Medical History:  Diagnosis Date   Allergy    Anxiety    Blood transfusion without reported diagnosis    x2   BV (bacterial vaginosis)    Depression    Headache    IBS (irritable bowel syndrome)    Neuromuscular disorder (HCC)    nerve damage C6  C7   PTSD (post-traumatic stress disorder)    Restless leg syndrome    Seasonal allergies    TO (tubal occlusion)     Past Surgical History:  Procedure Laterality Date   ABDOMINAL HYSTERECTOMY     APPENDECTOMY     AUGMENTATION MAMMAPLASTY Bilateral 2021   BACK SURGERY     spine surgery; 2 screws and plate in neck   BREAST BIOPSY Left 09/24/2022   US  LT BREAST BX W LOC DEV 1ST LESION IMG BX SPEC US  GUIDE 09/24/2022 [facility] MAMMOGRAPHY   CESAREAN SECTION     HERNIA MESH REMOVAL     LAPAROSCOPIC BILATERAL SALPINGO OOPHERECTOMY Bilateral 06/10/2017   Procedure: LAPAROSCOPIC BILATERAL OOPHORECTOMY;  Surgeon: Edsel Norleen GAILS, MD;  Location: [facility];  Service: Gynecology;  Laterality: Bilateral;   POSTERIOR CERVICAL FUSION/FORAMINOTOMY Left 02/05/2019   Procedure: Posterior cervical fusion with lateral mass fixation - Cervical seven-Thoracic one with left foraminotomy/ diskectomy;  Surgeon: Joshua Alm RAMAN, MD;  Location: [facility] OR;  Service: Neurosurgery;  Laterality: Left;   SHOULDER ARTHROSCOPY WITH ROTATOR CUFF REPAIR AND  SUBACROMIAL DECOMPRESSION  06/04/2012   Procedure: SHOULDER ARTHROSCOPY WITH ROTATOR CUFF REPAIR AND SUBACROMIAL DECOMPRESSION;  Surgeon: Toribio JULIANNA Chancy, MD;  Location: [facility];  Service: Orthopedics;  Laterality: Left;  LEFT ARTHROSCOPY SHOULDER DECOMPRESSION SUBACROMIAL PARTIAL ACROMIOPLASTY WITH CORACOACROMIAL RELEASE, DISTAL CLAVICULECTOMY,  ROTATOR CUFF REPAIR    TUBAL LIGATION       Family Psychiatric History: None pertinent  Family History:  Family History  Problem Relation Age of Onset   Hyperlipidemia Mother    Thyroid  disease Mother    Heart attack Father    Dementia Maternal Aunt    Other Brother        suicide   Bipolar disorder Son    Cancer Other    Colon cancer Neg Hx    Colon polyps Neg Hx    Esophageal cancer Neg Hx    Rectal cancer Neg Hx    Stomach cancer Neg Hx    BRCA 1/2 Neg Hx    Breast cancer Neg Hx     Social History:  Social History   Socioeconomic History   Marital status: Divorced    Spouse name: Not on file   Number of children: Not on file   Years of education: 14   Highest education level: Not on file  Occupational History   Not on file  Tobacco Use   Smoking status: Never   Smokeless tobacco: Never  Vaping Use   Vaping status: Never Used  Substance and Sexual Activity   Alcohol use: Yes    Comment: occ   Drug use: No   Sexual activity: Not Currently    Birth control/protection: Surgical    Comment: hyst  Other Topics Concern   Not on file  Social History Narrative   Drinks 1 cup of coffee a day    Social Drivers of Corporate investment banker Strain: Not on file  Food Insecurity: Not on file  Transportation Needs: Not on file  Physical Activity: Not on file  Stress: Not on file  Social Connections: Not on file    Allergies:  Allergies  Allergen Reactions   Anesthetics, Amide Anaphylaxis    Neck surgery pt unsure of exact anesthetic-afraid to have any further surgeries; Feels weird for a while   Other Swelling    muscle relaxers caused legs to swell   Flexeril [Cyclobenzaprine] Swelling    Cannot take muscle relaxers -- swelling of legs and body    Methocarbamol     Singulair [Montelukast] Other (See Comments)    Made me feel crazy    Current Medications: Current Outpatient Medications  Medication Sig Dispense Refill   diazepam  (VALIUM ) 5 MG tablet Take 1 tablet (5 mg total) by mouth 3  (three) times daily. 90 tablet 0   FLUoxetine  (PROZAC ) 20 MG capsule Take 3 capsules (60 mg total) by mouth daily. 180 capsule 1   No current facility-administered medications for this visit.     Objective:  Psychiatric Specialty Exam: Physical Exam Constitutional:      Appearance: the patient is not toxic-appearing.  Pulmonary:     Effort: Pulmonary effort is normal.  Neurological:     General: No focal deficit present.     Mental Status: the patient is alert and oriented to person, place, and time.   Review of Systems  Respiratory:  Negative for shortness of breath.   Cardiovascular:  Negative for chest pain.  Gastrointestinal:  Negative for abdominal pain, constipation, diarrhea, nausea and vomiting.  Neurological:  Negative for headaches.      There were no vitals taken for this visit.  General Appearance: Fairly Groomed  Eye Contact:  Good  Speech:  Clear and Coherent  Volume:  Normal  Mood: okay  Affect:  Congruent  Thought Process:  Coherent  Orientation:  Full (Time, Place, and Person)  Thought Content: Logical   Suicidal Thoughts:  No  Homicidal Thoughts:  No  Memory:  Immediate;   Good  Judgement:  fair  Insight:  fair  Psychomotor Activity:  Normal  Concentration:  Concentration: Good  Recall:  Good  Fund of Knowledge: Good  Language: Good  Akathisia:  No  Handed:    AIMS (if indicated): not done  Assets:  Communication Skills Desire for Improvement Financial Resources/Insurance Housing Leisure Time Physical Health  ADL's:  Intact  Cognition: WNL  Sleep:  Fair     Metabolic Disorder Labs: No results found for: HGBA1C, MPG No results found for: PROLACTIN No results found for: CHOL, TRIG, HDL, CHOLHDL, VLDL, LDLCALC No results found for: TSH  Therapeutic Level Labs: No results found for: LITHIUM  No results found for: VALPROATE No results found for: CBMZ  Screenings: GAD-7    Flowsheet Row Office Visit from  11/06/2015 in [facility]  Total GAD-7 Score 16   PHQ2-9    Flowsheet Row Video Visit from 09/19/2022 in [facility] Video Visit from 08/15/2022 in [facility] Video Visit from 06/20/2022 in [facility] Video Visit from 04[facility]11/2022 in [facility] Video Visit from 02/07/2022 in [facility]  PHQ-2 Total Score 2 4 2  0 3  PHQ-9 Total Score 6 7 3  -- 3   Flowsheet Row Video Visit from 09/19/2022 in [facility] from 08/21/2022 in [facility] Emergency Department at [facility] ED from 06/30/2022 in [facility] Emergency Department at [facility]  C-SSRS RISK CATEGORY No Risk No Risk No Risk    Collaboration of Care: none  A total of 30 minutes was spent involved in face to face clinical care, chart review, documentation.   Marios Gaiser, MD 11/11/2023, 11:10 AM

## 2023-11-17 ENCOUNTER — Encounter (HOSPITAL_COMMUNITY): Payer: Self-pay

## 2023-11-17 ENCOUNTER — Ambulatory Visit (HOSPITAL_BASED_OUTPATIENT_CLINIC_OR_DEPARTMENT_OTHER)

## 2023-11-17 DIAGNOSIS — F99 Mental disorder, not otherwise specified: Secondary | ICD-10-CM | POA: Diagnosis not present

## 2023-11-17 DIAGNOSIS — F4310 Post-traumatic stress disorder, unspecified: Secondary | ICD-10-CM | POA: Diagnosis not present

## 2023-11-17 DIAGNOSIS — F411 Generalized anxiety disorder: Secondary | ICD-10-CM

## 2023-11-17 DIAGNOSIS — F330 Major depressive disorder, recurrent, mild: Secondary | ICD-10-CM

## 2023-11-17 DIAGNOSIS — F5105 Insomnia due to other mental disorder: Secondary | ICD-10-CM | POA: Diagnosis not present

## 2023-11-17 MED ORDER — FLUOXETINE HCL 20 MG PO CAPS: 60.0000 mg | ORAL_CAPSULE | Freq: Every day | ORAL | 0 refills | Status: DC

## 2023-11-17 MED ORDER — VALIUM 5 MG PO TABS
5.0000 mg | ORAL_TABLET | Freq: Three times a day (TID) | ORAL | 2 refills | Status: DC
Start: 2023-11-17 — End: 2023-11-17

## 2023-11-17 MED ORDER — VALIUM 5 MG PO TABS: 5.0000 mg | ORAL_TABLET | Freq: Three times a day (TID) | ORAL | 2 refills | Status: DC

## 2023-11-18 NOTE — Addendum Note (Signed)
 Addended by: CARVIN CROCK on: 11/18/2023 09:17 AM   Modules accepted: Level of Service

## 2023-12-01 ENCOUNTER — Other Ambulatory Visit: Payer: Self-pay | Admitting: Internal Medicine

## 2023-12-01 DIAGNOSIS — Z1231 Encounter for screening mammogram for malignant neoplasm of breast: Secondary | ICD-10-CM

## 2023-12-04 DIAGNOSIS — S53104D Unspecified dislocation of right ulnohumeral joint, subsequent encounter: Secondary | ICD-10-CM | POA: Diagnosis not present

## 2023-12-04 DIAGNOSIS — S52501D Unspecified fracture of the lower end of right radius, subsequent encounter for closed fracture with routine healing: Secondary | ICD-10-CM | POA: Diagnosis not present

## 2023-12-08 ENCOUNTER — Other Ambulatory Visit (HOSPITAL_COMMUNITY): Payer: Self-pay | Admitting: Psychiatry

## 2023-12-08 DIAGNOSIS — F411 Generalized anxiety disorder: Secondary | ICD-10-CM

## 2023-12-12 ENCOUNTER — Ambulatory Visit

## 2023-12-18 ENCOUNTER — Ambulatory Visit

## 2023-12-23 DIAGNOSIS — K581 Irritable bowel syndrome with constipation: Secondary | ICD-10-CM | POA: Diagnosis not present

## 2023-12-23 DIAGNOSIS — Z6823 Body mass index (BMI) 23.0-23.9, adult: Secondary | ICD-10-CM | POA: Diagnosis not present

## 2023-12-23 DIAGNOSIS — J3081 Allergic rhinitis due to animal (cat) (dog) hair and dander: Secondary | ICD-10-CM | POA: Diagnosis not present

## 2023-12-23 DIAGNOSIS — Z0000 Encounter for general adult medical examination without abnormal findings: Secondary | ICD-10-CM | POA: Diagnosis not present

## 2023-12-23 DIAGNOSIS — N182 Chronic kidney disease, stage 2 (mild): Secondary | ICD-10-CM | POA: Diagnosis not present

## 2023-12-23 DIAGNOSIS — K219 Gastro-esophageal reflux disease without esophagitis: Secondary | ICD-10-CM | POA: Diagnosis not present

## 2023-12-23 DIAGNOSIS — M5431 Sciatica, right side: Secondary | ICD-10-CM | POA: Diagnosis not present

## 2023-12-23 DIAGNOSIS — G43919 Migraine, unspecified, intractable, without status migrainosus: Secondary | ICD-10-CM | POA: Diagnosis not present

## 2023-12-23 DIAGNOSIS — F3341 Major depressive disorder, recurrent, in partial remission: Secondary | ICD-10-CM | POA: Diagnosis not present

## 2023-12-30 ENCOUNTER — Ambulatory Visit
Admission: RE | Admit: 2023-12-30 | Discharge: 2023-12-30 | Disposition: A | Discharge status: Home / Self Care | Source: Ambulatory Visit | Attending: Internal Medicine | Admitting: Internal Medicine

## 2023-12-30 DIAGNOSIS — Z1231 Encounter for screening mammogram for malignant neoplasm of breast: Secondary | ICD-10-CM
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
## 2024-02-16 ENCOUNTER — Ambulatory Visit (HOSPITAL_COMMUNITY)

## 2024-02-16 VITALS — BP 116/85 | HR 90 | Ht 65.0 in | Wt 134.6 lb

## 2024-02-16 DIAGNOSIS — F411 Generalized anxiety disorder: Secondary | ICD-10-CM | POA: Diagnosis not present

## 2024-02-16 DIAGNOSIS — F5105 Insomnia due to other mental disorder: Secondary | ICD-10-CM | POA: Diagnosis not present

## 2024-02-16 DIAGNOSIS — F99 Mental disorder, not otherwise specified: Secondary | ICD-10-CM

## 2024-02-16 DIAGNOSIS — F330 Major depressive disorder, recurrent, mild: Secondary | ICD-10-CM | POA: Diagnosis not present

## 2024-02-16 DIAGNOSIS — F4310 Post-traumatic stress disorder, unspecified: Secondary | ICD-10-CM | POA: Diagnosis not present

## 2024-02-16 MED ORDER — BUSPIRONE HCL 5 MG PO TABS
5.0000 mg | ORAL_TABLET | Freq: Two times a day (BID) | ORAL | 0 refills | Status: DC
Start: 2024-02-16 — End: 2024-03-10

## 2024-02-16 MED ORDER — VALIUM 5 MG PO TABS: 5.0000 mg | ORAL_TABLET | ORAL | 0 refills | Status: DC

## 2024-02-16 NOTE — Addendum Note (Signed)
 Addended by: CARVIN CROCK on: 02/16/2024 03:58 PM   Modules accepted: Level of Service
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
## 2024-03-05 ENCOUNTER — Other Ambulatory Visit (HOSPITAL_COMMUNITY): Payer: Self-pay

## 2024-03-05 DIAGNOSIS — F330 Major depressive disorder, recurrent, mild: Secondary | ICD-10-CM

## 2024-03-05 DIAGNOSIS — F5105 Insomnia due to other mental disorder: Secondary | ICD-10-CM

## 2024-03-05 DIAGNOSIS — F4310 Post-traumatic stress disorder, unspecified: Secondary | ICD-10-CM

## 2024-03-10 ENCOUNTER — Other Ambulatory Visit (HOSPITAL_COMMUNITY): Payer: Self-pay

## 2024-03-10 DIAGNOSIS — F4310 Post-traumatic stress disorder, unspecified: Secondary | ICD-10-CM

## 2024-03-10 DIAGNOSIS — F411 Generalized anxiety disorder: Secondary | ICD-10-CM

## 2024-03-15 ENCOUNTER — Ambulatory Visit (HOSPITAL_COMMUNITY)

## 2024-03-15 ENCOUNTER — Other Ambulatory Visit (HOSPITAL_COMMUNITY): Payer: Self-pay

## 2024-03-15 ENCOUNTER — Telehealth (HOSPITAL_COMMUNITY): Payer: Self-pay

## 2024-03-15 DIAGNOSIS — F411 Generalized anxiety disorder: Secondary | ICD-10-CM

## 2024-03-15 MED ORDER — VALIUM 5 MG PO TABS: 2.5000 mg | ORAL_TABLET | ORAL | 0 refills | Status: DC

## 2024-03-15 NOTE — Progress Notes (Signed)
 Patient had an appointment today.  She is sick, had to reschedule her appointment in 2 weeks.  Planning to continue her Valium  taper to 2.5 mg 3 times daily for the 2 weeks until her appointment.  Prescription sent to the pharmacy, patient updated via phone.

## 2024-03-15 NOTE — Telephone Encounter (Signed)
 Patent called and had to reschedule her appointment, you already sent the Buspar last week, patient said she has been doing the taper off of Valium , she wants to know if that is done or should she still be doing 1/2 tab am, 1/2 tap noon and 1 tab at bedtime? She is rescheduled for the 24th, she is just not feeling well today

## 2024-03-15 NOTE — Progress Notes (Addendum)
 [facility] MD Outpatient Progress Note  03/29/2024 3:36 PM Christine Douglas  MRN:  [id number]  Assessment:  Christine Douglas presents for follow-up evaluation.  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  She is not actively or passively homicidal or suicidal, has involved behavioral modifications including swimming, breathing exercises, meditation to help with her anxiety.  She did not take buspirone , self tapered it down due to increased emotional lability while being on it.  Her anxiety and panic attacks have improved and she is not feeling depressed which has remained in sustained remission.  She has involved healthy eating into her daily schedule.  She is also not using any substances including alcohol or cigarettes.  Ringing sensation in the ears and increased sweating resolved when increasing the nightly dose of Valium  to 5 mg.  Since she is highly motivated to taper it down and due to being sensitive on the tapered regimen, plan to continue Valium  at 2.5 mg in the morning, 2.5 mg in the afternoon and 5 mg nightly.  She was encouraged to self taper down the morning dose since she has been forgetting to take that on some days.  No other medications were prescribed today, will have her back in the clinic in 12 weeks.  Identifying Information: Christine Douglas is a 63 y.o. y.o. female with a history of major depressive disorder and PTSD who is an established patient with [facility] for management of anxiety and depression.   Plan:  # Major depressive disorder, PTSD, GAD Interventions: Prozac , buspirone  - Continue Valium  2.5 mg in the AM, 2.5 Mg in the afternoon and 5 mg nightly. Continue self tapering the morning dose.  - Patient does not want to take gabapentin , has low blood pressure  will likely avoid propranolol, she discontinued buspirone  due to side effects  #Chronic pain - Continue to monitor.  Encouraged her to establish with a pain specialist  Patient was given contact information for behavioral health clinic and was instructed to call 911 for emergencies.   Subjective:  Chief Complaint:  Chief Complaint  Patient presents with   Follow-up   Medication Refill   Anxiety    Interval History:  Today, patient reported her mood as scary .  Reported that her anxiety is not as high, stated that she stopped taking buspirone , self tapered it down to 1 pill daily for 4 days before discontinuing it as it was making me cry .  She denied any active or passive SI/HI/AVH.  She reported interpersonal conflicts with her daughter, stressors of having to take care of her mother.  She reported no changes with her appetite and improvement in her sleep, sleeps from 9 PM till 8 AM, wakes up in the middle of the night but is able to go back to sleep.  Reported that she has not been on any other medications apart from diazepam .  She previously tried to taper it off to 2.5 mg 3 times daily however experienced ringing sensation in her ears associated with increased sweating that resolved on increasing the nightly dose to 5 mg.  She denied any other side effects or any new physical concerns.  Reported that she has been trying to eat healthy, only drinks 1 glass of wine each month, denied using any other substances, reported limiting the  use of caffeine and being off soda drinks.  She reported that on some days she forgets to take the morning dose of Valium , encouraged her to try self tapering herself on the morning dose and continue the nightly dose at 5 mg in addition to the afternoon dose of 2.5 mg.  We will have her back in the clinic in 12 weeks.   Visit Diagnosis:    ICD-10-CM   1. GAD (generalized anxiety disorder)  F41.1 VALIUM  5 MG tablet    2. PTSD (post-traumatic stress  disorder)  F43.10        Past Psychiatric History: Denies any history of suicide attempts, with no history of psychiatric admission noted in the medical record  Past Medical History:  Past Medical History:  Diagnosis Date   Allergy    Anxiety    Blood transfusion without reported diagnosis    x2   BV (bacterial vaginosis)    Depression    Headache    IBS (irritable bowel syndrome)    Neuromuscular disorder (HCC)    nerve damage C6  C7   PTSD (post-traumatic stress disorder)    Restless leg syndrome    Seasonal allergies    TO (tubal occlusion)     Past Surgical History:  Procedure Laterality Date   ABDOMINAL HYSTERECTOMY     APPENDECTOMY     AUGMENTATION MAMMAPLASTY Bilateral 2021   BACK SURGERY     spine surgery; 2 screws and plate in neck   BREAST BIOPSY Left 09/24/2022   US  LT BREAST BX W LOC DEV 1ST LESION IMG BX SPEC US  GUIDE 09/24/2022 [facility] MAMMOGRAPHY   CESAREAN SECTION     HERNIA MESH REMOVAL     LAPAROSCOPIC BILATERAL SALPINGO OOPHERECTOMY Bilateral 06/10/2017   Procedure: LAPAROSCOPIC BILATERAL OOPHORECTOMY;  Surgeon: Edsel Norleen GAILS, MD;  Location: [facility];  Service: Gynecology;  Laterality: Bilateral;   POSTERIOR CERVICAL FUSION/FORAMINOTOMY Left 02/05/2019   Procedure: Posterior cervical fusion with lateral mass fixation - Cervical seven-Thoracic one with left foraminotomy/ diskectomy;  Surgeon: Joshua Alm RAMAN, MD;  Location: [facility] OR;  Service: Neurosurgery;  Laterality: Left;   SHOULDER ARTHROSCOPY WITH ROTATOR CUFF REPAIR AND SUBACROMIAL DECOMPRESSION  06/04/2012   Procedure: SHOULDER ARTHROSCOPY WITH ROTATOR CUFF REPAIR AND SUBACROMIAL DECOMPRESSION;  Surgeon: Toribio JULIANNA Chancy, MD;  Location: [facility];  Service: Orthopedics;  Laterality: Left;  LEFT ARTHROSCOPY SHOULDER DECOMPRESSION SUBACROMIAL PARTIAL ACROMIOPLASTY WITH CORACOACROMIAL RELEASE, DISTAL CLAVICULECTOMY,  ROTATOR CUFF REPAIR    TUBAL LIGATION      Family Psychiatric History:  None pertinent  Family History:  Family History  Problem Relation Age of Onset   Hyperlipidemia Mother    Thyroid  disease Mother    Heart attack Father    Dementia Maternal Aunt    Other Brother        suicide   Bipolar disorder Son    Cancer Other    Colon cancer Neg Hx    Colon polyps Neg Hx    Esophageal cancer Neg Hx    Rectal cancer Neg Hx    Stomach cancer Neg Hx    BRCA 1/2 Neg Hx    Breast cancer Neg Hx     Social History:  Social History   Socioeconomic History   Marital status: Divorced    Spouse name: Not on file   Number of children: Not on file   Years of education: 14   Highest education level: Not on file  Occupational History   Not on  file  Tobacco Use   Smoking status: Never   Smokeless tobacco: Never  Vaping Use   Vaping status: Never Used  Substance and Sexual Activity   Alcohol use: Yes    Comment: occ   Drug use: No   Sexual activity: Not Currently    Birth control/protection: Surgical    Comment: hyst  Other Topics Concern   Not on file  Social History Narrative   Drinks 1 cup of coffee a day    Social Drivers of Corporate Investment Banker Strain: Not on file  Food Insecurity: Not on file  Transportation Needs: Not on file  Physical Activity: Not on file  Stress: Not on file  Social Connections: Not on file    Allergies:  Allergies  Allergen Reactions   Anesthetics, Amide Anaphylaxis    Neck surgery pt unsure of exact anesthetic-afraid to have any further surgeries; Feels weird for a while   Other Swelling    muscle relaxers caused legs to swell   Flexeril [Cyclobenzaprine] Swelling    Cannot take muscle relaxers -- swelling of legs and body    Methocarbamol     Singulair [Montelukast] Other (See Comments)    Made me feel crazy    Current Medications: Current Outpatient Medications  Medication Sig Dispense Refill   VALIUM  5 MG tablet Take 0.5 tablets (2.5 mg total) by mouth as directed. Take 1/2  tablet in the  morning, 1/2 tablet in the afternoon and 1 tablet at night 60 tablet 2   No current facility-administered medications for this visit.     Objective:  Psychiatric Specialty Exam: Physical Exam Constitutional:      Appearance: the patient is not toxic-appearing.  Pulmonary:     Effort: Pulmonary effort is normal.  Neurological:     General: No focal deficit present.     Mental Status: the patient is alert and oriented to person, place, and time.   Review of Systems  Respiratory:  Negative for shortness of breath.   Cardiovascular:  Negative for chest pain.  Gastrointestinal:  Negative for abdominal pain, constipation, diarrhea, nausea and vomiting.  Neurological:  Negative for headaches.      BP 107/76   Pulse 85   Ht 5' 5 (1.651 m)   Wt 135 lb (61.2 kg)   BMI 22.47 kg/m   General Appearance: Fairly Groomed  Eye Contact:  Good  Speech:  Clear and Coherent  Volume:  Normal  Mood: okay  Affect:  Congruent  Thought Process:  Coherent  Orientation:  Full (Time, Place, and Person)  Thought Content: Logical   Suicidal Thoughts:  No  Homicidal Thoughts:  No  Memory:  Immediate;   Good  Judgement:  fair  Insight:  fair  Psychomotor Activity:  Normal  Concentration:  Concentration: Good  Recall:  Good  Fund of Knowledge: Good  Language: Good  Akathisia:  No  Handed:    AIMS (if indicated): not done  Assets:  Communication Skills Desire for Improvement Financial Resources/Insurance Housing Leisure Time Physical Health  ADL's:  Intact  Cognition: WNL  Sleep:  Fair     Metabolic Disorder Labs: No results found for: HGBA1C, MPG No results found for: PROLACTIN No results found for: CHOL, TRIG, HDL, CHOLHDL, VLDL, LDLCALC No results found for: TSH  Therapeutic Level Labs: No results found for: LITHIUM  No results found for: VALPROATE No results found for: CBMZ  Screenings: GAD-7    Flowsheet Row [facility]  Total GAD-7 Score 16   PHQ2-9    Flowsheet Row Office Visit from 02/16/2024 in [facility] Office Visit from 11/17/2023 in [facility] Video Visit from 09/19/2022 in [facility] Video Visit from 08[facility]15/2022 in [facility] Video Visit from 06/20/2022 in [facility]  PHQ-2 Total Score 0 0 2 4 2   PHQ-9 Total Score -- -- 6 7 3    Flowsheet Row Video Visit from 09/19/2022 in [facility] ED from 08/21/2022 in [facility] at [facility] ED from 06/30/2022 in [facility] at [facility]  C-SSRS RISK CATEGORY No Risk No Risk No Risk    Collaboration of Care: none  A total of 30 minutes was spent involved in face to face clinical care, chart review, documentation.   Yenni Carra, MD 03/29/2024, 3:36 PM

## 2024-03-23 ENCOUNTER — Telehealth (HOSPITAL_COMMUNITY): Payer: Self-pay

## 2024-03-23 NOTE — Telephone Encounter (Signed)
 Patient is calling - she is having difficulty with weaning off of the Valium . She said she is having pain in her arms, dizziness, fatigue, etc. She had a panic attack a few days ago. Patient has a follow up on 11/24 and would like a call back, or to move her appointment up. She would like to go back to the Valium .

## 2024-03-29 ENCOUNTER — Ambulatory Visit (HOSPITAL_COMMUNITY)

## 2024-03-29 DIAGNOSIS — F4310 Post-traumatic stress disorder, unspecified: Secondary | ICD-10-CM

## 2024-03-29 DIAGNOSIS — F411 Generalized anxiety disorder: Secondary | ICD-10-CM

## 2024-03-29 MED ORDER — VALIUM 5 MG PO TABS
2.5000 mg | ORAL_TABLET | ORAL | 2 refills | Status: AC
Start: 2024-03-29 — End: ?

## 2024-04-14 ENCOUNTER — Institutional Professional Consult (permissible substitution): Payer: Self-pay

## 2024-05-19 ENCOUNTER — Ambulatory Visit (INDEPENDENT_AMBULATORY_CARE_PROVIDER_SITE_OTHER): Payer: Self-pay

## 2024-05-19 VITALS — BP 118/79 | HR 99 | Ht 64.0 in | Wt 153.2 lb

## 2024-05-19 DIAGNOSIS — T8543XA Leakage of breast prosthesis and implant, initial encounter: Secondary | ICD-10-CM

## 2024-05-19 DIAGNOSIS — T8544XA Capsular contracture of breast implant, initial encounter: Secondary | ICD-10-CM

## 2024-05-19 DIAGNOSIS — Z9882 Breast implant status: Secondary | ICD-10-CM

## 2024-05-19 NOTE — Progress Notes (Addendum)
 CLINIC CONSULT    NAME: Christine Douglas MRN: [id number] DOB: [birth year]  PCP: Orpha Yancey LABOR, MD  CHIEF COMPLAINT: Possible Breast Implant Rupture and Bilateral Capsular Contracture.   HPI:  Christine Douglas is a 64 y.o. year old female who presents with capsular contracture that includes pain and hardness more severe on the left than the right. She is concerned about rupture on the right. She has not had an MRI, she has hardware on the neck and wrist that is MRI compatible according to radiology.  She thinks that the implants are silicone, she does not know the size of the implants, and the surgeon that performed the operation has passed away so no operative report is available.  She would like the implants replaced and the capsules potentially removed. She would like a mastopexy  if needed, as well as liposuction of the lateral chest bilaterally.  She states that she experiences spontaneous pain more severe on the left than the right breast, it is hard for her to find relief and it affects her daily activities.  She also expresses neck pain, thinks that is related to the size of her breasts.  Would like to be smaller.  She also complains of rashes below her breasts.  PMH: Past Medical History:  Diagnosis Date   Allergy    Anxiety    Blood transfusion without reported diagnosis    x2   BV (bacterial vaginosis)    Depression    Headache    IBS (irritable bowel syndrome)    Neuromuscular disorder (HCC)    nerve damage C6  C7   PTSD (post-traumatic stress disorder)    Restless leg syndrome    Seasonal allergies    TO (tubal occlusion)     PSH: Past Surgical History:  Procedure Laterality Date   ABDOMINAL HYSTERECTOMY     APPENDECTOMY     AUGMENTATION MAMMAPLASTY Bilateral 2021   BACK SURGERY     spine surgery; 2 screws and plate in neck   BREAST BIOPSY Left 09/24/2022   US  LT BREAST BX W LOC DEV 1ST LESION IMG BX SPEC US  GUIDE 09/24/2022 [facility] MAMMOGRAPHY   CESAREAN SECTION     HERNIA  MESH REMOVAL     LAPAROSCOPIC BILATERAL SALPINGO OOPHERECTOMY Bilateral 06/10/2017   Procedure: LAPAROSCOPIC BILATERAL OOPHORECTOMY;  Surgeon: Edsel Norleen GAILS, MD;  Location: [facility];  Service: Gynecology;  Laterality: Bilateral;   POSTERIOR CERVICAL FUSION/FORAMINOTOMY Left 02/05/2019   Procedure: Posterior cervical fusion with lateral mass fixation - Cervical seven-Thoracic one with left foraminotomy/ diskectomy;  Surgeon: Joshua Alm RAMAN, MD;  Location: [facility] OR;  Service: Neurosurgery;  Laterality: Left;   SHOULDER ARTHROSCOPY WITH ROTATOR CUFF REPAIR AND SUBACROMIAL DECOMPRESSION  06/04/2012   Procedure: SHOULDER ARTHROSCOPY WITH ROTATOR CUFF REPAIR AND SUBACROMIAL DECOMPRESSION;  Surgeon: Toribio JULIANNA Chancy, MD;  Location: [facility];  Service: Orthopedics;  Laterality: Left;  LEFT ARTHROSCOPY SHOULDER DECOMPRESSION SUBACROMIAL PARTIAL ACROMIOPLASTY WITH CORACOACROMIAL RELEASE, DISTAL CLAVICULECTOMY,  ROTATOR CUFF REPAIR    TUBAL LIGATION      MEDICATIONS:  Current Medications[1]  ALLERGIES:  is allergic to anesthetics, amide; other; flexeril [cyclobenzaprine]; methocarbamol ; and singulair [montelukast].  FAMILY HISTORY: Family History  Problem Relation Age of Onset   Hyperlipidemia Mother    Thyroid  disease Mother    Heart attack Father    Dementia Maternal Aunt    Other Brother        suicide   Bipolar disorder Son    Cancer Other  Colon cancer Neg Hx    Colon polyps Neg Hx    Esophageal cancer Neg Hx    Rectal cancer Neg Hx    Stomach cancer Neg Hx    BRCA 1/2 Neg Hx    Breast cancer Neg Hx    EXAM:  Blood pressure 118/79, pulse 99, height 5' 4 (1.626 m), weight 153 lb 3.2 oz (69.5 kg), SpO2 96%. Constitutional: Good color, good hydration. VSS.  Head and Neck: No lymphadenopathy, thyromegaly or masses Chest: Normal breathing, Normal shape and excursion.  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 Skin pinch >2 cm bilaterally. Concerns for implant rupture on the right as it looks more deflated (unknown implant type). Superomedial incisions on bilateral areolas.  SN-N LEFT:   27   cm; RIGHT:   28   cm; IMF-N LEFT:   18  Cm, RIGHT:   17   Cm; BD LEFT:  16.5   cm;  RIGHT:   16   cm.    EXAM: DIGITAL SCREENING BILATERAL MAMMOGRAM WITH IMPLANTS, CAD AND TOMOSYNTHESIS   TECHNIQUE: Bilateral screening digital craniocaudal and mediolateral oblique mammograms were obtained. Bilateral screening digital breast tomosynthesis was performed. The images were evaluated with computer-aided detection. Standard and/or implant displaced views were performed.   COMPARISON:  Previous exam(s).   ACR Breast Density Category b: There are scattered areas of fibroglandular density.   FINDINGS: The patient has retroglandular implants. There are no findings suspicious for malignancy.   IMPRESSION: No mammographic evidence of malignancy. A result letter of this screening mammogram will be mailed directly to the patient.   RECOMMENDATION: Screening mammogram in one year. (Code:SM-B-01Y)   BI-RADS CATEGORY  1: Negative.     Electronically Signed   By: Dina  Arceo M.D.   On: 01/02/2024 10:23  ASSESSMENT/PLAN The risks, benefits, goals and alternatives of total capsulectomy were discussed in detail.  We discussed the differences between the types of capsulectomy.  We will perform a total capsulectomy that may involve cauterization and curettage of areas that are very stuck to the chest wall.  Capsules will be sent to pathology as removal of the implants.  We will obtain pictures of the implants and capsules.  If we see anything unusual we would send further testing but otherwise we would not do any other tests.  Risks of pneumothorax chest wall injury bleeding infection irregularity and cosmetic concerns requiring revisional surgery were discussed in detail.  Revisional surgery may require  financial burden.  Symmetry is often difficult once implants are removed.  Other risks of anesthesia including DVT PE and anesthetic complications were discussed also.  This operation if is somewhat from a breast augmentation in that it does require placement drains also and this was discussed.  Some patients require mastopexy.  The scars of the different types of mastopexy were discussed in detail. A fee estimate was provided, and patient financial responsibilities were discussed by our team. We obtained pictures today and will provide detailed quotes  Patient is also interested in replacement of implants, yet smaller size. The risks, benefits, goals and alternatives of breast implant replacement were explained to the patient in detail as follows: the surgery is intended to enlarge the breasts and is elective in nature. The final size and shape of the breast can only be estimated and ABSOLUTELY not guaranteed. Alternatives include no surgery or theuse of removable bra inserts. Since breast size and shape changes with  age and the implants have a limited life span, additional surgery should be anticipated in the future.  Breast implants (saline or silicone) can obscure some breast tissue on a mammogram, and theoretically may limit very early detection of breast cancer. Older implants have been associated with benign calcifications seen on mammography. There is a newer condition called ALCL which is kind of a lymphoma of the breast. It was described in detail and the risk it poses. We discussed that it is more common with textured implants. Surgical risks of breast implant surgery include bleeding, infection requiring removal of the implant without a guarantee of replacing it, asymmetry, about an 8% reported risk of loss of sensation of the nipple/areola complex, chronic pain, rupture, rippling, irregularities, need for additional surgery at additional expense, scarring, and capsular contracture.  If capsular  contracture occurs, removal of the capsule and placing a new implant may be recommended; this would be an additional expense, including the cost of new implants. BII was also discussed. Some patients are not happy even if the cosmetic result is good, as it may not meet  their expectations. The final size and shape of the breast cannot be guaranteed. The different types of implants were explained, with placement under or above the muscle with advantages and disadvantages of both. The FDA  MRI recommendations have been discussed. If an implant is determined to be ruptured, removal is recommended which may or may not be payed for. Insurance may or may not pay for a screening breast MRI.   We will obtain MRI for implant position and possible rupture (?). A fee estimate was provided, and patient financial responsibilities was discussed by our team. We obtained pictures today and will provide detailed quotes.  Plan: Bilateral breast partial vs. total capsulectomy, bilateral implant exchange, bilateral Mastopexy, liposuction trunk.  I spoke with the patient about possible need for free nipple grafts given the superomedial location of the previous breast augmentation incisions.  Patient expressed understanding and agrees with the plan.  Quentez Lober M. Laquitta Dominski, MD [facility]            [1]  Current Outpatient Medications:    VALIUM  5 MG tablet, Take 0.5 tablets (2.5 mg total) by mouth as directed. Take 1/2  tablet in the morning, 1/2 tablet in the afternoon and 1 tablet at night, Disp: 60 tablet, Rfl: 2

## 2024-05-20 NOTE — Addendum Note (Signed)
 Addended by: EUSTACIO POUR on: 05/20/2024 09:22 AM   Modules accepted: Orders

## 2024-06-01 ENCOUNTER — Encounter

## 2024-06-02 ENCOUNTER — Ambulatory Visit (HOSPITAL_COMMUNITY): Admission: RE | Admit: 2024-06-02 | Source: Ambulatory Visit

## 2024-06-08 ENCOUNTER — Ambulatory Visit (HOSPITAL_COMMUNITY): Admission: RE | Admit: 2024-06-08

## 2024-06-09 ENCOUNTER — Encounter

## 2024-06-18 ENCOUNTER — Ambulatory Visit (HOSPITAL_COMMUNITY)

## 2024-06-21 ENCOUNTER — Ambulatory Visit (HOSPITAL_COMMUNITY)

## 2024-06-23 ENCOUNTER — Other Ambulatory Visit
